# Patient Record
Sex: Female | Born: 1945
Health system: Southern US, Community
[De-identification: ages and names within clinical notes are randomized; demographics above are authoritative.]

## PROBLEM LIST (undated history)

## (undated) DIAGNOSIS — Z8049 Family history of malignant neoplasm of other genital organs: Secondary | ICD-10-CM

## (undated) DIAGNOSIS — S060X9A Concussion with loss of consciousness of unspecified duration, initial encounter: Secondary | ICD-10-CM

## (undated) DIAGNOSIS — Z803 Family history of malignant neoplasm of breast: Secondary | ICD-10-CM

## (undated) DIAGNOSIS — Z8041 Family history of malignant neoplasm of ovary: Secondary | ICD-10-CM

## (undated) DIAGNOSIS — R569 Unspecified convulsions: Secondary | ICD-10-CM

## (undated) DIAGNOSIS — Z800 Family history of malignant neoplasm of digestive organs: Secondary | ICD-10-CM

## (undated) DIAGNOSIS — Z8051 Family history of malignant neoplasm of kidney: Secondary | ICD-10-CM

## (undated) DIAGNOSIS — Z789 Other specified health status: Secondary | ICD-10-CM

## (undated) DIAGNOSIS — C801 Malignant (primary) neoplasm, unspecified: Secondary | ICD-10-CM

## (undated) DIAGNOSIS — E7800 Pure hypercholesterolemia, unspecified: Secondary | ICD-10-CM

## (undated) DIAGNOSIS — S060XAA Concussion with loss of consciousness status unknown, initial encounter: Secondary | ICD-10-CM

## (undated) HISTORY — DX: Family history of malignant neoplasm of ovary: Z80.41

## (undated) HISTORY — DX: Family history of malignant neoplasm of other genital organs: Z80.49

## (undated) HISTORY — PX: COLONOSCOPY: SHX174

## (undated) HISTORY — DX: Concussion with loss of consciousness status unknown, initial encounter: S06.0XAA

## (undated) HISTORY — DX: Family history of malignant neoplasm of kidney: Z80.51

## (undated) HISTORY — PX: CHOLECYSTECTOMY: SHX55

## (undated) HISTORY — DX: Family history of malignant neoplasm of digestive organs: Z80.0

## (undated) HISTORY — DX: Pure hypercholesterolemia, unspecified: E78.00

## (undated) HISTORY — DX: Malignant (primary) neoplasm, unspecified: C80.1

## (undated) HISTORY — DX: Family history of malignant neoplasm of breast: Z80.3

## (undated) HISTORY — DX: Concussion with loss of consciousness of unspecified duration, initial encounter: S06.0X9A

---

## 2001-01-29 ENCOUNTER — Encounter: Payer: Self-pay | Admitting: *Deleted

## 2001-01-29 ENCOUNTER — Emergency Department (HOSPITAL_COMMUNITY): Admission: EM | Admit: 2001-01-29 | Discharge: 2001-01-29 | Payer: Self-pay | Admitting: *Deleted

## 2001-10-07 ENCOUNTER — Other Ambulatory Visit: Admission: RE | Admit: 2001-10-07 | Discharge: 2001-10-07 | Payer: Self-pay | Admitting: Internal Medicine

## 2001-10-23 ENCOUNTER — Encounter: Payer: Self-pay | Admitting: Internal Medicine

## 2001-10-23 ENCOUNTER — Ambulatory Visit (HOSPITAL_COMMUNITY): Admission: RE | Admit: 2001-10-23 | Discharge: 2001-10-23 | Payer: Self-pay | Admitting: Internal Medicine

## 2001-11-13 ENCOUNTER — Ambulatory Visit (HOSPITAL_COMMUNITY): Admission: RE | Admit: 2001-11-13 | Discharge: 2001-11-13 | Payer: Self-pay | Admitting: Internal Medicine

## 2001-11-13 ENCOUNTER — Encounter: Payer: Self-pay | Admitting: Internal Medicine

## 2002-10-26 ENCOUNTER — Ambulatory Visit (HOSPITAL_COMMUNITY): Admission: RE | Admit: 2002-10-26 | Discharge: 2002-10-26 | Payer: Self-pay | Admitting: Internal Medicine

## 2002-10-26 ENCOUNTER — Encounter: Payer: Self-pay | Admitting: Internal Medicine

## 2003-11-08 ENCOUNTER — Encounter: Admission: RE | Admit: 2003-11-08 | Discharge: 2003-11-08 | Payer: Self-pay | Admitting: Family Medicine

## 2004-10-27 ENCOUNTER — Ambulatory Visit (HOSPITAL_COMMUNITY): Admission: RE | Admit: 2004-10-27 | Discharge: 2004-10-27 | Payer: Self-pay | Admitting: Family Medicine

## 2004-11-30 ENCOUNTER — Ambulatory Visit (HOSPITAL_COMMUNITY): Admission: RE | Admit: 2004-11-30 | Discharge: 2004-11-30 | Payer: Self-pay | Admitting: General Surgery

## 2005-05-10 ENCOUNTER — Ambulatory Visit (HOSPITAL_COMMUNITY): Admission: RE | Admit: 2005-05-10 | Discharge: 2005-05-10 | Payer: Self-pay | Admitting: General Surgery

## 2007-06-12 ENCOUNTER — Ambulatory Visit (HOSPITAL_COMMUNITY): Admission: RE | Admit: 2007-06-12 | Discharge: 2007-06-12 | Payer: Self-pay | Admitting: Family Medicine

## 2008-06-21 ENCOUNTER — Ambulatory Visit (HOSPITAL_COMMUNITY): Admission: RE | Admit: 2008-06-21 | Discharge: 2008-06-21 | Payer: Self-pay | Admitting: Internal Medicine

## 2010-10-16 ENCOUNTER — Encounter: Payer: Self-pay | Admitting: Internal Medicine

## 2011-02-09 NOTE — H&P (Signed)
Darlene Moore, GRITZ              ACCOUNT NO.:  [id number]   MEDICAL RECORD NO.:  [id number]          PATIENT TYPE:  AMB   LOCATION:  DAY                           FACILITY:  [facility]   PHYSICIAN:  Jerolyn Shin C. Katrinka Blazing, M.D.   DATE OF BIRTH:  30-Aug-1946   DATE OF ADMISSION:  DATE OF DISCHARGE:  LH                                HISTORY & PHYSICAL   HISTORY OF PRESENT ILLNESS:  A 65 year old female with a history of guaiac  positive stool on physical exam.  There is no history of rectal bleeding.  She denies constipation.  There is no family history of colon cancer.   PAST HISTORY:  Negative, except for seasonal allergies.   SURGERY:  Cholecystectomy for gangrenous cholecystitis.   MEDICATIONS:  None.   FAMILY HISTORY:  Positive for breast cancer, atherosclerotic heart disease,  and some type of intestinal cancer.   PHYSICAL EXAMINATION:  VITAL SIGNS:  Blood pressure 110/80, pulse 68,  respirations 18, weight 206 pounds.  HEENT:  Unremarkable.  NECK:  Supple.  No JVD, bruit, adenopathy, or thyromegaly.  CHEST:  Clear to auscultation.  HEART:  Regular rate and rhythm without murmur, gallop, or rub.  ABDOMEN:  Soft, nontender.  No masses.  RECTAL:  Normal external and internal exam.  Stool guaiac positive.  EXTREMITIES:  No clubbing, cyanosis, or edema.  NEUROLOGIC:  No focal motor, sensory, or cerebellar deficit.   IMPRESSION:  1.  Guaiac positive stools.  2.  Family history of intestinal cancer, type undefined.   PLAN:  Screening colonoscopy.      LCS/MEDQ  D:  11/30/2004  T:  11/30/2004  Job:  [id number]

## 2012-05-07 ENCOUNTER — Other Ambulatory Visit (HOSPITAL_COMMUNITY): Payer: Self-pay | Admitting: Internal Medicine

## 2012-05-07 DIAGNOSIS — Z139 Encounter for screening, unspecified: Secondary | ICD-10-CM

## 2012-05-13 ENCOUNTER — Ambulatory Visit (HOSPITAL_COMMUNITY)
Admission: RE | Admit: 2012-05-13 | Discharge: 2012-05-13 | Disposition: A | Discharge status: Home / Self Care | Payer: BC Managed Care – PPO | Source: Ambulatory Visit | Attending: Internal Medicine | Admitting: Internal Medicine

## 2012-05-13 DIAGNOSIS — Z139 Encounter for screening, unspecified: Secondary | ICD-10-CM

## 2012-05-13 DIAGNOSIS — Z1231 Encounter for screening mammogram for malignant neoplasm of breast: Secondary | ICD-10-CM | POA: Insufficient documentation

## 2012-05-21 ENCOUNTER — Ambulatory Visit: Payer: Self-pay | Admitting: Cardiology

## 2013-06-25 ENCOUNTER — Other Ambulatory Visit (HOSPITAL_COMMUNITY): Payer: Self-pay | Admitting: Internal Medicine

## 2013-06-25 DIAGNOSIS — Z139 Encounter for screening, unspecified: Secondary | ICD-10-CM

## 2013-06-29 ENCOUNTER — Ambulatory Visit (HOSPITAL_COMMUNITY)
Admission: RE | Admit: 2013-06-29 | Discharge: 2013-06-29 | Disposition: A | Discharge status: Home / Self Care | Payer: Medicare Other | Source: Ambulatory Visit | Attending: Internal Medicine | Admitting: Internal Medicine

## 2013-06-29 DIAGNOSIS — Z139 Encounter for screening, unspecified: Secondary | ICD-10-CM

## 2013-06-29 DIAGNOSIS — Z1231 Encounter for screening mammogram for malignant neoplasm of breast: Secondary | ICD-10-CM | POA: Insufficient documentation

## 2013-12-15 DIAGNOSIS — H6990 Unspecified Eustachian tube disorder, unspecified ear: Secondary | ICD-10-CM | POA: Insufficient documentation

## 2014-05-26 ENCOUNTER — Other Ambulatory Visit (HOSPITAL_COMMUNITY): Payer: Self-pay | Admitting: Internal Medicine

## 2014-05-26 DIAGNOSIS — Z1231 Encounter for screening mammogram for malignant neoplasm of breast: Secondary | ICD-10-CM

## 2014-07-05 ENCOUNTER — Ambulatory Visit (HOSPITAL_COMMUNITY)
Admission: RE | Admit: 2014-07-05 | Discharge: 2014-07-05 | Disposition: A | Discharge status: Home / Self Care | Payer: Medicare Other | Source: Ambulatory Visit | Attending: Internal Medicine | Admitting: Internal Medicine

## 2014-07-05 DIAGNOSIS — Z1231 Encounter for screening mammogram for malignant neoplasm of breast: Secondary | ICD-10-CM | POA: Diagnosis present

## 2014-09-02 ENCOUNTER — Encounter (INDEPENDENT_AMBULATORY_CARE_PROVIDER_SITE_OTHER): Payer: Self-pay | Admitting: *Deleted

## 2015-06-20 ENCOUNTER — Other Ambulatory Visit (HOSPITAL_COMMUNITY): Payer: Self-pay | Admitting: Internal Medicine

## 2015-06-20 DIAGNOSIS — Z1231 Encounter for screening mammogram for malignant neoplasm of breast: Secondary | ICD-10-CM

## 2015-06-29 ENCOUNTER — Encounter (INDEPENDENT_AMBULATORY_CARE_PROVIDER_SITE_OTHER): Payer: Self-pay | Admitting: *Deleted

## 2015-07-13 ENCOUNTER — Ambulatory Visit (HOSPITAL_COMMUNITY): Payer: Self-pay

## 2015-07-14 ENCOUNTER — Other Ambulatory Visit (HOSPITAL_COMMUNITY): Payer: Self-pay | Admitting: Internal Medicine

## 2015-07-14 DIAGNOSIS — Z1231 Encounter for screening mammogram for malignant neoplasm of breast: Secondary | ICD-10-CM

## 2015-07-15 ENCOUNTER — Encounter (INDEPENDENT_AMBULATORY_CARE_PROVIDER_SITE_OTHER): Payer: Self-pay | Admitting: *Deleted

## 2015-07-18 ENCOUNTER — Ambulatory Visit (HOSPITAL_COMMUNITY)
Admission: RE | Admit: 2015-07-18 | Discharge: 2015-07-18 | Disposition: A | Discharge status: Home / Self Care | Payer: Medicare Other | Source: Ambulatory Visit | Attending: Internal Medicine | Admitting: Internal Medicine

## 2015-07-18 DIAGNOSIS — M8580 Other specified disorders of bone density and structure, unspecified site: Secondary | ICD-10-CM | POA: Insufficient documentation

## 2015-07-18 DIAGNOSIS — Z780 Asymptomatic menopausal state: Secondary | ICD-10-CM | POA: Insufficient documentation

## 2015-07-18 DIAGNOSIS — Z1231 Encounter for screening mammogram for malignant neoplasm of breast: Secondary | ICD-10-CM | POA: Insufficient documentation

## 2015-07-18 DIAGNOSIS — Z13820 Encounter for screening for osteoporosis: Secondary | ICD-10-CM | POA: Insufficient documentation

## 2015-07-21 ENCOUNTER — Other Ambulatory Visit (INDEPENDENT_AMBULATORY_CARE_PROVIDER_SITE_OTHER): Payer: Self-pay | Admitting: *Deleted

## 2015-07-21 ENCOUNTER — Encounter (INDEPENDENT_AMBULATORY_CARE_PROVIDER_SITE_OTHER): Payer: Self-pay | Admitting: *Deleted

## 2015-07-21 DIAGNOSIS — Z1211 Encounter for screening for malignant neoplasm of colon: Secondary | ICD-10-CM

## 2015-08-02 ENCOUNTER — Ambulatory Visit: Payer: Self-pay | Admitting: Allergy and Immunology

## 2015-08-31 ENCOUNTER — Telehealth (INDEPENDENT_AMBULATORY_CARE_PROVIDER_SITE_OTHER): Payer: Self-pay | Admitting: *Deleted

## 2015-08-31 DIAGNOSIS — Z1211 Encounter for screening for malignant neoplasm of colon: Secondary | ICD-10-CM

## 2015-08-31 MED ORDER — PEG 3350-KCL-NA BICARB-NACL 420 G PO SOLR: 4000.0000 mL | Freq: Once | ORAL | Status: DC

## 2015-08-31 NOTE — Telephone Encounter (Signed)
Referring MD/PCP: hall   Procedure: tcs  Reason/Indication:  wcreening  Has patient had this procedure before?  Yes, 10 yrs ago  If so, when, by whom and where?    Is there a family history of colon cancer?  no  Who?  What age when diagnosed?    Is patient diabetic?   no      Does patient have prosthetic heart valve or mechanical valve?  no  Do you have a pacemaker?  no  Has patient ever had endocarditis? no  Has patient had joint replacement within last 12 months?  no  Does patient tend to be constipated or take laxatives? no  Does patient have a history of alcohol/drug use?  no  Is patient on Coumadin, Plavix and/or Aspirin? no  Medications: advil nightly, multi vit, probiotic  Allergies: nkda  Medication Adjustment:   Procedure date & time: 09/29/15 at 730

## 2015-08-31 NOTE — Telephone Encounter (Signed)
Patient needs trilyte 

## 2015-08-31 NOTE — Telephone Encounter (Signed)
agree

## 2015-09-29 ENCOUNTER — Ambulatory Visit (HOSPITAL_COMMUNITY)
Admission: RE | Admit: 2015-09-29 | Discharge: 2015-09-29 | Disposition: A | Discharge status: Home / Self Care | Payer: Medicare Other | Source: Ambulatory Visit | Attending: Internal Medicine | Admitting: Internal Medicine

## 2015-09-29 ENCOUNTER — Encounter (HOSPITAL_COMMUNITY): Payer: Self-pay | Admitting: *Deleted

## 2015-09-29 ENCOUNTER — Encounter (HOSPITAL_COMMUNITY)
Admission: RE | Disposition: A | Discharge status: Home / Self Care | Payer: Self-pay | Source: Ambulatory Visit | Attending: Internal Medicine

## 2015-09-29 DIAGNOSIS — K644 Residual hemorrhoidal skin tags: Secondary | ICD-10-CM | POA: Insufficient documentation

## 2015-09-29 DIAGNOSIS — K648 Other hemorrhoids: Secondary | ICD-10-CM | POA: Diagnosis not present

## 2015-09-29 DIAGNOSIS — Z1211 Encounter for screening for malignant neoplasm of colon: Secondary | ICD-10-CM | POA: Insufficient documentation

## 2015-09-29 HISTORY — DX: Other specified health status: Z78.9

## 2015-09-29 HISTORY — PX: COLONOSCOPY: SHX5424

## 2015-09-29 SURGERY — COLONOSCOPY
Anesthesia: Moderate Sedation

## 2015-09-29 MED ORDER — SODIUM CHLORIDE 0.9 % IV SOLN
INTRAVENOUS | Status: DC
  Administered 2015-09-29: 08:00:00 via INTRAVENOUS

## 2015-09-29 MED ORDER — MIDAZOLAM HCL 5 MG/5ML IJ SOLN
INTRAMUSCULAR | Status: DC | PRN
  Administered 2015-09-29: 2 mg via INTRAVENOUS
  Administered 2015-09-29: 1 mg via INTRAVENOUS
  Administered 2015-09-29: 2 mg via INTRAVENOUS

## 2015-09-29 MED ORDER — MEPERIDINE HCL 50 MG/ML IJ SOLN
INTRAMUSCULAR | Status: DC | PRN
  Administered 2015-09-29 (×2): 25 mg via INTRAVENOUS

## 2015-09-29 MED ORDER — MEPERIDINE HCL 50 MG/ML IJ SOLN
INTRAMUSCULAR | Status: AC
  Filled 2015-09-29: qty 1

## 2015-09-29 MED ORDER — MIDAZOLAM HCL 5 MG/5ML IJ SOLN
INTRAMUSCULAR | Status: AC
  Filled 2015-09-29: qty 10

## 2015-09-29 MED ORDER — STERILE WATER FOR IRRIGATION IR SOLN
Status: DC | PRN
  Administered 2015-09-29: 09:00:00

## 2015-09-29 NOTE — Op Note (Addendum)
COLONOSCOPY PROCEDURE REPORT  PATIENT:  Darlene Moore  MR#:  [id number] Birthdate:  Oct 20, 1945, 70 y.o., female Endoscopist:  Dr. Rogene Houston, MD Referred By:  Dr. Delphina Cahill, MD Procedure Date: 09/29/2015  Procedure:   Colonoscopy  Indications:  Average risk screening colonoscopy.                        Last exam was normal 10 years ago.  Informed Consent:  The procedure and risks were reviewed with the patient and informed consent was obtained.  Medications:  Demerol 50 mg IV Versed 5 mg IV Moderate sedation started @8 :57 A.M. Procedure ended and scope out 9:21 A.M.  Description of procedure:  After a digital rectal exam was performed, that colonoscope was advanced from the anus through the rectum and colon to the area of the cecum, ileocecal valve and appendiceal orifice. The cecum was deeply intubated. These structures were well-seen and photographed for the record. From the level of the cecum and ileocecal valve, the scope was slowly and cautiously withdrawn. The mucosal surfaces were carefully surveyed utilizing scope tip to flexion to facilitate fold flattening as needed. The scope was pulled down into the rectum where a thorough exam including retroflexion was performed.  Findings:   Prep satisfactory. Normal mucosa of cecum, ascending colon, hepatic flexure, transverse colon, splenic flexure, descending and sigmoid colon. Normal rectal mucosa. Wall hemorrhoids below the dentate line.   Therapeutic/Diagnostic Maneuvers Performed:   None  Complications:  None  EBL: None  Cecal Withdrawal Time:  8 minutes  Impression:  Normal colonoscopy except small external hemorrhoids.  Recommendations:  Standard instructions given. Next screening exam in 10 years.  Darlene Moore,Darlene Moore  09/29/2015 9:27 AM  CC: Dr. Wende Neighbors, MD & Dr. Rayne Du ref. provider found

## 2015-09-29 NOTE — Discharge Instructions (Signed)
Resume usual medications and diet. °No driving for 24 hours. °Next screening exam in 10 years. ° ° ° ° °Colonoscopy, Care After °These instructions give you information on caring for yourself after your procedure. Your doctor may also give you more specific instructions. Call your doctor if you have any problems or questions after your procedure. °HOME CARE °· Do not drive for 24 hours. °· Do not sign important papers or use machinery for 24 hours. °· You may shower. °· You may go back to your usual activities, but go slower for the first 24 hours. °· Take rest breaks often during the first 24 hours. °· Walk around or use warm packs on your belly (abdomen) if you have belly cramping or gas. °· Drink enough fluids to keep your pee (urine) clear or pale yellow. °· Resume your normal diet. Avoid heavy or fried foods. °· Avoid drinking alcohol for 24 hours or as told by your doctor. °· Only take medicines as told by your doctor. °If a tissue sample (biopsy) was taken during the procedure:  °· Do not take aspirin or blood thinners for 7 days, or as told by your doctor. °· Do not drink alcohol for 7 days, or as told by your doctor. °· Eat soft foods for the first 24 hours. °GET HELP IF: °You still have a small amount of blood in your poop (stool) 2-3 days after the procedure. °GET HELP RIGHT AWAY IF: °· You have more than a small amount of blood in your poop. °· You see clumps of tissue (blood clots) in your poop. °· Your belly is puffy (swollen). °· You feel sick to your stomach (nauseous) or throw up (vomit). °· You have a fever. °· You have belly pain that gets worse and medicine does not help. °MAKE SURE YOU: °· Understand these instructions. °· Will watch your condition. °· Will get help right away if you are not doing well or get worse. °  °This information is not intended to replace advice given to you by your health care provider. Make sure you discuss any questions you have with your health care provider. °    °Document Released: 10/13/2010 Document Revised: 09/15/2013 Document Reviewed: 05/18/2013 °Elsevier Interactive Patient Education ©2016 [employer] ° ° ° ° ° °Hemorrhoids °Hemorrhoids are swollen veins around the rectum or anus. There are two types of hemorrhoids:  °· Internal hemorrhoids. These occur in the veins just inside the rectum. They may poke through to the outside and become irritated and painful. °· External hemorrhoids. These occur in the veins outside the anus and can be felt as a painful swelling or hard lump near the anus. °CAUSES °· Pregnancy.   °· Obesity.   °· Constipation or diarrhea.   °· Straining to have a bowel movement.   °· Sitting for long periods on the toilet. °· Heavy lifting or other activity that caused you to strain. °· Anal intercourse. °SYMPTOMS  °· Pain.   °· Anal itching or irritation.   °· Rectal bleeding.   °· Fecal leakage.   °· Anal swelling.   °· One or more lumps around the anus.   °DIAGNOSIS  °Your caregiver may be able to diagnose hemorrhoids by visual examination. Other examinations or tests that may be performed include:  °· Examination of the rectal area with a gloved hand (digital rectal exam).   °· Examination of anal canal using a small tube (scope).   °· A blood test if you have lost a significant amount of blood. °· A test to look inside the colon (  sigmoidoscopy or colonoscopy). °TREATMENT °Most hemorrhoids can be treated at home. However, if symptoms do not seem to be getting better or if you have a lot of rectal bleeding, your caregiver may perform a procedure to help make the hemorrhoids get smaller or remove them completely. Possible treatments include:  °· Placing a rubber band at the base of the hemorrhoid to cut off the circulation (rubber band ligation).   °· Injecting a chemical to shrink the hemorrhoid (sclerotherapy).   °· Using a tool to burn the hemorrhoid (infrared light therapy).   °· Surgically removing the hemorrhoid (hemorrhoidectomy).    °· Stapling the hemorrhoid to block blood flow to the tissue (hemorrhoid stapling).   °HOME CARE INSTRUCTIONS  °· Eat foods with fiber, such as whole grains, beans, nuts, fruits, and vegetables. Ask your doctor about taking products with added fiber in them (fiber supplements). °· Increase fluid intake. Drink enough water and fluids to keep your urine clear or pale yellow.   °· Exercise regularly.   °· Go to the bathroom when you have the urge to have a bowel movement. Do not wait.   °· Avoid straining to have bowel movements.   °· Keep the anal area dry and clean. Use wet toilet paper or moist towelettes after a bowel movement.   °· Medicated creams and suppositories may be used or applied as directed.   °· Only take over-the-counter or prescription medicines as directed by your caregiver.   °· Take warm sitz baths for 15-20 minutes, 3-4 times a day to ease pain and discomfort.   °· Place ice packs on the hemorrhoids if they are tender and swollen. Using ice packs between sitz baths may be helpful.   °¨ Put ice in a plastic bag.   °¨ Place a towel between your skin and the bag.   °¨ Leave the ice on for 15-20 minutes, 3-4 times a day.   °· Do not use a donut-shaped pillow or sit on the toilet for long periods. This increases blood pooling and pain.   °SEEK MEDICAL CARE IF: °· You have increasing pain and swelling that is not controlled by treatment or medicine. °· You have uncontrolled bleeding. °· You have difficulty or you are unable to have a bowel movement. °· You have pain or inflammation outside the area of the hemorrhoids. °MAKE SURE YOU: °· Understand these instructions. °· Will watch your condition. °· Will get help right away if you are not doing well or get worse. °  °This information is not intended to replace advice given to you by your health care provider. Make sure you discuss any questions you have with your health care provider. °  °Document Released: 09/07/2000 Document Revised: 08/27/2012  Document Reviewed: 07/15/2012 °Elsevier Interactive Patient Education ©2016 [employer] ° °

## 2015-09-29 NOTE — H&P (Signed)
Darlene Moore is an 70 y.o. female.   Chief Complaint: Patient is here for colonoscopy. HPI: Patient is 70 year old Caucasian female who is here for screening colonoscopy. She denies abdominal pain change in bowel habits or rectal bleeding. Last colonoscopy was normal 10 years ago. Family history is negative for CRC.  Past Medical History  Diagnosis Date  . Medical history non-contributory     Past Surgical History  Procedure Laterality Date  . Cholecystectomy    . Colonoscopy      History reviewed. No pertinent family history. Social History:  reports that she has never smoked. She does not have any smokeless tobacco history on file. She reports that she does not drink alcohol or use illicit drugs.  Allergies: No Known Allergies  Medications Prior to Admission  Medication Sig Dispense Refill  . ibuprofen (ADVIL,MOTRIN) 200 MG tablet Take 200 mg by mouth every 6 (six) hours as needed for mild pain.    . Multiple Vitamin (MULTIVITAMIN) tablet Take 1 tablet by mouth daily.    . polyethylene glycol-electrolytes (NULYTELY/GOLYTELY) 420 G solution Take 4,000 mLs by mouth once. 4000 mL 0  . Probiotic Product (PROBIOTIC PO) Take 1 capsule by mouth daily.      No results found for this or any previous visit (from the past 48 hour(s)). No results found.  ROS  Blood pressure 138/65, pulse 57, temperature 97.9 F (36.6 C), temperature source Oral, resp. rate 15, height 5\' 7"  (1.702 m), weight 214 lb (97.07 kg), SpO2 98 %. Physical Exam  Constitutional: She appears well-developed and well-nourished.  HENT:  Mouth/Throat: Oropharynx is clear and moist.  Eyes: Conjunctivae are normal. No scleral icterus.  Neck: No thyromegaly present.  Cardiovascular: Normal rate, regular rhythm and normal heart sounds.   No murmur heard. Respiratory: Effort normal and breath sounds normal.  GI: Soft. She exhibits no distension and no mass. There is no tenderness.  Musculoskeletal: She exhibits no  edema.  Lymphadenopathy:    She has no cervical adenopathy.  Neurological: She is alert.  Skin: Skin is warm and dry.     Assessment/Plan Average risk screening colonoscopy.  REHMAN,NAJEEB U 09/29/2015, 8:53 AM

## 2015-10-03 ENCOUNTER — Encounter (HOSPITAL_COMMUNITY): Payer: Self-pay | Admitting: Internal Medicine

## 2016-01-17 ENCOUNTER — Other Ambulatory Visit: Payer: Self-pay | Admitting: Neurology

## 2016-01-17 DIAGNOSIS — R569 Unspecified convulsions: Secondary | ICD-10-CM

## 2016-01-25 ENCOUNTER — Ambulatory Visit (HOSPITAL_COMMUNITY)
Admission: RE | Admit: 2016-01-25 | Discharge: 2016-01-25 | Disposition: A | Discharge status: Home / Self Care | Payer: Medicare Other | Source: Ambulatory Visit | Attending: Neurology | Admitting: Neurology

## 2016-01-25 DIAGNOSIS — R569 Unspecified convulsions: Secondary | ICD-10-CM | POA: Insufficient documentation

## 2016-01-25 DIAGNOSIS — R9089 Other abnormal findings on diagnostic imaging of central nervous system: Secondary | ICD-10-CM | POA: Insufficient documentation

## 2016-01-25 MED ORDER — GADOBENATE DIMEGLUMINE 529 MG/ML IV SOLN
20.0000 mL | Freq: Once | INTRAVENOUS | Status: AC | PRN
  Administered 2016-01-25: 20 mL via INTRAVENOUS

## 2016-06-26 ENCOUNTER — Other Ambulatory Visit (HOSPITAL_COMMUNITY): Payer: Self-pay | Admitting: Internal Medicine

## 2016-06-26 DIAGNOSIS — Z1231 Encounter for screening mammogram for malignant neoplasm of breast: Secondary | ICD-10-CM

## 2016-07-19 ENCOUNTER — Ambulatory Visit (HOSPITAL_COMMUNITY)
Admission: RE | Admit: 2016-07-19 | Discharge: 2016-07-19 | Disposition: A | Discharge status: Home / Self Care | Payer: Medicare Other | Source: Ambulatory Visit | Attending: Internal Medicine | Admitting: Internal Medicine

## 2016-07-19 DIAGNOSIS — Z1231 Encounter for screening mammogram for malignant neoplasm of breast: Secondary | ICD-10-CM | POA: Insufficient documentation

## 2017-05-07 ENCOUNTER — Other Ambulatory Visit: Payer: Self-pay | Admitting: Neurology

## 2017-05-07 DIAGNOSIS — G459 Transient cerebral ischemic attack, unspecified: Secondary | ICD-10-CM

## 2017-05-22 ENCOUNTER — Ambulatory Visit (HOSPITAL_COMMUNITY)
Admission: RE | Admit: 2017-05-22 | Discharge: 2017-05-22 | Disposition: A | Discharge status: Home / Self Care | Payer: Medicare Other | Source: Ambulatory Visit | Attending: Neurology | Admitting: Neurology

## 2017-05-22 DIAGNOSIS — I6523 Occlusion and stenosis of bilateral carotid arteries: Secondary | ICD-10-CM | POA: Insufficient documentation

## 2017-05-22 DIAGNOSIS — G459 Transient cerebral ischemic attack, unspecified: Secondary | ICD-10-CM | POA: Diagnosis present

## 2017-06-06 ENCOUNTER — Other Ambulatory Visit (HOSPITAL_COMMUNITY): Payer: Self-pay | Admitting: Internal Medicine

## 2017-06-06 DIAGNOSIS — Z1231 Encounter for screening mammogram for malignant neoplasm of breast: Secondary | ICD-10-CM

## 2017-07-22 ENCOUNTER — Ambulatory Visit (HOSPITAL_COMMUNITY)
Admission: RE | Admit: 2017-07-22 | Discharge: 2017-07-22 | Disposition: A | Discharge status: Home / Self Care | Payer: Medicare Other | Source: Ambulatory Visit | Attending: Internal Medicine | Admitting: Internal Medicine

## 2017-07-22 ENCOUNTER — Encounter (HOSPITAL_COMMUNITY): Payer: Self-pay

## 2017-07-22 DIAGNOSIS — Z1231 Encounter for screening mammogram for malignant neoplasm of breast: Secondary | ICD-10-CM | POA: Diagnosis not present

## 2017-07-27 ENCOUNTER — Encounter (HOSPITAL_COMMUNITY): Payer: Self-pay | Admitting: Emergency Medicine

## 2017-07-27 ENCOUNTER — Emergency Department (HOSPITAL_COMMUNITY): Payer: No Typology Code available for payment source

## 2017-07-27 ENCOUNTER — Emergency Department (HOSPITAL_COMMUNITY)
Admission: EM | Admit: 2017-07-27 | Discharge: 2017-07-27 | Disposition: A | Discharge status: Home / Self Care | Payer: No Typology Code available for payment source | Attending: Emergency Medicine | Admitting: Emergency Medicine

## 2017-07-27 DIAGNOSIS — R51 Headache: Secondary | ICD-10-CM | POA: Insufficient documentation

## 2017-07-27 DIAGNOSIS — M542 Cervicalgia: Secondary | ICD-10-CM

## 2017-07-27 DIAGNOSIS — Y999 Unspecified external cause status: Secondary | ICD-10-CM | POA: Insufficient documentation

## 2017-07-27 DIAGNOSIS — M545 Low back pain, unspecified: Secondary | ICD-10-CM

## 2017-07-27 DIAGNOSIS — S129XXA Fracture of neck, unspecified, initial encounter: Secondary | ICD-10-CM | POA: Insufficient documentation

## 2017-07-27 DIAGNOSIS — S199XXA Unspecified injury of neck, initial encounter: Secondary | ICD-10-CM | POA: Diagnosis present

## 2017-07-27 DIAGNOSIS — Z79899 Other long term (current) drug therapy: Secondary | ICD-10-CM | POA: Insufficient documentation

## 2017-07-27 DIAGNOSIS — Y92410 Unspecified street and highway as the place of occurrence of the external cause: Secondary | ICD-10-CM | POA: Insufficient documentation

## 2017-07-27 DIAGNOSIS — Y9389 Activity, other specified: Secondary | ICD-10-CM | POA: Diagnosis not present

## 2017-07-27 HISTORY — DX: Unspecified convulsions: R56.9

## 2017-07-27 LAB — CBC
HCT: 42.1 % (ref 36.0–46.0)
HEMOGLOBIN: 14.1 g/dL (ref 12.0–15.0)
MCH: 30.7 pg (ref 26.0–34.0)
MCHC: 33.5 g/dL (ref 30.0–36.0)
MCV: 91.7 fL (ref 78.0–100.0)
Platelets: 168 10*3/uL (ref 150–400)
RBC: 4.59 MIL/uL (ref 3.87–5.11)
RDW: 12.7 % (ref 11.5–15.5)
WBC: 7.8 10*3/uL (ref 4.0–10.5)

## 2017-07-27 LAB — BASIC METABOLIC PANEL
Anion gap: 9 (ref 5–15)
BUN: 23 mg/dL — AB (ref 6–20)
CALCIUM: 9.6 mg/dL (ref 8.9–10.3)
CHLORIDE: 104 mmol/L (ref 101–111)
CO2: 28 mmol/L (ref 22–32)
CREATININE: 0.88 mg/dL (ref 0.44–1.00)
GFR calc Af Amer: >60 mL/min (ref >60)
GFR calc non Af Amer: >60 mL/min (ref >60)
GLUCOSE: 94 mg/dL (ref 65–99)
Potassium: 4.1 mmol/L (ref 3.5–5.1)
Sodium: 141 mmol/L (ref 135–145)

## 2017-07-27 MED ORDER — IOPAMIDOL (ISOVUE-300) INJECTION 61%
100.0000 mL | Freq: Once | INTRAVENOUS | Status: AC | PRN
  Administered 2017-07-27: 100 mL via INTRAVENOUS

## 2017-07-27 MED ORDER — SODIUM CHLORIDE 0.9 % IV BOLUS (SEPSIS)
250.0000 mL | Freq: Once | INTRAVENOUS | Status: AC
  Administered 2017-07-27: 250 mL via INTRAVENOUS

## 2017-07-27 MED ORDER — HYDROCODONE-ACETAMINOPHEN 5-325 MG PO TABS: 1.0000 | ORAL_TABLET | Freq: Four times a day (QID) | ORAL | 0 refills | Status: DC | PRN

## 2017-07-27 MED ORDER — SODIUM CHLORIDE 0.9 % IV SOLN: INTRAVENOUS | Status: DC

## 2017-07-27 NOTE — Discharge Instructions (Signed)
Follow-up with orthopedics.  Take pain medicine as needed.  Expect to be sore and stiff the next 2 days if not longer.  CT of head neck chest abdomen and pelvis only with the finding of the cervical transverse process nondisplaced fracture.  This should be a stable fracture and should heal.
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
## 2017-08-06 ENCOUNTER — Ambulatory Visit (INDEPENDENT_AMBULATORY_CARE_PROVIDER_SITE_OTHER): Payer: Medicare Other

## 2017-08-06 ENCOUNTER — Ambulatory Visit: Payer: Medicare Other | Admitting: Orthopedic Surgery

## 2017-08-06 ENCOUNTER — Encounter: Payer: Self-pay | Admitting: Orthopedic Surgery

## 2017-08-06 ENCOUNTER — Other Ambulatory Visit: Payer: Self-pay | Admitting: Orthopedic Surgery

## 2017-08-06 VITALS — BP 122/80 | HR 55 | Ht 67.0 in | Wt 196.0 lb

## 2017-08-06 DIAGNOSIS — S12501A Unspecified nondisplaced fracture of sixth cervical vertebra, initial encounter for closed fracture: Secondary | ICD-10-CM

## 2017-08-06 MED ORDER — HYDROCODONE-ACETAMINOPHEN 5-325 MG PO TABS: 1.0000 | ORAL_TABLET | Freq: Four times a day (QID) | ORAL | 0 refills | Status: DC | PRN
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
## 2017-08-21 ENCOUNTER — Other Ambulatory Visit: Payer: Self-pay | Admitting: Neurology

## 2017-08-21 DIAGNOSIS — G459 Transient cerebral ischemic attack, unspecified: Secondary | ICD-10-CM

## 2017-08-28 ENCOUNTER — Ambulatory Visit (HOSPITAL_COMMUNITY)
Admission: RE | Admit: 2017-08-28 | Discharge: 2017-08-28 | Disposition: A | Discharge status: Home / Self Care | Payer: Medicare Other | Source: Ambulatory Visit | Attending: Neurology | Admitting: Neurology

## 2017-08-28 DIAGNOSIS — G459 Transient cerebral ischemic attack, unspecified: Secondary | ICD-10-CM

## 2017-09-18 DIAGNOSIS — S12501D Unspecified nondisplaced fracture of sixth cervical vertebra, subsequent encounter for fracture with routine healing: Secondary | ICD-10-CM | POA: Insufficient documentation

## 2017-09-25 ENCOUNTER — Ambulatory Visit: Payer: Medicare Other | Admitting: Orthopedic Surgery

## 2017-11-18 DIAGNOSIS — G40219 Localization-related (focal) (partial) symptomatic epilepsy and epileptic syndromes with complex partial seizures, intractable, without status epilepticus: Secondary | ICD-10-CM | POA: Diagnosis not present

## 2017-11-18 DIAGNOSIS — H9193 Unspecified hearing loss, bilateral: Secondary | ICD-10-CM | POA: Diagnosis not present

## 2017-11-18 DIAGNOSIS — G4714 Hypersomnia due to medical condition: Secondary | ICD-10-CM | POA: Diagnosis not present

## 2017-11-18 DIAGNOSIS — Z79899 Other long term (current) drug therapy: Secondary | ICD-10-CM | POA: Diagnosis not present

## 2017-11-21 ENCOUNTER — Other Ambulatory Visit (HOSPITAL_COMMUNITY): Payer: Self-pay | Admitting: Internal Medicine

## 2017-11-21 DIAGNOSIS — Z780 Asymptomatic menopausal state: Secondary | ICD-10-CM

## 2017-12-09 ENCOUNTER — Encounter (HOSPITAL_COMMUNITY): Payer: Self-pay

## 2017-12-09 ENCOUNTER — Other Ambulatory Visit (HOSPITAL_COMMUNITY): Payer: Medicare Other

## 2018-01-07 DIAGNOSIS — R7301 Impaired fasting glucose: Secondary | ICD-10-CM | POA: Diagnosis not present

## 2018-01-07 DIAGNOSIS — E785 Hyperlipidemia, unspecified: Secondary | ICD-10-CM | POA: Diagnosis not present

## 2018-01-09 DIAGNOSIS — E782 Mixed hyperlipidemia: Secondary | ICD-10-CM | POA: Diagnosis not present

## 2018-01-09 DIAGNOSIS — Z8669 Personal history of other diseases of the nervous system and sense organs: Secondary | ICD-10-CM | POA: Diagnosis not present

## 2018-01-09 DIAGNOSIS — Z0000 Encounter for general adult medical examination without abnormal findings: Secondary | ICD-10-CM | POA: Diagnosis not present

## 2018-02-05 ENCOUNTER — Ambulatory Visit: Payer: Medicare HMO | Admitting: Diagnostic Neuroimaging

## 2018-02-05 ENCOUNTER — Encounter: Payer: Self-pay | Admitting: Diagnostic Neuroimaging

## 2018-02-05 VITALS — BP 129/68 | HR 60 | Ht 67.0 in | Wt 211.0 lb

## 2018-02-05 DIAGNOSIS — G40209 Localization-related (focal) (partial) symptomatic epilepsy and epileptic syndromes with complex partial seizures, not intractable, without status epilepticus: Secondary | ICD-10-CM | POA: Diagnosis not present

## 2018-02-05 DIAGNOSIS — G40109 Localization-related (focal) (partial) symptomatic epilepsy and epileptic syndromes with simple partial seizures, not intractable, without status epilepticus: Secondary | ICD-10-CM

## 2018-02-05 MED ORDER — LEVETIRACETAM ER 500 MG PO TB24
1000.0000 mg | ORAL_TABLET | Freq: Two times a day (BID) | ORAL | 4 refills | Status: DC
Start: 2018-02-05 — End: 2018-04-04
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
## 2018-02-05 NOTE — Patient Instructions (Signed)
-   continue levetiracetam ER 1000mg  twice a day   - check EEG  - no driving for now

## 2018-03-10 ENCOUNTER — Ambulatory Visit: Payer: Medicare HMO | Admitting: Diagnostic Neuroimaging

## 2018-03-10 DIAGNOSIS — G40109 Localization-related (focal) (partial) symptomatic epilepsy and epileptic syndromes with simple partial seizures, not intractable, without status epilepticus: Secondary | ICD-10-CM

## 2018-03-10 DIAGNOSIS — G40209 Localization-related (focal) (partial) symptomatic epilepsy and epileptic syndromes with complex partial seizures, not intractable, without status epilepticus: Secondary | ICD-10-CM | POA: Diagnosis not present

## 2018-03-17 NOTE — Procedures (Signed)
   [facility]  EEG (ELECTROENCEPHALOGRAM) REPORT   STUDY DATE: 03/10/18 PATIENT NAME: Darlene Moore DOB: [birth year] MRN: [id number]  ORDERING CLINICIAN: Andrey Spearman, MD   TECHNOLOGIST: Oneita Jolly TECHNIQUE: Electroencephalogram was recorded utilizing standard 10-20 system of lead placement and reformatted into average and bipolar montages.  RECORDING TIME: 20 minutes  ACTIVATION: photic stimulation  CLINICAL INFORMATION: 72 year old female with seizures.  FINDINGS: Posterior dominant background rhythms, which attenuate with eye opening, ranging 10-13 hertz and 45-50 microvolts. No focal, lateralizing, epileptiform activity or seizures are seen. Patient recorded in the awake, drowsy and asleep state. EKG channel shows regular rhythm of 50-60 beats per minute.   IMPRESSION:   Normal EEG in the awake and asleep states.    INTERPRETING PHYSICIAN:  Penni Bombard, MD Certified in Neurology, Neurophysiology and Neuroimaging  [facility] [address] [place], [place] [address] [phone]

## 2018-03-18 ENCOUNTER — Telehealth: Payer: Self-pay | Admitting: *Deleted

## 2018-03-18 NOTE — Telephone Encounter (Signed)
Unable to LVM due to full mailbox. If patient calls back phone staff may inform her that her  EEG was normal.

## 2018-03-19 NOTE — Telephone Encounter (Signed)
Attempted to reach patient again with normal EEG results. No answer and VMB full.

## 2018-03-20 ENCOUNTER — Encounter: Payer: Self-pay | Admitting: *Deleted

## 2018-03-20 NOTE — Telephone Encounter (Signed)
Sent result letter to patient today.

## 2018-04-04 ENCOUNTER — Telehealth: Payer: Self-pay | Admitting: Diagnostic Neuroimaging

## 2018-04-04 MED ORDER — LEVETIRACETAM ER 500 MG PO TB24: 1500.0000 mg | ORAL_TABLET | Freq: Two times a day (BID) | ORAL | 4 refills | Status: DC

## 2018-04-04 NOTE — Telephone Encounter (Signed)
Pt has called re: her Focal seizures, pt states she just had one on Monday of this week and would like to know if Dr Leta Baptist wants her to come in before November or if he would just want to adjust her medication.  Pt is asking for a call back

## 2018-04-04 NOTE — Addendum Note (Signed)
Addended by: Andrey Spearman R on: 04/04/2018 12:01 PM   Modules accepted: Orders

## 2018-04-04 NOTE — Telephone Encounter (Signed)
Informed pt to increase the keppra to 1500mg  po BID (take 3-500mg  tabs twice daily).  She verbalized understanding.  New prescription sent to [facility], she is aware.
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
## 2018-04-04 NOTE — Telephone Encounter (Signed)
Increase levetiracetam XR to 1500mg  twice a day.  No driving.  Meds ordered this encounter  Medications  . levETIRAcetam (KEPPRA XR) 500 MG 24 hr tablet    Sig: Take 3 tablets (1,500 mg total) by mouth 2 (two) times daily.    Dispense:  540 tablet    Refill:  [place], MD 03/14/3085, 57:84 PM Certified in Neurology, Neurophysiology and Neuroimaging  [facility] [address], [place], [place] [address] [phone]

## 2018-06-26 ENCOUNTER — Other Ambulatory Visit (HOSPITAL_COMMUNITY): Payer: Self-pay | Admitting: Internal Medicine

## 2018-06-26 DIAGNOSIS — Z1231 Encounter for screening mammogram for malignant neoplasm of breast: Secondary | ICD-10-CM

## 2018-07-17 DIAGNOSIS — E782 Mixed hyperlipidemia: Secondary | ICD-10-CM | POA: Diagnosis not present

## 2018-07-17 DIAGNOSIS — Z8669 Personal history of other diseases of the nervous system and sense organs: Secondary | ICD-10-CM | POA: Diagnosis not present
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
## 2018-07-28 ENCOUNTER — Encounter: Payer: Self-pay | Admitting: Nurse Practitioner

## 2018-07-28 ENCOUNTER — Ambulatory Visit (HOSPITAL_COMMUNITY): Payer: Medicare HMO

## 2018-07-28 ENCOUNTER — Ambulatory Visit: Payer: Medicare HMO | Admitting: Nurse Practitioner

## 2018-07-28 DIAGNOSIS — G40109 Localization-related (focal) (partial) symptomatic epilepsy and epileptic syndromes with simple partial seizures, not intractable, without status epilepticus: Secondary | ICD-10-CM
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
## 2018-08-07 ENCOUNTER — Ambulatory Visit (HOSPITAL_COMMUNITY)
Admission: RE | Admit: 2018-08-07 | Discharge: 2018-08-07 | Disposition: A | Discharge status: Home / Self Care | Payer: Medicare HMO | Source: Ambulatory Visit | Attending: Internal Medicine | Admitting: Internal Medicine

## 2018-08-07 DIAGNOSIS — Z1231 Encounter for screening mammogram for malignant neoplasm of breast: Secondary | ICD-10-CM | POA: Insufficient documentation

## 2018-08-07 DIAGNOSIS — E782 Mixed hyperlipidemia: Secondary | ICD-10-CM | POA: Diagnosis not present

## 2018-08-07 DIAGNOSIS — G4089 Other seizures: Secondary | ICD-10-CM | POA: Diagnosis not present

## 2018-08-11 ENCOUNTER — Other Ambulatory Visit (HOSPITAL_COMMUNITY): Payer: Self-pay | Admitting: Adult Health Nurse Practitioner

## 2018-08-11 DIAGNOSIS — R928 Other abnormal and inconclusive findings on diagnostic imaging of breast: Secondary | ICD-10-CM

## 2018-08-12 NOTE — Progress Notes (Signed)
I reviewed note and agree with plan. No driving.  Penni Bombard, MD  Certified in Neurology, Neurophysiology and Neuroimaging  [facility] [address], [place], [place] [address] [phone]

## 2018-08-26 ENCOUNTER — Ambulatory Visit (HOSPITAL_COMMUNITY)
Admission: RE | Admit: 2018-08-26 | Discharge: 2018-08-26 | Disposition: A | Discharge status: Home / Self Care | Payer: Medicare HMO | Source: Ambulatory Visit | Attending: Adult Health Nurse Practitioner | Admitting: Adult Health Nurse Practitioner

## 2018-08-26 ENCOUNTER — Other Ambulatory Visit (HOSPITAL_COMMUNITY): Payer: Self-pay | Admitting: Adult Health Nurse Practitioner

## 2018-08-26 DIAGNOSIS — R928 Other abnormal and inconclusive findings on diagnostic imaging of breast: Secondary | ICD-10-CM | POA: Diagnosis not present

## 2018-08-26 DIAGNOSIS — N6320 Unspecified lump in the left breast, unspecified quadrant: Secondary | ICD-10-CM | POA: Diagnosis not present

## 2018-09-02 ENCOUNTER — Other Ambulatory Visit (HOSPITAL_COMMUNITY): Payer: Self-pay | Admitting: Internal Medicine

## 2018-09-02 ENCOUNTER — Ambulatory Visit (HOSPITAL_COMMUNITY)
Admission: RE | Admit: 2018-09-02 | Discharge: 2018-09-02 | Disposition: A | Discharge status: Home / Self Care | Payer: Medicare HMO | Source: Ambulatory Visit | Attending: Adult Health Nurse Practitioner | Admitting: Adult Health Nurse Practitioner

## 2018-09-02 ENCOUNTER — Ambulatory Visit (HOSPITAL_COMMUNITY)
Admission: RE | Admit: 2018-09-02 | Discharge: 2018-09-02 | Disposition: A | Discharge status: Home / Self Care | Payer: Medicare HMO | Source: Ambulatory Visit | Attending: Internal Medicine | Admitting: Internal Medicine

## 2018-09-02 ENCOUNTER — Encounter (HOSPITAL_COMMUNITY): Payer: Self-pay

## 2018-09-02 DIAGNOSIS — Z170 Estrogen receptor positive status [ER+]: Secondary | ICD-10-CM | POA: Diagnosis not present

## 2018-09-02 DIAGNOSIS — C50212 Malignant neoplasm of upper-inner quadrant of left female breast: Secondary | ICD-10-CM | POA: Diagnosis not present

## 2018-09-02 DIAGNOSIS — R928 Other abnormal and inconclusive findings on diagnostic imaging of breast: Secondary | ICD-10-CM

## 2018-09-02 DIAGNOSIS — N6322 Unspecified lump in the left breast, upper inner quadrant: Secondary | ICD-10-CM | POA: Diagnosis not present

## 2018-09-02 MED ORDER — LIDOCAINE-EPINEPHRINE (PF) 1 %-1:200000 IJ SOLN
INTRAMUSCULAR | Status: AC
  Filled 2018-09-02: qty 30

## 2018-09-02 MED ORDER — LIDOCAINE HCL (PF) 1 % IJ SOLN
INTRAMUSCULAR | Status: AC
  Filled 2018-09-02: qty 5

## 2018-09-10 ENCOUNTER — Other Ambulatory Visit: Payer: Self-pay

## 2018-09-10 ENCOUNTER — Inpatient Hospital Stay (HOSPITAL_COMMUNITY): Payer: Medicare HMO | Attending: Hematology | Admitting: Hematology

## 2018-09-10 ENCOUNTER — Encounter (HOSPITAL_COMMUNITY): Payer: Self-pay | Admitting: Hematology

## 2018-09-10 DIAGNOSIS — Z79899 Other long term (current) drug therapy: Secondary | ICD-10-CM | POA: Diagnosis not present

## 2018-09-10 DIAGNOSIS — Z170 Estrogen receptor positive status [ER+]: Secondary | ICD-10-CM | POA: Insufficient documentation

## 2018-09-10 DIAGNOSIS — C50212 Malignant neoplasm of upper-inner quadrant of left female breast: Secondary | ICD-10-CM | POA: Diagnosis not present

## 2018-09-10 DIAGNOSIS — G40909 Epilepsy, unspecified, not intractable, without status epilepticus: Secondary | ICD-10-CM | POA: Diagnosis not present

## 2018-09-10 DIAGNOSIS — M8580 Other specified disorders of bone density and structure, unspecified site: Secondary | ICD-10-CM | POA: Insufficient documentation

## 2018-09-10 DIAGNOSIS — Z803 Family history of malignant neoplasm of breast: Secondary | ICD-10-CM

## 2018-09-10 NOTE — Patient Instructions (Signed)
[place] at [facility] Discharge Instructions  Please make sure you have an appointment with [place] 4 weeks after your surgery.   Thank you for choosing [place] at [facility] to provide your oncology and hematology care.  To afford each patient quality time with our provider, please arrive at least 15 minutes before your scheduled appointment time.   If you have a lab appointment with the [facility] please come in thru the  Main Entrance and check in at the main information desk  You need to re-schedule your appointment should you arrive 10 or more minutes late.  We strive to give you quality time with our providers, and arriving late affects you and other patients whose appointments are after yours.  Also, if you no show three or more times for appointments you may be dismissed from the clinic at the providers discretion.     Again, thank you for choosing [facility].  Our hope is that these requests will decrease the amount of time that you wait before being seen by our physicians.       _____________________________________________________________  Should you have questions after your visit to [facility], please contact our office at [phone] between the hours of 8:00 a.m. and 4:30 p.m.  Voicemails left after 4:00 p.m. will not be returned until the following business day.  For prescription refill requests, have your pharmacy contact our office and allow 72 hours.    Cancer Center Support Programs:   > Cancer Support Group  2nd Tuesday of the month 1pm-2pm, Journey Room

## 2018-09-10 NOTE — Assessment & Plan Note (Signed)
1.  Clinical stage I left breast cancer: - She had a screening mammogram on 08/07/2018 which showed BI-RADS Category 0. - On 08/26/2018 she had left breast mammogram additional views, and ultrasound showing 0.9 x 0.9 x 0.7 cm 10:00 mass.  Ultrasound of the left axilla was negative. -He underwent biopsy on 09/02/2018. - We discussed the results of the biopsy which showed invasive ductal carcinoma, grade 1, DCIS intermediate grade, ER 100% positive, PR 30% positive, Ki-67 2%, HER-2 negative. - She is awaiting phone call from radiology regarding referral to surgery.  Patient would like to have her surgery done locally.  She will call us back tomorrow. -I will see her back in 4 weeks after her surgery to discuss further plan. - She had significant family history with 2 sisters diagnosed with metastatic breast cancer.  One sister died at age 40.  The other sister was diagnosed 4 years ago and is still living.  She had at least one maternal aunt and one paternal aunt diagnosed with breast cancer.  Hence we will make a referral to genetic counselor.  2.  Osteopenia: -Last DEXA scan on 07/18/2015 shows T score of -1.7. -We plan to repeat it in the next 1 to 2 months  3.  Seizure disorder: - She will continue Keppra 1500 p.o. twice daily.

## 2018-09-10 NOTE — Progress Notes (Signed)
[facility] CONSULT NOTE  Patient Care Team: Celene Squibb, MD as PCP - General (Internal Medicine)  CHIEF COMPLAINTS/PURPOSE OF CONSULTATION: New diagnosis of left breast cancer.  HISTORY OF PRESENTING ILLNESS:  [facility] 72 y.o. female is here because of a new diagnosis of left breast cancer. She had a normal routine screening mammogram on 11/14 which showed BRIADS of 0. She was sent to have a left diagnostic mammogram on 12/3 which showed a 9 mm mass. Her biopsy was done on 12/10. This was found by routine screening and she did not feel any mass or lump herself. She was referred to a surgeon in  but wishes to be referred to a surgeon in [place] so she can be close to home. She reports having taken birth control pills for 8 years prior to having her son. She had her son at age 81 and that is her only child. She reports menarche at age 14 and menopause at age 51. She denies ever having any miscarriages. Denies hysterectomy. She denies any history of biopsies or cysts in her breast. She has a strong family history of breast cancer in her family and has never seen a Dietitian. She had 2 sisters with breast cancer. One died of metastatic breast cancer at age 67 and the other sister was diagnosed 5 years ago and has metastatic breast cancer as well. Her sister is older than she is.   She lives alone and performs all her own ADLs and activities. She mows her own grass, cooks, cleans. She does not drive anymore due to her seizures. She is able to walk long distances without fatigue and very active for her age.    MEDICAL HISTORY:  Past Medical History:  Diagnosis Date  . High cholesterol   . Medical history non-contributory   . Seizures ([place])     SURGICAL HISTORY: Past Surgical History:  Procedure Laterality Date  . CHOLECYSTECTOMY    . COLONOSCOPY    . COLONOSCOPY N/A 09/29/2015   Procedure: COLONOSCOPY;  Surgeon: Rogene Houston, MD;  Location: [facility];  Service: Endoscopy;  Laterality: N/A;  730 - moved to 9:00 - Ann to notify pt    SOCIAL HISTORY: Social History   Socioeconomic History  . Marital status: Widowed    Spouse name: Not on file  . Number of children: Not on file  . Years of education: Not on file  . Highest education level: Not on file  Occupational History  . Not on file  Social Needs  . Financial resource strain: Not on file  . Food insecurity:    Worry: Not on file    Inability: Not on file  . Transportation needs:    Medical: Not on file    Non-medical: Not on file  Tobacco Use  . Smoking status: Never Smoker  . Smokeless tobacco: Never Used  Substance and Sexual Activity  . Alcohol use: No  . Drug use: No  . Sexual activity: Not on file  Lifestyle  . Physical activity:    Days per week: Not on file    Minutes per session: Not on file  . Stress: Not on file  Relationships  . Social connections:    Talks on phone: Not on file    Gets together: Not on file    Attends religious service: Not on file    Active member of club or organization: Not on file    Attends meetings of  clubs or organizations: Not on file    Relationship status: Not on file  . Intimate partner violence:    Fear of current or ex partner: Not on file    Emotionally abused: Not on file    Physically abused: Not on file    Forced sexual activity: Not on file  Other Topics Concern  . Not on file  Social History Narrative   Lives home alone. Widow.   Retired/ not working.   Education 12th grade.  Children 1.  Drinks decaff coffee, tea.     FAMILY HISTORY: Family History  Problem Relation Age of Onset  . Breast cancer Sister   . Liver cancer Sister   . Diabetes Mother   . Heart disease Father   . Seizures Other        maternal    ALLERGIES:  has No Known Allergies.  MEDICATIONS:  Current Outpatient Medications  Medication Sig Dispense Refill  . CINNAMON PO Take by mouth. 2 tabs daily    . ibuprofen  (ADVIL,MOTRIN) 200 MG tablet Take 400 mg by mouth 3 times/day as needed-between meals & bedtime for mild pain.     Marland Kitchen levETIRAcetam (KEPPRA XR) 500 MG 24 hr tablet Take 3 tablets (1,500 mg total) by mouth 2 (two) times daily. 540 tablet 4  . Multiple Vitamin (MULTIVITAMIN) tablet Take 1 tablet by mouth daily.    . Probiotic Product (PROBIOTIC PO) Take 1 capsule by mouth daily.     No current facility-administered medications for this visit.     REVIEW OF SYSTEMS:   Constitutional: Denies fevers, chills or abnormal night sweats Eyes: Denies blurriness of vision, double vision or watery eyes Ears, nose, mouth, throat, and face: Denies mucositis or sore throat Respiratory: Denies cough, dyspnea or wheezes Cardiovascular: Denies palpitation, chest discomfort or lower extremity swelling Gastrointestinal:  Denies nausea, heartburn or change in bowel habits Skin: Denies abnormal skin rashes Lymphatics: Denies new lymphadenopathy or easy bruising Neurological:Denies numbness, tingling or new weaknesses Behavioral/Psych: Mood is stable, no new changes  All other systems were reviewed with the patient and are negative.  PHYSICAL EXAMINATION: ECOG PERFORMANCE STATUS: 1 - Symptomatic but completely ambulatory  Vitals:   09/10/18 1257  BP: (!) 119/53  Pulse: 75  Resp: 18  Temp: 97.9 F (36.6 C)  SpO2: 100%   Filed Weights   09/10/18 1257  Weight: 207 lb 6.4 oz (94.1 kg)    GENERAL:alert, no distress and comfortable SKIN: skin color, texture, turgor are normal, no rashes or significant lesions EYES: normal, conjunctiva are pink and non-injected, sclera clear OROPHARYNX:no exudate, no erythema and lips, buccal mucosa, and tongue normal  NECK: supple, thyroid normal size, non-tender, without nodularity LYMPH:  no palpable lymphadenopathy in the cervical, axillary or inguinal LUNGS: clear to auscultation and percussion with normal breathing effort HEART: regular rate & rhythm and no  murmurs and no lower extremity edema ABDOMEN:abdomen soft, non-tender and normal bowel sounds Musculoskeletal:no cyanosis of digits and no clubbing  PSYCH: alert & oriented x 3 with fluent speech NEURO: no focal motor/sensory deficits BREAST: RIGHT: No palpable masses, no skin changes or nipple discharge, no adenopathy.                  LEFT: small palpable mass noted upper inner 10 oclock postion, mildly retracted nipple , no adenopathy.  LABORATORY DATA:  I have reviewed the data as listed Lab Results  Component Value Date   WBC 7.8 07/27/2017   HGB 14.1  07/27/2017   HCT 42.1 07/27/2017   MCV 91.7 07/27/2017   PLT 168 07/27/2017     Chemistry      Component Value Date/Time   NA 141 07/27/2017 1151   K 4.1 07/27/2017 1151   CL 104 07/27/2017 1151   CO2 28 07/27/2017 1151   BUN 23 (H) 07/27/2017 1151   CREATININE 0.88 07/27/2017 1151      Component Value Date/Time   CALCIUM 9.6 07/27/2017 1151       RADIOGRAPHIC STUDIES: I have personally reviewed the radiological images as listed and agreed with the findings in the report. US Breast Ltd Uni Left Inc Axilla  Result Date: 08/26/2018 CLINICAL DATA:  72 year old patient recalled from recent screening mammogram for evaluation of a left breast mass. EXAM: DIGITAL DIAGNOSTIC LEFT MAMMOGRAM WITH CAD AND TOMO ULTRASOUND LEFT BREAST COMPARISON:  August 07, 2018 ACR Breast Density Category b: There are scattered areas of fibroglandular density. FINDINGS: Best visualized on the lateral medial view performed today is an approximately 0.9 cm oval mass with spiculated margins in the posterior third of the upper inner left breast. On spot compression views, the mass is pushed posteriorly and not completely visualized. Mammographic images were processed with CAD. On physical exam, there is a subtle smooth focal approximately 1 cm lump in the 10 o'clock position of the left breast 6-7 cm from the nipple. Targeted ultrasound is performed,  showing a hypoechoic oval mass with spiculated and indistinct margins at 10 o'clock position 6 cm from the nipple. There is some vascular flow in the superficial aspect of the mass. The mass measures 0.9 x 0.9 x 0.7 cm. Ultrasound of the left axilla is negative for lymphadenopathy. IMPRESSION: Suspicious 0.9 cm mass in the 10 o'clock position of the left breast. RECOMMENDATION: Ultrasound-guided core needle biopsy of the suspicious mass is recommended and has been scheduled for September 02, 2018. As this mass is difficult to include on mammographic images due to its posterior location, if surgical excision is needed, ultrasound-guided localization should be considered. I have discussed the findings and recommendations with the patient. Results were also provided in writing at the conclusion of the visit. If applicable, a reminder letter will be sent to the patient regarding the next appointment. BI-RADS CATEGORY  5: Highly suggestive of malignancy. Electronically Signed   By: Curlene Dolphin M.D.   On: 08/26/2018 10:55   Mm Diag Breast Tomo Uni Left  Result Date: 08/26/2018 CLINICAL DATA:  72 year old patient recalled from recent screening mammogram for evaluation of a left breast mass. EXAM: DIGITAL DIAGNOSTIC LEFT MAMMOGRAM WITH CAD AND TOMO ULTRASOUND LEFT BREAST COMPARISON:  August 07, 2018 ACR Breast Density Category b: There are scattered areas of fibroglandular density. FINDINGS: Best visualized on the lateral medial view performed today is an approximately 0.9 cm oval mass with spiculated margins in the posterior third of the upper inner left breast. On spot compression views, the mass is pushed posteriorly and not completely visualized. Mammographic images were processed with CAD. On physical exam, there is a subtle smooth focal approximately 1 cm lump in the 10 o'clock position of the left breast 6-7 cm from the nipple. Targeted ultrasound is performed, showing a hypoechoic oval mass with spiculated  and indistinct margins at 10 o'clock position 6 cm from the nipple. There is some vascular flow in the superficial aspect of the mass. The mass measures 0.9 x 0.9 x 0.7 cm. Ultrasound of the left axilla is negative for lymphadenopathy. IMPRESSION: Suspicious 0.9  cm mass in the 10 o'clock position of the left breast. RECOMMENDATION: Ultrasound-guided core needle biopsy of the suspicious mass is recommended and has been scheduled for September 02, 2018. As this mass is difficult to include on mammographic images due to its posterior location, if surgical excision is needed, ultrasound-guided localization should be considered. I have discussed the findings and recommendations with the patient. Results were also provided in writing at the conclusion of the visit. If applicable, a reminder letter will be sent to the patient regarding the next appointment. BI-RADS CATEGORY  5: Highly suggestive of malignancy. Electronically Signed   By: Curlene Dolphin M.D.   On: 08/26/2018 10:55   Mm Clip Placement Left  Result Date: 09/02/2018 CLINICAL DATA:  Post ultrasound-guided biopsy of the mass in the upper far inner in far posterior left breast at the 10 o'clock position. EXAM: DIAGNOSTIC LEFT MAMMOGRAM POST ULTRASOUND BIOPSY COMPARISON:  Previous exam(s). FINDINGS: Mammographic images were obtained following ultrasound guided biopsy of a suspicious mass in the upper inner far posterior left breast. A spiral shaped HydroMARK clip is identified at the site of the biopsied mass in the left breast on the MLO view, however could not be included in the field of view on the CC images due to the far posterior location. IMPRESSION: 1. Spiral shaped HydroMARK clip at site of biopsied mass in the left breast on the MLO view, however could not be included in the field of view on the CC images due to the far posteromedial location of the mass. 2. If this mass needs to be localized for surgery, then would recommend ultrasound-guided  localization due to the far posteromedial location of the mass with non visualization of the biopsy marking clip on the CC images. Final Assessment: Post Procedure Mammograms for Marker Placement Electronically Signed   By: Everlean Alstrom M.D.   On: 09/02/2018 13:52   [place] Lt Breast Bx W Loc Dev 1st Lesion Img Bx Spec US Guide  Result Date: 09/02/2018 CLINICAL DATA:  72 year old female with a suspicious mass in the far upper inner left breast at the 10 o'clock position. EXAM: ULTRASOUND GUIDED LEFT BREAST CORE NEEDLE BIOPSY COMPARISON:  Previous exam(s). FINDINGS: I met with the patient and we discussed the procedure of ultrasound-guided biopsy, including benefits and alternatives. We discussed the high likelihood of a successful procedure. We discussed the risks of the procedure, including infection, bleeding, tissue injury, clip migration, and inadequate sampling. Informed written consent was given. The usual time-out protocol was performed immediately prior to the procedure. Lesion quadrant: Upper inner Using sterile technique and 1% Lidocaine as local anesthetic, under direct ultrasound visualization, a 14 gauge spring-loaded device was used to perform biopsy of the mass in the upper inner left breast at the 10 o'clock position using a medial to lateral approach. At the conclusion of the procedure a spiral shaped HydroMARK tissue marker clip was deployed into the biopsy cavity. Follow up 2 view mammogram was performed and dictated separately. IMPRESSION: Ultrasound guided biopsy of the mass in the left breast at the 10 o'clock position. No apparent complications. Electronically Signed   By: Everlean Alstrom M.D.   On: 09/02/2018 13:26    ASSESSMENT & PLAN:  Breast cancer of upper-inner quadrant of left female breast ([place]) 1.  Clinical stage I left breast cancer: - She had a screening mammogram on 08/07/2018 which showed BI-RADS Category 0. - On 08/26/2018 she had left breast mammogram additional  views, and ultrasound showing 0.9 x 0.9  x 0.7 cm 10:00 mass.  Ultrasound of the left axilla was negative. -He underwent biopsy on 09/02/2018. - We discussed the results of the biopsy which showed invasive ductal carcinoma, grade 1, DCIS intermediate grade, ER 100% positive, PR 30% positive, Ki-67 2%, HER-2 negative. - She is awaiting phone call from radiology regarding referral to surgery.  Patient would like to have her surgery done locally.  She will call us back tomorrow. -I will see her back in 4 weeks after her surgery to discuss further plan. - She had significant family history with 2 sisters diagnosed with metastatic breast cancer.  One sister died at age 107.  The other sister was diagnosed 4 years ago and is still living.  She had at least one maternal aunt and one paternal aunt diagnosed with breast cancer.  Hence we will make a referral to genetic counselor.  2.  Osteopenia: -Last DEXA scan on 07/18/2015 shows T score of -1.7. -We plan to repeat it in the next 1 to 2 months  3.  Seizure disorder: - She will continue Keppra 1500 p.o. twice daily.  No orders of the defined types were placed in this encounter.  Total time spent was 60 minutes with more than 50% of the time spent face-to-face discussing diagnosis, prognosis, management options and coordination of care.  All questions were answered. The patient knows to call the clinic with any problems, questions or concerns.  Derek Jack, MD

## 2018-09-13 ENCOUNTER — Emergency Department (HOSPITAL_COMMUNITY): Payer: Medicare HMO

## 2018-09-13 ENCOUNTER — Encounter (HOSPITAL_COMMUNITY): Payer: Self-pay | Admitting: Emergency Medicine

## 2018-09-13 ENCOUNTER — Emergency Department (HOSPITAL_COMMUNITY)
Admission: EM | Admit: 2018-09-13 | Discharge: 2018-09-14 | Disposition: A | Discharge status: Home / Self Care | Payer: Medicare HMO | Attending: Emergency Medicine | Admitting: Emergency Medicine

## 2018-09-13 ENCOUNTER — Other Ambulatory Visit: Payer: Self-pay

## 2018-09-13 DIAGNOSIS — Y999 Unspecified external cause status: Secondary | ICD-10-CM | POA: Diagnosis not present

## 2018-09-13 DIAGNOSIS — Y939 Activity, unspecified: Secondary | ICD-10-CM | POA: Insufficient documentation

## 2018-09-13 DIAGNOSIS — S065X0A Traumatic subdural hemorrhage without loss of consciousness, initial encounter: Secondary | ICD-10-CM | POA: Diagnosis not present

## 2018-09-13 DIAGNOSIS — W0110XA Fall on same level from slipping, tripping and stumbling with subsequent striking against unspecified object, initial encounter: Secondary | ICD-10-CM | POA: Diagnosis not present

## 2018-09-13 DIAGNOSIS — R51 Headache: Secondary | ICD-10-CM | POA: Diagnosis not present

## 2018-09-13 DIAGNOSIS — Z79899 Other long term (current) drug therapy: Secondary | ICD-10-CM | POA: Diagnosis not present

## 2018-09-13 DIAGNOSIS — S0003XA Contusion of scalp, initial encounter: Secondary | ICD-10-CM | POA: Diagnosis not present

## 2018-09-13 DIAGNOSIS — S0990XA Unspecified injury of head, initial encounter: Secondary | ICD-10-CM | POA: Diagnosis not present

## 2018-09-13 DIAGNOSIS — Y929 Unspecified place or not applicable: Secondary | ICD-10-CM | POA: Diagnosis not present

## 2018-09-13 LAB — COMPREHENSIVE METABOLIC PANEL
ALK PHOS: 88 U/L (ref 38–126)
ALT: 19 U/L (ref 0–44)
AST: 21 U/L (ref 15–41)
Albumin: 3.8 g/dL (ref 3.5–5.0)
Anion gap: 5 (ref 5–15)
BUN: 26 mg/dL — ABNORMAL HIGH (ref 8–23)
CALCIUM: 9.3 mg/dL (ref 8.9–10.3)
CHLORIDE: 108 mmol/L (ref 98–111)
CO2: 28 mmol/L (ref 22–32)
CREATININE: 0.73 mg/dL (ref 0.44–1.00)
GFR calc non Af Amer: >60 mL/min (ref >60)
GLUCOSE: 114 mg/dL — AB (ref 70–99)
Potassium: 4.2 mmol/L (ref 3.5–5.1)
SODIUM: 141 mmol/L (ref 135–145)
Total Bilirubin: 0.4 mg/dL (ref 0.3–1.2)
Total Protein: 7.1 g/dL (ref 6.5–8.1)

## 2018-09-13 LAB — CBC WITH DIFFERENTIAL/PLATELET
ABS IMMATURE GRANULOCYTES: 0.01 10*3/uL (ref 0.00–0.07)
BASOS PCT: 1 %
Basophils Absolute: 0.1 10*3/uL (ref 0.0–0.1)
Eosinophils Absolute: 0.2 10*3/uL (ref 0.0–0.5)
Eosinophils Relative: 2 %
HCT: 41.1 % (ref 36.0–46.0)
HEMOGLOBIN: 13.3 g/dL (ref 12.0–15.0)
IMMATURE GRANULOCYTES: 0 %
LYMPHS PCT: 24 %
Lymphs Abs: 1.5 10*3/uL (ref 0.7–4.0)
MCH: 29.8 pg (ref 26.0–34.0)
MCHC: 32.4 g/dL (ref 30.0–36.0)
MCV: 92.2 fL (ref 80.0–100.0)
Monocytes Absolute: 0.7 10*3/uL (ref 0.1–1.0)
Monocytes Relative: 10 %
NEUTROS ABS: 4 10*3/uL (ref 1.7–7.7)
NEUTROS PCT: 63 %
NRBC: 0 % (ref 0.0–0.2)
PLATELETS: 149 10*3/uL — AB (ref 150–400)
RBC: 4.46 MIL/uL (ref 3.87–5.11)
RDW: 12 % (ref 11.5–15.5)
WBC: 6.3 10*3/uL (ref 4.0–10.5)

## 2018-09-13 MED ORDER — LEVETIRACETAM ER 500 MG PO TB24
1500.0000 mg | ORAL_TABLET | Freq: Once | ORAL | Status: AC
  Administered 2018-09-13: 1500 mg via ORAL
  Filled 2018-09-13: qty 3

## 2018-09-13 NOTE — ED Provider Notes (Signed)
[facility] Provider Note   CSN: [id number] Arrival date & time: 09/13/18  2059     History   Chief Complaint Chief Complaint  Patient presents with  . Seizures    HPI Darlene Moore is a 72 y.o. female.  Patient had a syncopal episode and hit her head.  Patient has a history of seizures that she has syncopal episodes with.  Patient complains of mild headache  The history is provided by the patient. No language interpreter was used.  Fall  This is a new problem. The current episode started 6 to 12 hours ago. The problem occurs rarely. The problem has been resolved. Pertinent negatives include no chest pain, no abdominal pain and no headaches. Nothing aggravates the symptoms. Nothing relieves the symptoms. She has tried nothing for the symptoms.    Past Medical History:  Diagnosis Date  . High cholesterol   . Medical history non-contributory   . Seizures [facility])     Patient Active Problem List   Diagnosis Date Noted  . Breast cancer of upper-inner quadrant of left female breast ([place]) 09/10/2018  . Temporal lobe epilepsy ([place]) 07/28/2018  . Closed nondisplaced fracture of sixth cervical vertebra with routine healing 09/18/2017    Past Surgical History:  Procedure Laterality Date  . CHOLECYSTECTOMY    . COLONOSCOPY    . COLONOSCOPY N/A 09/29/2015   Procedure: COLONOSCOPY;  Surgeon: Rogene Houston, MD;  [facility];  Service: Endoscopy;  Laterality: N/A;  730 - moved to 9:00 - Ann to notify pt     OB History   No obstetric history on file.      Home Medications    Prior to Admission medications   Medication Sig Start Date End Date Taking? Authorizing Provider  CINNAMON PO Take by mouth. 2 tabs daily    [provider]  ibuprofen (ADVIL,MOTRIN) 200 MG tablet Take 400 mg by mouth 3 times/day as needed-between meals & bedtime for mild pain.     [provider]  levETIRAcetam (KEPPRA XR) 500 MG 24 hr tablet Take 3  tablets (1,500 mg total) by mouth 2 (two) times daily. 04/04/18   Penumalli, Earlean Polka, MD  Multiple Vitamin (MULTIVITAMIN) tablet Take 1 tablet by mouth daily.    [provider]  Probiotic Product (PROBIOTIC PO) Take 1 capsule by mouth daily.    [provider]    Family History Family History  Problem Relation Age of Onset  . Breast cancer Sister   . Liver cancer Sister   . Diabetes Mother   . Heart disease Father   . Seizures Other        maternal    Social History Social History   Tobacco Use  . Smoking status: Never Smoker  . Smokeless tobacco: Never Used  Substance Use Topics  . Alcohol use: No  . Drug use: No     Allergies   Patient has no known allergies.   Review of Systems Review of Systems  Constitutional: Negative for appetite change and fatigue.  HENT: Negative for congestion, ear discharge and sinus pressure.        Headache  Eyes: Negative for discharge.  Respiratory: Negative for cough.   Cardiovascular: Negative for chest pain.  Gastrointestinal: Negative for abdominal pain and diarrhea.  Genitourinary: Negative for frequency and hematuria.  Musculoskeletal: Negative for back pain.  Skin: Negative for rash.  Neurological: Negative for seizures and headaches.  Psychiatric/Behavioral: Negative for hallucinations.  Physical Exam Updated Vital Signs BP 117/70 (BP Location: Right Arm)   Pulse 80   Temp 98.3 F (36.8 C) (Oral)   Resp 18   Ht 5\' 5"  (1.651 m)   Wt 94.1 kg   SpO2 100%   BMI 34.51 kg/m   Physical Exam Constitutional:      Appearance: She is well-developed.  HENT:     Head: Normocephalic.     Comments: In her posterior head    Nose: Nose normal.  Eyes:     General: No scleral icterus.    Conjunctiva/sclera: Conjunctivae normal.  Neck:     Musculoskeletal: Neck supple.     Thyroid: No thyromegaly.  Cardiovascular:     Rate and Rhythm: Normal rate and regular rhythm.     Heart sounds: No murmur. No  friction rub. No gallop.   Pulmonary:     Breath sounds: No stridor. No wheezing or rales.  Chest:     Chest wall: No tenderness.  Abdominal:     General: There is no distension.     Tenderness: There is no abdominal tenderness. There is no rebound.  Musculoskeletal: Normal range of motion.  Lymphadenopathy:     Cervical: No cervical adenopathy.  Skin:    Findings: No erythema or rash.  Neurological:     Mental Status: She is oriented to person, place, and time.     Motor: No abnormal muscle tone.     Coordination: Coordination normal.  Psychiatric:        Behavior: Behavior normal.      ED Treatments / Results  Labs (all labs ordered are listed, but only abnormal results are displayed) Labs Reviewed  CBC WITH DIFFERENTIAL/PLATELET - Abnormal; Notable for the following components:      Result Value   Platelets 149 (*)    All other components within normal limits  COMPREHENSIVE METABOLIC PANEL    EKG None  Radiology No results found.  Procedures Procedures (including critical care time)  Medications Ordered in ED Medications - No data to display   Initial Impression / Assessment and Plan / ED Course  I have reviewed the triage vital signs and the nursing notes.  Pertinent labs & imaging results that were available during my care of the patient were reviewed by me and considered in my medical decision making (see chart for details). Patient has a very small subdural hematoma.  I spoke with neurosurgery who stated that the patient can just follow-up as needed.  She will be discharged home and a family member will stay with her today.  She will follow-up with her PCP      Final Clinical Impressions(s) / ED Diagnoses   Final diagnoses:  None    ED Discharge Orders    None       Milton Ferguson, MD 09/13/18 2343

## 2018-09-13 NOTE — Discharge Instructions (Addendum)
Rest at home for the next 24 to 48 hours.  Take Tylenol for pain and follow-up with your family doctor this week for recheck

## 2018-09-13 NOTE — ED Triage Notes (Addendum)
Pt states she had a seizure this morning. Pt states that she "feel out in the kitchen and woke up on the cough in her living room." Pt states she has felt lightheaded all day. Pt also C/O pain in the back of her head.

## 2018-09-16 ENCOUNTER — Ambulatory Visit: Payer: Medicare HMO | Admitting: General Surgery

## 2018-09-16 ENCOUNTER — Other Ambulatory Visit (HOSPITAL_COMMUNITY): Payer: Self-pay | Admitting: General Surgery

## 2018-09-16 ENCOUNTER — Encounter (INDEPENDENT_AMBULATORY_CARE_PROVIDER_SITE_OTHER): Payer: Self-pay

## 2018-09-16 ENCOUNTER — Encounter: Payer: Self-pay | Admitting: General Surgery

## 2018-09-16 VITALS — BP 128/78 | HR 66 | Temp 97.1°F | Resp 20 | Wt 210.4 lb

## 2018-09-16 DIAGNOSIS — C50212 Malignant neoplasm of upper-inner quadrant of left female breast: Secondary | ICD-10-CM | POA: Diagnosis not present

## 2018-09-16 DIAGNOSIS — Z170 Estrogen receptor positive status [ER+]: Secondary | ICD-10-CM | POA: Diagnosis not present

## 2018-09-16 DIAGNOSIS — Z853 Personal history of malignant neoplasm of breast: Secondary | ICD-10-CM

## 2018-09-16 NOTE — Patient Instructions (Signed)
Lumpectomy  A lumpectomy, sometimes called a partial mastectomy, is surgery to remove a cancerous tumor or mass (the lump) from a breast. It is a form of "breast conserving" or "breast preservation" surgery. This means that the cancerous tissue is removed but the breast remains intact. During a lumpectomy, the portion of the breast that contains the tumor is removed. Some normal tissue around the lump may be taken out to make sure that all of the tumor has been removed. Lymph nodes under your arm may also be removed and tested to find out if the cancer has spread. Lymph nodes are part of the body's disease-fighting system (immune system) and are usually the first place where breast cancer spreads. Tell a health care provider about:  Any allergies you have.  All medicines you are taking, including vitamins, herbs, eye drops, creams, and over-the-counter medicines.  Any problems you or family members have had with anesthetic medicines.  Any blood disorders you have.  Any surgeries you have had.  Any medical conditions you have.  Whether you are pregnant or may be pregnant. What are the risks? Generally, this is a safe procedure. However, problems may occur, including:  Bleeding.  Infection.  Allergic reaction to medicines.  Pain, swelling, weakness, or numbness in the arm on the side of your surgery.  Temporary swelling.  Change in the shape of the breast, particularly if a large portion is removed.  Scar tissue that forms at the surgical site and feels hard to the touch. Medicines  Ask your health care provider about: ? Changing or stopping your regular medicines. This is especially important if you are taking diabetes medicines or blood thinners. ? Taking medicines such as aspirin and ibuprofen. These medicines can thin your blood. Do not take these medicines before your procedure if your health care provider instructs you not to.  You may be given antibiotic medicine to help  prevent infection. General instructions  Prior to surgery, your health care provider may do a procedure to locate and mark the tumor area in your breast (localization). This procedure will help guide your surgeon to where the incision will be made. This may be done with: ? Imaging. This may include a mammogram, ultrasound, or MRI. ? Insertion of a small wire, clip, seed, or radar reflector implant.  You may be screened for extra fluid around the lymph nodes (lymphedema).  Ask your health care provider how your surgical site will be marked or identified.  Plan to have someone take you home from the hospital or clinic. What happens during the procedure?   To lower your risk of infection: ? Your health care team will wash or sanitize their hands. ? Your skin will be washed with soap.  An IV will be inserted into one of your veins.  You will be given one or more of the following: ? A medicine to help you relax (sedative). ? A medicine to numb the area (local anesthetic). ? A medicine to make you fall asleep (general anesthetic).  Your health care provider will use a kind of electric scalpel that uses heat to minimize bleeding (electrocautery knife). A curved incision that follows the natural curve of your breast will be made. This type of incision will allow for minimal scarring and better healing.  The tumor will be removed along with some of the surrounding tissue. This will be sent to the lab for testing. Your health care provider may also remove lymph nodes at this time if needed.  If the tumor is close to the muscles over your chest, some muscle tissue may also be removed.  A small drain tube may be inserted into your breast area or armpit to collect fluid that may build up after surgery. This tube will be connected to a suction bulb on the outside of your body to remove the fluid.  The incision will be closed with stitches (sutures).  A bandage (dressing) may be placed over the  incision. The procedure may vary among health care providers and hospitals. What happens after the procedure?  Your blood pressure, heart rate, breathing rate, and blood oxygen level will be monitored until you leave the hospital or clinic.  You will be given medicine for pain as needed.  Your IV will be removed when you are able to eat and drink by mouth.  You will be encouraged to get up and walk as soon as you can. This is important to improve blood flow and breathing. Ask for help if you feel weak or unsteady.  You may have a drain tube in place for 2-3 days to prevent a collection of blood (hematoma) from developing in the breast. You will be given instructions about caring for the drain before you go home.  A pressure bandage may be applied for 1-2 days to prevent bleeding or swelling. Your pressure bandage may look like a thick piece of fabric or an elastic wrap. Ask your health care provider how to care for your bandage at home.  You may be given a tight sleeve to wear over your arm on the side of your surgery. You should wear this sleeve as told by your health care provider.  Do not drive for 24 hours if you were given a sedative during your procedure. Summary  A lumpectomy, sometimes called a partial mastectomy, is surgery to remove a cancerous tumor or mass (the lump) from a breast.  During a lumpectomy, the portion of the breast that contains the tumor is removed. Some normal tissue around the lump may be taken out to make sure that all of the tumor has been removed. Lymph nodes under your arm may also be removed and tested to find out if the cancer has spread.  You may have a drain tube in place for 2-3 days to prevent a collection of blood (hematoma) from developing in the breast. You will be given instructions about caring for the drain before you go home.  Plan to have someone take you home from the hospital or clinic. This information is not intended to replace advice  given to you by your health care provider. Make sure you discuss any questions you have with your health care provider. Document Released: 10/22/2006 Document Revised: 03/11/2018 Document Reviewed: 05/23/2016 Elsevier Interactive Patient Education  2019 [employer].    Sentinel Lymph Node Biopsy in Breast Cancer Treatment Sentinel lymph node biopsy is a procedure to identify, remove, and examine one or more lymph nodes for cancer. Lymph nodes are collections of tissue that filter infections, cancer cells, and other waste substances from the bloodstream. Cancer can spread to nearby lymph nodes. It usually spreads to one lymph node first, and then it spreads to others. The first lymph node that the cancer could spread to is called the sentinel lymph node. In some cases, there may be more than one sentinel lymph node. If you have breast cancer, you may have this procedure to determine whether your cancer has spread. For breast cancer, a sentinel lymph  node is usually in your armpit because that is where breast cancer tends to spread first. If no cancer is found in a sentinel lymph node, it is very unlikely that the cancer has spread to any of the other lymph nodes. If cancer is found in a sentinel lymph node, your surgeon may remove additional lymph nodes for examination. Tell a health care provider about:  Any allergies you have.  All medicines you are taking, including vitamins, herbs, eye drops, creams, and over-the-counter medicines.  Any problems you or family members have had with anesthetic medicines.  Any blood disorders you have.  Any surgeries you have had.  Any medical conditions you have.  Whether you are pregnant or may be pregnant. What are the risks? Generally, this is a safe procedure. However, problems may occur, including:  Infection.  Allergic reactions to medicines or dyes.  Staining of the skin where the dye is injected.  Damaged lymph vessels, causing a buildup  of fluid (lymphedema).  Damage to other structures or organs.  Pain, bleeding, or bruising where a lymph node was removed (biopsy site).  A false-negative biopsy. This is when cancer cells are not found in the sentinel node, but the cancer has spread to other nodes in the area. Medicines  Ask your health care provider about: ? Changing or stopping your regular medicines. This is especially important if you are taking diabetes medicines or blood thinners. ? Taking medicines such as aspirin and ibuprofen. These medicines can thin your blood. Do not take these medicines before your procedure if your health care provider instructs you not to.  You may be given antibiotic medicine to help prevent infection. General instructions  If you smoke, stop smoking at least 2 weeks before the procedure. This will improve your health after the procedure and reduce your risk of getting a wound infection.  You may have blood tests to make sure your blood clots normally.  You may be screened for extra fluid around the lymph nodes (lymphedema).  Plan to have someone take you home from the hospital or clinic.  Ask your health care provider how your surgical site will be marked or identified. What happens during the procedure?   To reduce your risk of infection: ? Your health care team will wash or sanitize their hands. ? Your skin will be washed with soap.  You will be given one of the following: ? A medicine to numb the area (local anesthetic). ? A medicine to make you fall asleep (general anesthetic).  Blue dye or a radioactive substance or both will be injected around the tumor in your breast. ? The blue dye will reach your lymph node quickly. It may be given just before surgery. ? The radioactive substance will take longer to reach your lymph nodes, so it may be given before you go into the operating area. ? Both the dye and the radioactive substance will follow the same path that a spreading  cancer would be likely to follow.  If a radioactive substance was injected,a scanner will show where the substance has spread to help identify the sentinel lymph node.  The surgeon will make a small incision. If blue dye was injected, your surgeon will look for any lymph nodes that have picked up the dye.  Sentinel lymph nodes will be removed and sent to a lab for examination. ? If no cancer is found, no other lymph nodes will be removed. This means it is unlikely that the cancer has  spread to other lymph nodes. ? If cancer is found, the surgeon will remove other lymph nodes in the armpit for examination. This may happen during the same procedure or at a later time.  The incision will be closed with stitches (sutures) or metal clips.  Small adhesive bandages may be used to keep the skin edges close together.  A small dressing may be taped over the incision area. The procedure may vary among health care providers and hospitals. What happens after the procedure?  Your blood pressure, heart rate, breathing rate, and blood oxygen level will be monitored until the medicines you were given have worn off.  Your urine may be blue for the next 24 hours. This is normal. It is caused by the dye that is used during the procedure.  Your skin at the injection site may be blue for up to 8 weeks.  You may feel numbness, tingling, or pain near your incision.  You may have swelling or bruising near your incision. Summary  Sentinel lymph node biopsy is a procedure to identify, remove, and examine one or more lymph nodes for cancer.  If you have breast cancer, you may have this procedure to determine whether your cancer has spread.  If cancer is found in a sentinel lymph node, your surgeon may remove additional lymph nodes for examination. This information is not intended to replace advice given to you by your health care provider. Make sure you discuss any questions you have with your health care  provider. Document Released: 09/10/2005 Document Revised: 03/29/2017 Document Reviewed: 05/30/2016 Elsevier Interactive Patient Education  2019 [place], Female  Breast cancer is a malignant growth of tissue (tumor) in the breast. Unlike noncancerous (benign) tumors, malignant tumors are cancerous and can spread to other parts of the body. The two most common types of breast cancer start in the milk ducts (ductal carcinoma) or in the lobules where milk is made in the breast (lobular carcinoma). Breast cancer is one of the most common types of cancer in women. What are the causes? The exact cause of female breast cancer is unknown. What increases the risk? The following factors may make you more likely to develop this condition:  Being older than 70 years of age.  Race and ethnicity. Caucasian women generally have an increased risk, but African-American women are more likely to develop the disease before age 17.  Having a family history of breast cancer.  Having had breast cancer in the past.  Having certain noncancerous conditions of the breast, such as dense breast tissue.  Having the BRCA1 and BRCA2 genes.  Having a history of radiation exposure.  Obesity.  Starting menopause after age 48.  Starting your menstrual periods before age 23.  Having never been pregnant or having your first child after age 35.  Having never breastfed.  Using hormone therapy after menopause.  Using birth control pills.  Drinking more than one alcoholic drink a day.  Exposure to the drug DES, which was given to pregnant women from the 1940s to the 1970s. What are the signs or symptoms? Symptoms of this condition include:  A painless lump or thickening in your breast.  Changes in the size or shape of your breast.  Breast skin changes, such as puckering or dimpling.  Nipple abnormalities, such as scaling, crustiness, redness, or pulling in (retraction).  Nipple  discharge that is bloody or clear. How is this diagnosed? This condition may be diagnosed by:  Taking your  medical history and doing a physical exam. During the exam, your health care provider will feel the tissue around your breast and under your arms.  Taking a sample of nipple discharge. The sample will be examined under a microscope.  Performing imaging tests, such as breast X-rays (mammogram), breast ultrasound exams, or an MRI.  Taking a tissue sample (biopsy) from the breast. The sample will be examined under a microscope to look for cancer cells.  Taking a sample from the lymph nodes near the affected breast (sentinel node biopsy). Your cancer will be staged to determine its severity and extent. Staging is a careful attempt to find out the size of the tumor, whether the cancer has spread, and if so, to what parts of the body. Staging also includes testing your tumor for certain receptors, such as estrogen, progesterone, and human epidermal growth factor receptor 2 (HER2). This will help your cancer care team decide on a treatment that will work best for you. You may need to have more tests to determine the stage of your cancer. Stages include the following:  Stage 0-The tumor has not spread to other breast tissue.  Stage I-The cancer is only found in the breast or may be in the lymph nodes. The tumor may be up to  in (2 cm) wide.  Stage II-The cancer has spread to nearby lymph nodes. The tumor may be up to 2 in (5 cm) wide.  Stage III-The cancer has spread to more distant lymph nodes. The tumor may be larger than 2 in (5 cm) wide.  Stage IV-The cancer has spread to other parts of the body, such as the bones, brain, liver, or lungs. How is this treated? Treatment for this condition depends on the type and stage of the breast cancer. It may be treated with:  Surgery. This may involve breast-conserving surgery (lumpectomy or partial mastectomy) in which only the part of the breast  containing the cancer is removed. Some normal tissue surrounding this area may also be removed. In some cases, surgery may be done to remove the entire breast (mastectomy) and nipple. Lymph nodes may also be removed.  Radiation therapy, which uses high-energy rays to kill cancer cells.  Chemotherapy, which is the use of drugs to kill cancer cells.  Hormone therapy, which involves taking medicine to adjust the hormone levels in your body. You may take medicine to decrease your estrogen levels. This can help stop cancer cells from growing.  Targeted therapy, in which drugs are used to block the growth and spread of cancer cells. These drugs target a specific part of the cancer cell and usually cause fewer side effects than chemotherapy. Targeted therapy may be used alone or in combination with chemotherapy.  A combination of surgery, radiation, chemotherapy, or hormone therapy may be needed to treat breast cancer. Follow these instructions at home:  Take over-the-counter and prescription medicines only as told by your health care provider.  Eat a healthy diet. A healthy diet includes lots of fruits and vegetables, low-fat dairy products, lean meats, and fiber. ? Make sure half your plate is filled with fruits or vegetables. ? Choose high-fiber foods such as whole-grain breads and cereals.  Consider joining a support group. This may help you learn to cope with the stress of having breast cancer.  Talk to your health care team about exercise and physical activity. The right exercise program can: ? Help prevent or reduce symptoms such as fatigue or depression. ? Improve overall health and  survival rates.  Keep all follow-up visits as told by your health care provider. This is important. Where to find more information  American Cancer Society: www.cancer.[place]: www.cancer.gov Contact a health care provider if:  You have a sudden increase in pain.  You have any  symptoms or changes that concern you.  You lose weight without trying.  You notice a new lump in either breast or under your arm.  You develop swelling in either arm or hand.  You have a fever.  You notice new fatigue or weakness. Get help right away if:  You have chest pain or trouble breathing.  You faint. Summary  Breast cancer is a malignant growth of tissue (tumor) in the breast.  Your cancer will be staged to determine its severity and extent.  Treatment for this condition depends on the type and stage of the breast cancer. This information is not intended to replace advice given to you by your health care provider. Make sure you discuss any questions you have with your health care provider. Document Released: 12/19/2005 Document Revised: 05/06/2017 Document Reviewed: 05/06/2017 Elsevier Interactive Patient Education  2019 [place].   Surgical Options for Early-Stage Breast Cancer  Surgery is usually the first treatment for early-stage breast cancer. Most women have two surgery options. One is called partial mastectomy, or breast-sparing or breast-conserving surgery, and the other is called mastectomy. Both surgeries have good survival rates. Breast cancer is different for everyone, even in its early stage. The best treatment for one person might not be the best treatment for another. Learn as much as you can about your cancer and work closely with your health care providers to make the choice that produces the best results for you. What is partial mastectomy? (Lumpectomy) Partial mastectomy, also called breast-sparing surgery or breast-conserving surgery, is surgery to remove the cancer along with some normal breast tissue that surrounds it. Lymph nodes from under the arm may also be removed and tested to find out if the cancer has spread. If cancer is located near the chest wall, part of the chest wall lining may also be removed. What is a mastectomy? A mastectomy is  surgery to remove the cancer along with the entire breast tissue. There are several types of mastectomy:  Simple or total mastectomy. In this surgery the entire breast is removed, including breast tissue, nipple, areola and skin around the breast. Some lymph nodes may also be removed from under the arm. If cancer is located near the chest wall, part of the chest wall lining may also be removed.  Skin-sparing mastectomy. In this surgery the breast tissue, nipple, and areola are removed and most of the skin over the breast is left in place. This surgery results in less scar tissue than other mastectomy surgeries, which allows for a more natural breast reconstruction.  Nipple-sparing mastectomy. In this surgery, breast tissue is removed but the skin and nipple is left in place. The tissue under the nipple and areola may be removed if cancer is found in the area. This may be an option for women who choose to have breast reconstruction after mastectomy.  Modified radical mastectomy. This surgery is the same as a simple mastectomy but also includes removing lymph nodes from under the arm (axillary lymph node dissection).  Radical mastectomy. In this surgery the entire breast, the lymph nodes under the arm, and the chest wall muscles under the breast are removed. This surgery is rarely done now. A modified  radical mastectomy is preferred because it is just as effective, but with the added advantage of fewer side effects. What are some advantages and disadvantages of these surgeries? Partial mastectomy Advantages of partial mastectomy include:  Keeping most of your breast tissue intact, allowing for a more natural look to the breast.  Easier recovery when compared to a mastectomy.  Ability to go home on the day of the procedure. Disadvantages of partial mastectomy include:  Slightly higher risk that your cancer will come back.  Needing more surgery at a later time.  Requiring radiation therapy  after surgery, which has side effects and possible complications. This is done to reduce the chances of breast cancer returning. Mastectomy Advantages of a mastectomy include:  Not needing to have radiation therapy or other treatments after surgery.  Lower chances of your cancer coming back. Disadvantages of a mastectomy include:  Longer recovery time compared to partial mastectomy.  Possibility of more complications.  Requiring additional surgeries to reconstruct your breast. Where to find more information  [place]: https://www.cancer.gov  American Cancer Society: http://www.cancer.org Questions to ask Here are some questions to ask about each surgery:  What will my recovery be like?  How will my breast look and feel?  What are the possible risks and complications of the surgery?  What additional treatment might I need after surgery?  What are the risks and complications of radiation therapy?  What are the risks and complications of chemotherapy?  Will I be able to have breast reconstruction? Summary  Surgery is usually the first treatment for early-stage breast cancer. Most women have two surgery options.  One option is called partial mastectomy, or breast-sparing or breast-conserving surgery, and the other is called mastectomy. Both surgeries have good survival rates.  Each option has advantages and disadvantages to consider. The best treatment for one person might not be the best treatment for you.  Learn as much as you can about your cancer and work closely with your health care providers to make the choice that produces the best results for you. This information is not intended to replace advice given to you by your health care provider. Make sure you discuss any questions you have with your health care provider. Document Released: 12/01/2003 Document Revised: 12/06/2016 Document Reviewed: 12/06/2016 Elsevier Interactive Patient Education  2019  [employer].

## 2018-09-16 NOTE — Progress Notes (Signed)
[facility] History and Physical  Reason for Referral:Left breast cancer  Referring Physician: Dr. Delton Coombes   Chief Complaint    Breast Cancer      Darlene Moore is a 72 y.o. female.  HPI: Darlene Moore is a 72 yo with a newly diagnosed left breast cancer, Stage I. She had a screening mammogram and this area was found measuring about 1cm in size. 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 She reports no history of lumps or bumps, nipple changes, drainage or prior abnormal mammograms. She denies any prior biopsies. She has 2 sisters and a aunt that had breast cancer.  She reports that she had menarche at about 61, her first pregnancy at 87 (G61P1), did not breastfeed, and has undergone menopause. She has a history of "focal seizures" and reports a recent seizure with fall that resulted in an ED visit. She had a minor subdural and she was sent home from the ED.  She reports some occasional dizziness at times.   Past Medical History:  Diagnosis Date  . High cholesterol   . Medical history non-contributory   . Seizures ([place])     Past Surgical History:  Procedure Laterality Date  . CHOLECYSTECTOMY    . COLONOSCOPY    . COLONOSCOPY N/A 09/29/2015   Procedure: COLONOSCOPY;  Surgeon: Rogene Houston, MD;  [facility];  Service: Endoscopy;  Laterality: N/A;  730 - moved to 9:00 - Ann to notify pt    Family History  Problem Relation Age of Onset  . Breast cancer Sister   . Liver cancer Sister   . Diabetes Mother   . Heart disease Father   . Seizures Other        maternal    Social History   Tobacco Use  . Smoking status: Never Smoker  . Smokeless tobacco: Never Used  Substance Use Topics  . Alcohol use: No  . Drug use: No    Medications: I have reviewed the patient's current medications. Allergies as of 09/16/2018   No Known Allergies     Medication List       Accurate as of September 16, 2018 10:12 AM. Always use your  most recent med list.        CINNAMON PO Take by mouth. 2 tabs daily   ibuprofen 200 MG tablet Commonly known as:  ADVIL,MOTRIN Take 400 mg by mouth 3 times/day as needed-between meals & bedtime for mild pain.   levETIRAcetam 500 MG 24 hr tablet Commonly known as:  KEPPRA XR Take 3 tablets (1,500 mg total) by mouth 2 (two) times daily.   multivitamin tablet Take 1 tablet by mouth daily.   PROBIOTIC PO Take 1 capsule by mouth daily.        ROS:  A comprehensive review of systems was negative except for: Neurological: positive for seizures  Blood pressure 128/78, pulse 66, temperature (!) 97.1 F (36.2 C), temperature source Temporal, resp. rate 20, weight 210 lb 6.4 oz (95.4 kg). Physical Exam Vitals signs reviewed.  Constitutional:      Appearance: Normal appearance.  HENT:     Head: Normocephalic and atraumatic.     Mouth/Throat:     Mouth: Mucous membranes are moist.  Eyes:     Extraocular Movements: Extraocular movements intact.     Pupils: Pupils are equal, round, and reactive to light.  Neck:     Musculoskeletal: Normal range of motion.  Cardiovascular:     Rate  and Rhythm: Normal rate and regular rhythm.     Heart sounds: Murmur present.     Comments: Systolic murmur Pulmonary:     Effort: Pulmonary effort is normal.     Breath sounds: Normal breath sounds.  Chest:     Breasts:        Right: Normal. No swelling, inverted nipple, mass, nipple discharge or skin change.        Left: Normal. No swelling, inverted nipple, mass, nipple discharge or skin change.     Comments: Bruising on left breast, 10 o clock position Abdominal:     General: There is no distension.     Palpations: Abdomen is soft.     Tenderness: There is no abdominal tenderness.  Musculoskeletal: Normal range of motion.        General: No swelling.  Lymphadenopathy:     Upper Body:     Right upper body: No supraclavicular or axillary adenopathy.     Left upper body: No supraclavicular  or axillary adenopathy.  Skin:    General: Skin is warm and dry.  Neurological:     General: No focal deficit present.     Mental Status: She is alert and oriented to person, place, and time.  Psychiatric:        Mood and Affect: Mood normal.        Behavior: Behavior normal.        Thought Content: Thought content normal.        Judgment: Judgment normal.     Results: 08/2018 CLINICAL DATA:  Post ultrasound-guided biopsy of the mass in the upper far inner in far posterior left breast at the 10 o'clock position.  EXAM: DIAGNOSTIC LEFT MAMMOGRAM POST ULTRASOUND BIOPSY  COMPARISON:  Previous exam(s).  FINDINGS: Mammographic images were obtained following ultrasound guided biopsy of a suspicious mass in the upper inner far posterior left breast. A spiral shaped HydroMARK clip is identified at the site of the biopsied mass in the left breast on the MLO view, however could not be included in the field of view on the CC images due to the far posterior location.  IMPRESSION: 1. Spiral shaped HydroMARK clip at site of biopsied mass in the left breast on the MLO view, however could not be included in the field of view on the CC images due to the far posteromedial location of the mass.  2. If this mass needs to be localized for surgery, then would recommend ultrasound-guided localization due to the far posteromedial location of the mass with non visualization of the biopsy marking clip on the CC images.  Final Assessment: Post Procedure Mammograms for Marker Placement  Assessment & Plan:  Darlene Moore is a 72 y.o. female with a early stage left breast cancer.  She has already seen Dr. Delton Coombes. She has no signs of lymph node invasion.    -We have discussed the options for surgery including the option of mastectomy with sentinel node biopsy versus partial mastectomy (lumpectomy) with sentinel node biopsy. We have discussed that there is no difference in the prognosis  or chance or recurrence or differences in survival between the two options. We have discussed the need for radiation with the lumpectomy, and we have discussed that she will be referred to oncology after our procedure to further discuss her options for chemotherapy and hormonal therapy if she qualifies.   We have discussed that if she decides to have a lumpectomy that we will need to get a needle  placed into the area where the biopsy was performed, since we cannot palpate a mass. We have also discussed the need for injection of radiotracer and blue dye to perform the sentinel node biopsy.  We have discussed that the sentinel node biopsy tells [place] if the cancer has spread to the lymph nodes, and can help with plans for chemotherapy treatment and overall prognosis.    We have discussed that if the lumpectomy does not remove the entire cancer that she may have to have an additional procedure, and we have discussed that a positive sentinel node can require further removal of lymph nodes from the axilla but that recent research does not show any improvement in disease free survival and carries greater risk for lymphedema.    We have discussed that these are big discussions, and that the risk from the operations are similar including risk of bleeding, risk of infection, and risk of needing additional surgeries. We have discussed the likely need for an overnight stay with a mastectomy and a drain that will remain in place for about 1 week.    Plans for lumpectomy and sentinel node biopsy after needle localization and radiotracer placement from radiology.    All questions were answered to the satisfaction of the patient and family.  Future Appointments  Date Time Provider [place]  09/26/2018  8:00 AM [facility] PAT 2 [facility] None  10/01/2018  7:30 AM AP-NM INJ 1 AP-NM [place] [facility]  10/01/2018  8:00 AM AP-MM 1 AP-MM [place] [facility]  10/28/2018 10:45 AM Derek Jack, MD [facility]-AC[facility] None  11/06/2018 10:00  AM Clarene Essex, Counselor AP[facility] None  11/06/2018 11:10 AM AP-[facility] LAB [facility] None  02/02/2019 10:30 AM Penumalli, Earlean Polka, MD [facility] None     Virl Cagey 09/16/2018, 10:12 AM

## 2018-09-16 NOTE — H&P (Signed)
[facility] History and Physical  Reason for Referral:Left breast cancer  Referring Physician: Dr. Delton Coombes      Chief Complaint    Breast Cancer      Darlene Moore is a 72 y.o. female.  HPI: Darlene Moore is a 72 yo with a newly diagnosed left breast cancer, Stage I. She had a screening mammogram and this area was found measuring about 1cm in size. 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 She reports no history of lumps or bumps, nipple changes, drainage or prior abnormal mammograms. She denies any prior biopsies. She has 2 sisters and a aunt that had breast cancer.  She reports that she had menarche at about 40, her first pregnancy at 80 (G6P1), did not breastfeed, and has undergone menopause. She has a history of "focal seizures" and reports a recent seizure with fall that resulted in an ED visit. She had a minor subdural and she was sent home from the ED.  She reports some occasional dizziness at times.       Past Medical History:  Diagnosis Date  . High cholesterol   . Medical history non-contributory   . Seizures ([place])          Past Surgical History:  Procedure Laterality Date  . CHOLECYSTECTOMY    . COLONOSCOPY    . COLONOSCOPY N/A 09/29/2015   Procedure: COLONOSCOPY;  Surgeon: Rogene Houston, MD;  [facility];  Service: Endoscopy;  Laterality: N/A;  730 - moved to 9:00 - Ann to notify pt         Family History  Problem Relation Age of Onset  . Breast cancer Sister   . Liver cancer Sister   . Diabetes Mother   . Heart disease Father   . Seizures Other        maternal    Social History       Tobacco Use  . Smoking status: Never Smoker  . Smokeless tobacco: Never Used  Substance Use Topics  . Alcohol use: No  . Drug use: No    Medications: I have reviewed the patient's current medications. Allergies as of 09/16/2018   No Known Allergies        Medication List         Accurate as of September 16, 2018 10:12 AM. Always use your most recent med list.        CINNAMON PO Take by mouth. 2 tabs daily   ibuprofen 200 MG tablet Commonly known as:  ADVIL,MOTRIN Take 400 mg by mouth 3 times/day as needed-between meals & bedtime for mild pain.   levETIRAcetam 500 MG 24 hr tablet Commonly known as:  KEPPRA XR Take 3 tablets (1,500 mg total) by mouth 2 (two) times daily.   multivitamin tablet Take 1 tablet by mouth daily.   PROBIOTIC PO Take 1 capsule by mouth daily.        ROS:  A comprehensive review of systems was negative except for: Neurological: positive for seizures  Blood pressure 128/78, pulse 66, temperature (!) 97.1 F (36.2 C), temperature source Temporal, resp. rate 20, weight 210 lb 6.4 oz (95.4 kg). Physical Exam Vitals signs reviewed.  Constitutional:      Appearance: Normal appearance.  HENT:     Head: Normocephalic and atraumatic.     Mouth/Throat:     Mouth: Mucous membranes are moist.  Eyes:     Extraocular Movements: Extraocular movements intact.     Pupils: Pupils  are equal, round, and reactive to light.  Neck:     Musculoskeletal: Normal range of motion.  Cardiovascular:     Rate and Rhythm: Normal rate and regular rhythm.     Heart sounds: Murmur present.     Comments: Systolic murmur Pulmonary:     Effort: Pulmonary effort is normal.     Breath sounds: Normal breath sounds.  Chest:     Breasts:        Right: Normal. No swelling, inverted nipple, mass, nipple discharge or skin change.        Left: Normal. No swelling, inverted nipple, mass, nipple discharge or skin change.     Comments: Bruising on left breast, 10 o clock position Abdominal:     General: There is no distension.     Palpations: Abdomen is soft.     Tenderness: There is no abdominal tenderness.  Musculoskeletal: Normal range of motion.        General: No swelling.  Lymphadenopathy:     Upper Body:     Right upper body: No  supraclavicular or axillary adenopathy.     Left upper body: No supraclavicular or axillary adenopathy.  Skin:    General: Skin is warm and dry.  Neurological:     General: No focal deficit present.     Mental Status: She is alert and oriented to person, place, and time.  Psychiatric:        Mood and Affect: Mood normal.        Behavior: Behavior normal.        Thought Content: Thought content normal.        Judgment: Judgment normal.     Results: 08/2018 CLINICAL DATA: Post ultrasound-guided biopsy of the mass in the upper far inner in far posterior left breast at the 10 o'clock position.  EXAM: DIAGNOSTIC LEFT MAMMOGRAM POST ULTRASOUND BIOPSY  COMPARISON: Previous exam(s).  FINDINGS: Mammographic images were obtained following ultrasound guided biopsy of a suspicious mass in the upper inner far posterior left breast. A spiral shaped HydroMARK clip is identified at the site of the biopsied mass in the left breast on the MLO view, however could not be included in the field of view on the CC images due to the far posterior location.  IMPRESSION: 1. Spiral shaped HydroMARK clip at site of biopsied mass in the left breast on the MLO view, however could not be included in the field of view on the CC images due to the far posteromedial location of the mass.  2. If this mass needs to be localized for surgery, then would recommend ultrasound-guided localization due to the far posteromedial location of the mass with non visualization of the biopsy marking clip on the CC images.  Final Assessment: Post Procedure Mammograms for Marker Placement  Assessment & Plan:  [name] is a 72 y.o. female with a early stage left breast cancer.  She has already seen Dr. Delton Coombes. She has no signs of lymph node invasion.    -We have discussed the options for surgery including the option of mastectomy with sentinel node biopsy versus partial mastectomy (lumpectomy)  with sentinel node biopsy. We have discussed that there is no difference in the prognosis or chance or recurrence or differences in survival between the two options. We have discussed the need for radiation with the lumpectomy, and we have discussed that she will be referred to oncology after our procedure to further discuss her options for chemotherapy and hormonal therapy if  she qualifies.   We have discussed that if she decides to have a lumpectomy that we will need to get a needle placed into the area where the biopsy was performed, since we cannot palpate a mass. We have also discussed the need for injection of radiotracer and blue dye to perform the sentinel node biopsy.  We have discussed that the sentinel node biopsy tells [place] if the cancer has spread to the lymph nodes, and can help with plans for chemotherapy treatment and overall prognosis.    We have discussed that if the lumpectomy does not remove the entire cancer that she may have to have an additional procedure, and we have discussed that a positive sentinel node can require further removal of lymph nodes from the axilla but that recent research does not show any improvement in disease free survival and carries greater risk for lymphedema.    We have discussed that these are big discussions, and that the risk from the operations are similar including risk of bleeding, risk of infection, and risk of needing additional surgeries. We have discussed the likely need for an overnight stay with a mastectomy and a drain that will remain in place for about 1 week.    Plans for lumpectomy and sentinel node biopsy after needle localization and radiotracer placement from radiology.    All questions were answered to the satisfaction of the patient and family.        Future Appointments  Date Time Provider [place]  09/26/2018  8:00 AM [facility] PAT 2 [facility]-DO[facility] None  10/01/2018  7:30 AM AP-NM INJ 1 AP-NM [place] H  10/01/2018  8:00  AM AP-MM 1 AP-MM [place] H  10/28/2018 10:45 AM Derek Jack, MD [facility] None  11/06/2018 10:00 AM Clarene Essex, Counselor [facility] None  11/06/2018 11:10 AM [facility] LAB [facility] None  02/02/2019 10:30 AM Penumalli, Earlean Polka, MD [facility] None     Virl Cagey 09/16/2018, 10:12 AM

## 2018-09-18 ENCOUNTER — Telehealth: Payer: Self-pay | Admitting: Nurse Practitioner

## 2018-09-18 NOTE — Telephone Encounter (Signed)
Pt called stating she had a seizure last Saturday 12/21. Went to the ED to have an evaluation- CT and EEG came back normal, few dizzy spells. Pt stating she hasn't missed any dosing for Keppra. Please advise.

## 2018-09-18 NOTE — Telephone Encounter (Signed)
Spoke to pt.  She relayed that she had syncope episode on 09-13-18 when alone.  Thinks is may have been seizure.  Went to ED, see notes.  Has been compliant in taking Keppra 1500mg  po BID.  No alcohol.  Feels like has been sleeping ok.  Has had new stressors, (newly diagnosed with Breast Cancer, other -not mentioned).   Made appt for 09-22-18 at 0915, arrive 0845, will need driver.  Pt will call to cancel if cannot find driver.  She is doing ok at this time.
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
## 2018-09-22 ENCOUNTER — Encounter: Payer: Self-pay | Admitting: Nurse Practitioner

## 2018-09-22 ENCOUNTER — Ambulatory Visit: Payer: Medicare HMO | Admitting: Nurse Practitioner

## 2018-09-22 VITALS — BP 120/64 | HR 60 | Ht 65.0 in | Wt 212.6 lb

## 2018-09-22 DIAGNOSIS — G40109 Localization-related (focal) (partial) symptomatic epilepsy and epileptic syndromes with simple partial seizures, not intractable, without status epilepticus: Secondary | ICD-10-CM

## 2018-09-22 DIAGNOSIS — Z5181 Encounter for therapeutic drug level monitoring: Secondary | ICD-10-CM | POA: Insufficient documentation
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
## 2018-09-24 LAB — LEVETIRACETAM LEVEL: Levetiracetam Lvl: 41.1 ug/mL — ABNORMAL HIGH (ref 10.0–40.0)

## 2018-09-25 ENCOUNTER — Other Ambulatory Visit: Payer: Self-pay | Admitting: Nurse Practitioner

## 2018-09-25 ENCOUNTER — Telehealth: Payer: Self-pay | Admitting: *Deleted

## 2018-09-25 MED ORDER — LEVETIRACETAM ER 500 MG PO TB24: 1500.0000 mg | ORAL_TABLET | Freq: Two times a day (BID) | ORAL | 2 refills | Status: DC

## 2018-09-25 NOTE — Telephone Encounter (Signed)
-----   Message from Dennie Bible, NP sent at 09/25/2018  9:47 AM EST ----- Keppra level at high end of therapeutic range.  Call for any further events.  Continue same dose

## 2018-09-25 NOTE — Telephone Encounter (Signed)
Spoke to pt and relayed that her keppra was level was high end therapeutic range.  She is to continue taking same dose.  She is still having dizziness.  This coming Saturday will be 2 wks from fall, hitting head.  ? Getting CT.  She will see how she does and contact pcp next week if no better.  Will keep her cancer doctor informed about what is happening to her as well.

## 2018-09-26 ENCOUNTER — Encounter (HOSPITAL_COMMUNITY): Payer: Self-pay

## 2018-09-26 ENCOUNTER — Encounter (HOSPITAL_COMMUNITY)
Admission: RE | Admit: 2018-09-26 | Discharge: 2018-09-26 | Disposition: A | Discharge status: Home / Self Care | Payer: Medicare HMO | Source: Ambulatory Visit | Attending: General Surgery | Admitting: General Surgery

## 2018-09-30 ENCOUNTER — Other Ambulatory Visit (HOSPITAL_COMMUNITY): Payer: Self-pay | Admitting: General Surgery

## 2018-10-01 ENCOUNTER — Ambulatory Visit (HOSPITAL_COMMUNITY)
Admission: RE | Admit: 2018-10-01 | Discharge: 2018-10-01 | Disposition: A | Discharge status: Home / Self Care | Payer: Medicare HMO | Source: Ambulatory Visit | Attending: General Surgery | Admitting: General Surgery

## 2018-10-01 ENCOUNTER — Ambulatory Visit (HOSPITAL_COMMUNITY): Payer: Medicare HMO | Admitting: Anesthesiology

## 2018-10-01 ENCOUNTER — Encounter (HOSPITAL_COMMUNITY)
Admission: RE | Disposition: A | Discharge status: Home / Self Care | Payer: Self-pay | Source: Home / Self Care | Attending: General Surgery

## 2018-10-01 ENCOUNTER — Ambulatory Visit (HOSPITAL_COMMUNITY)
Admission: RE | Admit: 2018-10-01 | Discharge: 2018-10-01 | Disposition: A | Discharge status: Home / Self Care | Payer: Medicare HMO | Attending: General Surgery | Admitting: General Surgery

## 2018-10-01 ENCOUNTER — Ambulatory Visit (HOSPITAL_COMMUNITY): Payer: Medicare HMO

## 2018-10-01 ENCOUNTER — Encounter (HOSPITAL_COMMUNITY): Payer: Self-pay | Admitting: Anesthesiology

## 2018-10-01 DIAGNOSIS — Z853 Personal history of malignant neoplasm of breast: Secondary | ICD-10-CM

## 2018-10-01 DIAGNOSIS — Z803 Family history of malignant neoplasm of breast: Secondary | ICD-10-CM | POA: Insufficient documentation

## 2018-10-01 DIAGNOSIS — C50912 Malignant neoplasm of unspecified site of left female breast: Secondary | ICD-10-CM | POA: Diagnosis not present

## 2018-10-01 DIAGNOSIS — R569 Unspecified convulsions: Secondary | ICD-10-CM | POA: Diagnosis not present

## 2018-10-01 DIAGNOSIS — Z79899 Other long term (current) drug therapy: Secondary | ICD-10-CM | POA: Insufficient documentation

## 2018-10-01 DIAGNOSIS — C50212 Malignant neoplasm of upper-inner quadrant of left female breast: Secondary | ICD-10-CM | POA: Insufficient documentation

## 2018-10-01 DIAGNOSIS — Z170 Estrogen receptor positive status [ER+]: Secondary | ICD-10-CM | POA: Diagnosis not present

## 2018-10-01 DIAGNOSIS — R928 Other abnormal and inconclusive findings on diagnostic imaging of breast: Secondary | ICD-10-CM

## 2018-10-01 HISTORY — PX: PARTIAL MASTECTOMY WITH NEEDLE LOCALIZATION AND AXILLARY SENTINEL LYMPH NODE BX: SHX6009

## 2018-10-01 SURGERY — PARTIAL MASTECTOMY WITH NEEDLE LOCALIZATION AND AXILLARY SENTINEL LYMPH NODE BX
Anesthesia: General | Site: Breast | Laterality: Left

## 2018-10-01 MED ORDER — LACTATED RINGERS IV SOLN: INTRAVENOUS | Status: DC

## 2018-10-01 MED ORDER — ONDANSETRON HCL 4 MG/2ML IJ SOLN
INTRAMUSCULAR | Status: DC | PRN
  Administered 2018-10-01: 4 mg via INTRAVENOUS

## 2018-10-01 MED ORDER — MIDAZOLAM HCL 5 MG/5ML IJ SOLN
INTRAMUSCULAR | Status: DC | PRN
  Administered 2018-10-01: 2 mg via INTRAVENOUS

## 2018-10-01 MED ORDER — HYDROMORPHONE HCL 1 MG/ML IJ SOLN
0.2500 mg | INTRAMUSCULAR | Status: DC | PRN
  Filled 2018-10-01: qty 0.5

## 2018-10-01 MED ORDER — CHLORHEXIDINE GLUCONATE CLOTH 2 % EX PADS: 6.0000 | MEDICATED_PAD | Freq: Once | CUTANEOUS | Status: DC

## 2018-10-01 MED ORDER — FENTANYL CITRATE (PF) 100 MCG/2ML IJ SOLN
INTRAMUSCULAR | Status: DC | PRN
  Administered 2018-10-01 (×3): 25 ug via INTRAVENOUS
  Administered 2018-10-01 (×3): 50 ug via INTRAVENOUS

## 2018-10-01 MED ORDER — ONDANSETRON HCL 4 MG/2ML IJ SOLN
INTRAMUSCULAR | Status: AC
  Filled 2018-10-01: qty 2

## 2018-10-01 MED ORDER — METHYLENE BLUE 0.5 % INJ SOLN
INTRAVENOUS | Status: AC
  Filled 2018-10-01: qty 10

## 2018-10-01 MED ORDER — SODIUM CHLORIDE (PF) 0.9 % IJ SOLN
INTRAMUSCULAR | Status: AC
  Filled 2018-10-01: qty 40

## 2018-10-01 MED ORDER — MEPERIDINE HCL 50 MG/ML IJ SOLN
6.2500 mg | INTRAMUSCULAR | Status: DC | PRN
  Administered 2018-10-01: 12.5 mg via INTRAVENOUS
  Filled 2018-10-01: qty 1

## 2018-10-01 MED ORDER — PROPOFOL 10 MG/ML IV BOLUS
INTRAVENOUS | Status: DC | PRN
  Administered 2018-10-01: 140 mg via INTRAVENOUS

## 2018-10-01 MED ORDER — PROPOFOL 10 MG/ML IV BOLUS
INTRAVENOUS | Status: AC
  Filled 2018-10-01: qty 20

## 2018-10-01 MED ORDER — CEFAZOLIN SODIUM-DEXTROSE 2-4 GM/100ML-% IV SOLN
2.0000 g | INTRAVENOUS | Status: AC
  Administered 2018-10-01: 2 g via INTRAVENOUS

## 2018-10-01 MED ORDER — PENTAFLUOROPROP-TETRAFLUOROETH EX AERO
INHALATION_SPRAY | CUTANEOUS | Status: AC
  Filled 2018-10-01: qty 116

## 2018-10-01 MED ORDER — TECHNETIUM TC 99M SULFUR COLLOID FILTERED
0.5000 | Freq: Once | INTRAVENOUS | Status: AC | PRN
  Administered 2018-10-01: 0.5 via INTRADERMAL

## 2018-10-01 MED ORDER — PROMETHAZINE HCL 25 MG/ML IJ SOLN: 6.2500 mg | INTRAMUSCULAR | Status: DC | PRN

## 2018-10-01 MED ORDER — HYDROCODONE-ACETAMINOPHEN 7.5-325 MG PO TABS: 1.0000 | ORAL_TABLET | Freq: Once | ORAL | Status: DC | PRN

## 2018-10-01 MED ORDER — 0.9 % SODIUM CHLORIDE (POUR BTL) OPTIME
TOPICAL | Status: DC | PRN
  Administered 2018-10-01: 1000 mL

## 2018-10-01 MED ORDER — BUPIVACAINE HCL (PF) 0.5 % IJ SOLN
INTRAMUSCULAR | Status: DC | PRN
  Administered 2018-10-01: 20 mL

## 2018-10-01 MED ORDER — TECHNETIUM TC 99M SULFUR COLLOID FILTERED
0.5000 | Freq: Once | INTRAVENOUS | Status: AC | PRN
  Administered 2018-10-01: 0.54 via INTRADERMAL

## 2018-10-01 MED ORDER — OXYCODONE HCL 5 MG PO TABS: 5.0000 mg | ORAL_TABLET | ORAL | 0 refills | Status: DC | PRN

## 2018-10-01 MED ORDER — BUPIVACAINE HCL (PF) 0.5 % IJ SOLN
INTRAMUSCULAR | Status: AC
  Filled 2018-10-01: qty 30

## 2018-10-01 MED ORDER — LACTATED RINGERS IV SOLN
INTRAVENOUS | Status: DC | PRN
  Administered 2018-10-01 (×2): via INTRAVENOUS

## 2018-10-01 MED ORDER — SODIUM CHLORIDE (PF) 0.9 % IJ SOLN
INTRAVENOUS | Status: DC | PRN
  Administered 2018-10-01: 5 mL via INTRAMUSCULAR

## 2018-10-01 MED ORDER — CEFAZOLIN SODIUM-DEXTROSE 2-4 GM/100ML-% IV SOLN
INTRAVENOUS | Status: AC
  Filled 2018-10-01: qty 100

## 2018-10-01 MED ORDER — MIDAZOLAM HCL 2 MG/2ML IJ SOLN
INTRAMUSCULAR | Status: AC
  Filled 2018-10-01: qty 2

## 2018-10-01 MED ORDER — FENTANYL CITRATE (PF) 250 MCG/5ML IJ SOLN
INTRAMUSCULAR | Status: AC
  Filled 2018-10-01: qty 5

## 2018-10-01 MED ORDER — LIDOCAINE HCL (PF) 2 % IJ SOLN
INTRAMUSCULAR | Status: AC
  Administered 2018-10-01: 10:00:00
  Filled 2018-10-01: qty 10

## 2018-10-01 MED ORDER — DOCUSATE SODIUM 100 MG PO CAPS: 100.0000 mg | ORAL_CAPSULE | Freq: Two times a day (BID) | ORAL | 2 refills | Status: DC

## 2018-10-01 SURGICAL SUPPLY — 34 items
APPLIER CLIP 9.375 SM OPEN (CLIP) ×6
CHLORAPREP W/TINT 26ML (MISCELLANEOUS) ×3 IMPLANT
CLIP APPLIE 9.375 SM OPEN (CLIP) ×1 IMPLANT
CLOTH BEACON ORANGE TIMEOUT ST (SAFETY) ×3 IMPLANT
COVER LIGHT HANDLE STERIS (MISCELLANEOUS) ×6 IMPLANT
COVER PROBE W GEL 5X96 (DRAPES) ×3 IMPLANT
DECANTER SPIKE VIAL GLASS SM (MISCELLANEOUS) ×3 IMPLANT
DERMABOND ADVANCED (GAUZE/BANDAGES/DRESSINGS) ×2
DERMABOND ADVANCED .7 DNX12 (GAUZE/BANDAGES/DRESSINGS) ×1 IMPLANT
DRAPE UTILITY W/TAPE 26X15 (DRAPES) ×2 IMPLANT
ELECT REM PT RETURN 9FT ADLT (ELECTROSURGICAL) ×3
ELECTRODE REM PT RTRN 9FT ADLT (ELECTROSURGICAL) ×1 IMPLANT
GLOVE BIO SURGEON STRL SZ 6.5 (GLOVE) ×2 IMPLANT
GLOVE BIO SURGEONS STRL SZ 6.5 (GLOVE) ×1
GLOVE BIOGEL PI IND STRL 6.5 (GLOVE) ×1 IMPLANT
GLOVE BIOGEL PI IND STRL 7.0 (GLOVE) ×1 IMPLANT
GLOVE BIOGEL PI INDICATOR 6.5 (GLOVE) ×2
GLOVE BIOGEL PI INDICATOR 7.0 (GLOVE) ×2
GLOVE ECLIPSE 6.5 STRL STRAW (GLOVE) ×6 IMPLANT
GOWN STRL REUS W/TWL LRG LVL3 (GOWN DISPOSABLE) ×9 IMPLANT
KIT TURNOVER KIT A (KITS) ×3 IMPLANT
MANIFOLD NEPTUNE II (INSTRUMENTS) ×3 IMPLANT
NDL HYPO 25X1 1.5 SAFETY (NEEDLE) ×2 IMPLANT
NEEDLE HYPO 25X1 1.5 SAFETY (NEEDLE) ×6 IMPLANT
NS IRRIG 1000ML POUR BTL (IV SOLUTION) ×3 IMPLANT
PACK MINOR (CUSTOM PROCEDURE TRAY) ×3 IMPLANT
PAD ARMBOARD 7.5X6 YLW CONV (MISCELLANEOUS) ×3 IMPLANT
SET BASIN LINEN APH (SET/KITS/TRAYS/PACK) ×3 IMPLANT
SPONGE LAP 18X18 RF (DISPOSABLE) ×3 IMPLANT
SUT MNCRL AB 4-0 PS2 18 (SUTURE) ×3 IMPLANT
SUT VIC AB 3-0 SH 27 (SUTURE) ×4
SUT VIC AB 3-0 SH 27X BRD (SUTURE) ×1 IMPLANT
SYR BULB IRRIGATION 50ML (SYRINGE) ×3 IMPLANT
SYR CONTROL 10ML LL (SYRINGE) ×6 IMPLANT

## 2018-10-01 NOTE — Transfer of Care (Signed)
Immediate Anesthesia Transfer of Care Note  Patient: Darlene Moore  Procedure(s) Performed: LEFT BREAST PARTIAL MASTECTOMY WITH SENTINEL LYMPH NODE BIOPSY AFTER NEEDLE LOCALIZATION AND RADIOTRACER PLACEMENT (Left Breast)  Patient Location: PACU  Anesthesia Type:General  Level of Consciousness: awake and alert   Airway & Oxygen Therapy: Patient Spontanous Breathing  Post-op Assessment: Report given to RN  Post vital signs: Reviewed and stable  Last Vitals:  Vitals Value Taken Time  BP    Temp    Pulse 63 10/01/2018 12:47 PM  Resp 16 10/01/2018 12:47 PM  SpO2 96 % 10/01/2018 12:47 PM  Vitals shown include unvalidated device data.  Last Pain:  Vitals:   10/01/18 0955  PainSc: 0-No pain         Complications: No apparent anesthesia complications

## 2018-10-01 NOTE — Progress Notes (Signed)
Nuclear Med notified that pt is ready for sentinal node procedure. Magda Paganini will becoming to do procedure.

## 2018-10-01 NOTE — Progress Notes (Signed)
Nuclear Med at bedside to do procedure.

## 2018-10-01 NOTE — Progress Notes (Signed)
Contacted Mary in [place], informed her pt ready for needle localization. She stated that the needle localization is to be done in [place] per Dr [place] Papas. Tech in [place] notified pt ready for needle localization. She stated that she was unaware of this procedure was to be done with her. She verified this with Stanton Kidney in [place]. Stated it would be 15-20 minutes before she would be ready in [place]. Informed her to call me in pre-op when ready for pt. Voiced understanding. Caren Griffins in [place] notified of situation. Pt family at bedside.

## 2018-10-01 NOTE — Anesthesia Preprocedure Evaluation (Signed)
Anesthesia Evaluation    Airway Mallampati: II   Neck ROM: full    Dental  (+) Teeth Intact, Dental Advidsory Given   Pulmonary    breath sounds clear to auscultation       Cardiovascular  Rhythm:regular     Neuro/Psych Seizures -, Well Controlled,     GI/Hepatic   Endo/Other    Renal/GU      Musculoskeletal   Abdominal   Peds  Hematology   Anesthesia Other Findings Recently dx Stage 1 breast CA   Reproductive/Obstetrics                             Anesthesia Physical Anesthesia Plan  ASA: III  Anesthesia Plan: General   Post-op Pain Management:    Induction:   PONV Risk Score and Plan:   Airway Management Planned:   Additional Equipment:   Intra-op Plan:   Post-operative Plan:   Informed Consent: I have reviewed the patients History and Physical, chart, labs and discussed the procedure including the risks, benefits and alternatives for the proposed anesthesia with the patient or authorized representative who has indicated his/her understanding and acceptance.     Plan Discussed with: Anesthesiologist  Anesthesia Plan Comments:         Anesthesia Quick Evaluation

## 2018-10-01 NOTE — Discharge Instructions (Signed)
Common Complaints: Some nausea is common and poor appetite. The main goal is to stay hydrated the first few days after surgery.  You will have pain at your incision site and tightness. Slowly start to move your arm and stretch out your armpit.   Diet/ Activity: Diet as tolerated. You may not have an appetite, but it is important to stay hydrated. Drink 64 ounces of water a day. Your appetite will return with time.  Shower per your regular routine daily.  Do not take hot showers. Take warm showers that are less than 10 minutes. Rest and listen to your body, but do not remain in bed all day.  Walk everyday for at least 15-20 minutes. Deep cough and move around every 1-2 hours in the first few days after surgery.  Do not lift > 10 lbs, perform excessive pushing, pulling with the operated side until seen by Dr. Constance Haw.  Do not pick at the dermabond glue on your incision sites.  This glue film will remain in place for 1-2 weeks and will start to peel off.  Do not place lotions or balms on your incision unless instructed to specifically by Dr. Constance Haw.   Medication: Take tylenol and ibuprofen as needed for pain control, alternating every 4-6 hours.  Example:  Tylenol 1000mg  @ 6am, 12noon, 6pm, 75midnight (Do not exceed 4000mg  of tylenol a day). Ibuprofen 800mg  @ 9am, 3pm, 9pm, 3am (Do not exceed 3600mg  of ibuprofen a day).  Take Roxicodone for breakthrough pain every 4 hours.  Take Colace for constipation related to narcotic pain medication. If you do not have a bowel movement in 2 days, take Miralax over the counter.  Drink plenty of water to also prevent constipation.   Contact Information: If you have questions or concerns, please call our office, [phone], Monday- Thursday 8AM-5PM and Friday 8AM-12Noon.  If it is after hours or on the weekend, please call [facility], [phone], and ask to speak to the surgeon on call for Dr. Constance Haw at [facility].    Lumpectomy, Care  After This sheet gives you information about how to care for yourself after your procedure. Your health care provider may also give you more specific instructions. If you have problems or questions, contact your health care provider. What can I expect after the procedure? After the procedure, it is common to have:  Breast swelling.  Breast tenderness.  Stiffness in your arm or shoulder.  A change in the shape and feel of your breast.  Scar tissue that feels hard to the touch in the area where the lump was removed. Follow these instructions at home: Medicines  Take over-the-counter and prescription medicines only as told by your health care provider.  Take your antibiotic medicine as told by your health care provider. Do not stop taking the antibiotic even if you start to feel better.  If you are taking prescription pain medicine, take actions to prevent or treat constipation. Your health care provider may recommend that you: ? Drink enough fluid to keep your urine pale yellow. ? Eat foods that are high in fiber, such as fresh fruits and vegetables, whole grains, and beans. ? Limit foods that are high in fat and processed sugars, such as fried and sweet foods. ? Take an over-the-counter or prescription medicine for constipation. Bathing  Do not take baths, swim, or use a hot tub until your health care provider approves.  You may shower.  Incision care      Follow instructions  from your health care provider about how to take care of your incision. Make sure you: ? Wash your hands with soap and water before you change your bandage (dressing). If soap and water are not available, use hand sanitizer. ? Change your dressing as told by your health care provider. ? Leave stitches (sutures), skin glue, or adhesive strips in place. These skin closures may need to stay in place for 2 weeks or longer. If adhesive strip edges start to loosen and curl up, you may trim the loose edges. Do not  remove adhesive strips completely unless your health care provider tells you to do that.  Check your incision area every day for signs of infection. Check for: ? Redness, swelling, or pain. ? Fluid or blood. ? Warmth. ? Pus or a bad smell.  Keep your dressing clean and dry.  If you were sent home with a surgical drain in place, follow instructions from your health care provider about emptying it. Activity  Return to your normal activities as told by your health care provider. Ask your health care provider what activities are safe for you.  Be careful to avoid any activities that could cause an injury to your arm on the side of your surgery.  Do not lift anything that is heavier than 10 lb (4.5 kg), or the limit that you are told, until your health care provider says that it is safe. Avoid lifting with the arm that is on the side of your surgery.  Do not carry heavy objects on your shoulder on the side of your surgery.  After your drain is removed, you should perform exercises to keep your arm from getting stiff and swollen. Talk with your health care provider about which exercises are safe for you. General instructions  Do not drive or use heavy machinery while taking prescription pain medicine.  Wear a supportive bra as told by your health care provider.  Raise (elevate) your arm above the level of your heart while you are sitting or lying down.  Do not wear tight jewelry on your arm, wrist, or fingers on the side of your surgery.  Keep all follow-up visits as told by your health care provider. This is important. ? You may need to be screened for extra fluid around the lymph nodes (lymphedema). Follow instructions from your health care provider about how often you should be checked.  If you had any lymph nodes removed during your procedure, be sure to tell all of your health care providers. This is important information to share before you are involved in certain procedures, such  as having blood tests or having your blood pressure taken. Contact a health care provider if:  You develop a rash.  You have a fever.  Your pain medicine is not working.  Your swelling, weakness, or numbness in your arm has not improved after a few weeks.  You have new swelling in your breast or arm.  You have redness, swelling, or pain in your incision area.  You have fluid or blood coming from your incision.  Your incision feels warm to the touch.  You have pus or a bad smell coming from your incision. Get help right away if:  You have very bad pain in your breast or arm.  You have chest pain.  You have difficulty breathing. Summary  After the procedure, it is common to have breast tenderness, swelling, and stiffness in your arm and shoulder.  Follow instructions from your health care  provider about how to take care of your incision.  Do not lift anything that is heavier than 10 lb (4.5 kg), or the limit that you are told, until your health care provider says that it is safe. Avoid lifting with the arm that is on the side of your surgery.  If you had any lymph nodes removed during your procedure, be sure to tell all of your health care providers. This is important information to share before you are involved in certain procedures, such as having blood tests or having your blood pressure taken. This information is not intended to replace advice given to you by your health care provider. Make sure you discuss any questions you have with your health care provider. Document Released: 09/26/2006 Document Revised: 01/07/2018 Document Reviewed: 05/23/2016 Elsevier Interactive Patient Education  2019 [place] Anesthesia, Adult, Care After This sheet gives you information about how to care for yourself after your procedure. Your health care provider may also give you more specific instructions. If you have problems or questions, contact your health care  provider. What can I expect after the procedure? After the procedure, the following side effects are common:  Pain or discomfort at the IV site.  Nausea.  Vomiting.  Sore throat.  Trouble concentrating.  Feeling cold or chills.  Weak or tired.  Sleepiness and fatigue.  Soreness and body aches. These side effects can affect parts of the body that were not involved in surgery. Follow these instructions at home:  For at least 24 hours after the procedure:  Have a responsible adult stay with you. It is important to have someone help care for you until you are awake and alert.  Rest as needed.  Do not: ? Participate in activities in which you could fall or become injured. ? Drive. ? Use heavy machinery. ? Drink alcohol. ? Take sleeping pills or medicines that cause drowsiness. ? Make important decisions or sign legal documents. ? Take care of children on your own. Eating and drinking  Follow any instructions from your health care provider about eating or drinking restrictions.  When you feel hungry, start by eating small amounts of foods that are soft and easy to digest (bland), such as toast. Gradually return to your regular diet.  Drink enough fluid to keep your urine pale yellow.  If you vomit, rehydrate by drinking water, juice, or clear broth. General instructions  If you have sleep apnea, surgery and certain medicines can increase your risk for breathing problems. Follow instructions from your health care provider about wearing your sleep device: ? Anytime you are sleeping, including during daytime naps. ? While taking prescription pain medicines, sleeping medicines, or medicines that make you drowsy.  Return to your normal activities as told by your health care provider. Ask your health care provider what activities are safe for you.  Take over-the-counter and prescription medicines only as told by your health care provider.  If you smoke, do not smoke  without supervision.  Keep all follow-up visits as told by your health care provider. This is important. Contact a health care provider if:  You have nausea or vomiting that does not get better with medicine.  You cannot eat or drink without vomiting.  You have pain that does not get better with medicine.  You are unable to pass urine.  You develop a skin rash.  You have a fever.  You have redness around your IV site that gets worse. Get  help right away if:  You have difficulty breathing.  You have chest pain.  You have blood in your urine or stool, or you vomit blood. Summary  After the procedure, it is common to have a sore throat or nausea. It is also common to feel tired.  Have a responsible adult stay with you for the first 24 hours after general anesthesia. It is important to have someone help care for you until you are awake and alert.  When you feel hungry, start by eating small amounts of foods that are soft and easy to digest (bland), such as toast. Gradually return to your regular diet.  Drink enough fluid to keep your urine pale yellow.  Return to your normal activities as told by your health care provider. Ask your health care provider what activities are safe for you. This information is not intended to replace advice given to you by your health care provider. Make sure you discuss any questions you have with your health care provider. Document Released: 12/17/2000 Document Revised: 04/26/2017 Document Reviewed: 04/26/2017 Elsevier Interactive Patient Education  2019 [employer].

## 2018-10-01 NOTE — Anesthesia Postprocedure Evaluation (Signed)
Anesthesia Post Note  Patient: Darlene Moore  Procedure(s) Performed: LEFT BREAST PARTIAL MASTECTOMY WITH SENTINEL LYMPH NODE BIOPSY AFTER NEEDLE LOCALIZATION AND RADIOTRACER PLACEMENT (Left Breast)  Patient location during evaluation: PACU Anesthesia Type: General Level of consciousness: awake and alert and patient cooperative Pain management: satisfactory to patient Vital Signs Assessment: post-procedure vital signs reviewed and stable Respiratory status: spontaneous breathing Cardiovascular status: stable Postop Assessment: no apparent nausea or vomiting Anesthetic complications: no     Last Vitals:  Vitals:   10/01/18 1330 10/01/18 1345  BP: (!) 154/69   Pulse: 63 63  Resp: 16 19  Temp:    SpO2: 97% 95%    Last Pain:  Vitals:   10/01/18 1345  PainSc: 3                  Alexsandro Salek

## 2018-10-01 NOTE — Op Note (Addendum)
[facility] Operative Note  10/01/18  Preoperative Diagnosis:  Left breast cancer    Postoperative Diagnosis: Same   Procedure(s) Performed:  Partial mastectomy with sentinel node biopsy after needle localization    Surgeon: Lanell Matar. Constance Haw, MD   Assistants: No qualified resident was available    Anesthesia: General endotracheal   Anesthesiologist: Dr. Currie Paris, MD    Specimens:  Partial mastectomy specimen, Sentinel Node, Representative margins Deep, Inferior, Superior, Medial, lateral, additional inferior, superior, medial, lateral margins    Estimated Blood Loss: Minimal   Blood Replacement: None    Complications: Initial Xray of the partial mastectomy specimen showing needle dislodged from specimen; additional deep margins taken with specimen seen on Xray but no biopsy marker not found in the specimen or additional specimens   Wound Class: Clean    Operative Indications: Darlene Moore is a 73 yo with a history of a newly diagnosed left breast cancer who presented after a normal screening mammogram. We discussed her options for surgery and she opted for partial mastectomy with sentinel node followed by radiation.  Her mass was not palpable and we discussed needle localization prior to her surgery.  The risk and benefits of surgery were discussed including but not limited to bleeding, infection, risk of need repeat surgery, need for radiation following surgery, and potential need for chemotherapy.  She opted to proceed.   Findings: Partial mastectomy specimen with dislodged needle, additional deep margin taken to the fascia of the pectoralis muscle, lesion seen on Xray but biopsy marker not found    Procedure: The patient was seen in the radiology department and underwent needle localization and radiotracer placement for her sentinel node biopsy. She was then taken to the operating room and placed supine. General endotracheal anesthesia was induced.  Intravenous antibiotics were  administered per protocol. A time out was performed, blue dye was injected around the areola  At the 12, 3, 6, and 9 o'clock positions subdermally in the standard fashion. This was massaged for 5 minutes.  The left breast and axilla were then prepped and draped in the standard fashion.  I reviewed the imaging prior to the procedure.   The gamma probe was then used to assess the point in the axilla to incision.  At the edge of the hairline the probe measured 200 prior to cutting the skin.  The skin was incised and carried down through the subcutaneous tissue.  In the bed of the incision, the probe measured 1500-2000, and a blue node was noted. This was excised in its entirety. Clips were used to ensure hemostasis and the lymph channel was clipped.  Ex vivo, the node continued to measure 2000s.  The bed following this excision measured 100 counts and less, which was less than 10% of the prior node.  No additional hot/ blue nodes were detected.  The bed was palpated and no additional nodes were felt.  The sentinel node was terminated and the specimen handed off the field.   Attention was then turned to the partial mastectomy, a circumareolar incision was made at the level of the needle. Care was taken to keep the needle intact in the specimen.  An Allis clamp was used to apply traction to the specimen. Flaps were made and the dissection was taken down circumferentially.  Clips were used in addition to electrocautery to maintain hemostasis. The needle localization was tangential in nature from lateral to medial, and care was taken to include the entire needle in the specimen.  The partial mastectomy specimen was completely excised and sent to radiology.  Additional margins were taken in the deep, superior, inferior, medial, and lateral positions. The deep margin was down to the fascia of the pectoralis muscle.  No superficial margin was taken as the flap was thinner in this region.     Radiology called and the specimen of the partial mastectomy did not have the lesion nor the biopsy marker as it looked as if the needle had been dislodged.  Immediately, the deep margin was sent to radiology.  Radiology confirmed that the deep margin housed the target lesion but no biopsy marker.  At this time the medial and superior margins were sent as these were the closest to where we presumed the end of the needle wound have been.  These did not have the biopsy marker either.   Additional medial, superior, inferior and lateral margins were taken.  No additional deep margin could be taken due to the location at the level of the pectoralis fascia.  I spoke with Dr. Thornton Papas the radiologist multiple times during this process. The marker was very small and could have been suctioned up versus fallen off the specimen.  We discussed potentially obtaining an Xray but the use of surgical clips limited our ability to differentiate between the marker and a clip used for hemostasis.  Ultimately, I opted to abort searching for the marker further as additional margins had been taken and sent separately.     The deep layer of the axilla was closed with 3-0 vicryl suture and the skin was closed with 4-0 Monocryl and dermabond.  The partial mastectomy site was closed with deep dermal 3-0 vicryl interrupted and the skin was closed with 4-0 Monocryl and dermabond.  The deep space was left open to allow for seroma formation for cosmesis.   Final inspection revealed acceptable hemostasis. All counts were correct at the end of the case. The patient was awakened from anesthesia and extubate without complication.  The patient went to the PACU in stable condition. I notified the patient and her son of the occurrence in the OR and the inability to find the biopsy marker as well as the dislodgement of the needle.  We discussed that we will have the final pathology to review and determine if additional procedures will need to be  performed.  They expressed understanding.    Curlene Labrum, MD [facility] [address] [place], [place] [address] [phone] (office)

## 2018-10-01 NOTE — Progress Notes (Signed)
To U/S via w/c for needle localization.

## 2018-10-01 NOTE — Interval H&P Note (Signed)
History and Physical Interval Note:  10/01/2018 9:55 AM  Darlene Moore  has presented today for surgery, with the diagnosis of left breast cancer  The various methods of treatment have been discussed with the patient and family. After consideration of risks, benefits and other options for treatment, the patient has consented to  Procedure(s): PARTIAL MASTECTOMY WITH NEEDLE LOCALIZATION AND AXILLARY SENTINEL LYMPH NODE BX (Left) as a surgical intervention .  The patient's history has been reviewed, patient examined, no change in status, stable for surgery.  I have reviewed the patient's chart and labs.  Questions were answered to the patient's satisfaction.    No questions. Spoke with son and patient. Needle localization and radiotracer performed without issues.   Virl Cagey

## 2018-10-01 NOTE — Anesthesia Procedure Notes (Signed)
Procedure Name: LMA Insertion Date/Time: 10/01/2018 10:14 AM Performed by: Ollen Bowl, CRNA Pre-anesthesia Checklist: Patient identified, Patient being monitored, Emergency Drugs available, Timeout performed and Suction available Patient Re-evaluated:Patient Re-evaluated prior to induction Oxygen Delivery Method: Circle System Utilized Preoxygenation: Pre-oxygenation with 100% oxygen Induction Type: IV induction Ventilation: Mask ventilation without difficulty LMA: LMA inserted LMA Size: 3.0 Number of attempts: 1 Placement Confirmation: positive ETCO2 and breath sounds checked- equal and bilateral

## 2018-10-02 ENCOUNTER — Encounter (HOSPITAL_COMMUNITY): Payer: Self-pay | Admitting: General Surgery

## 2018-10-06 ENCOUNTER — Telehealth: Payer: Self-pay | Admitting: General Surgery

## 2018-10-06 NOTE — Telephone Encounter (Signed)
[facility]  Called patient and son regarding pathology.  Diagnosis 1. Lymph node, sentinel, biopsy, left axilla - ONE OF ONE LYMPH NODES NEGATIVE FOR CARCINOMA (0/1). 2. Breast, lumpectomy, left breast - INVASIVE DUCTAL CARCINOMA, GRADE 1, SPANNING AT LEAST 1.6 CM. - INTERMEDIATE GRADE DUCTAL CARCINOMA IN SITU WITH NECROSIS. - INVASIVE CARCINOMA IS BROADLY PRESENT AT THE UNORIENTED INKED TISSUE EDGES. - IN SITU CARCINOMA IS FOCALLY PRESENT AT THE UNORIENTED INKED TISSUE EDGES. - SEE ONCOLOGY TABLE. 3. Breast, biopsy, deep left - INVASIVE DUCTAL CARCINOMA, GRADE 1. - INTERMEDIATE GRADE DUCTAL CARCINOMA IN SITU WITH NECROSIS. - INVASIVE CARCINOMA IS BROADLY PRESENT AT THE INKED UNORIENTED TISSUE EDGE. - IN SITU CARCINOMA IS FOCALLY PRESENT AT THE INKED UNORIENTED TISSUE EDGE. 4. Breast, biopsy, superior left - BENIGN BREAST TISSUE. 5. Breast, biopsy, medial left - BENIGN BREAST TISSUE. 6. Breast, biopsy, lateral left - BENIGN BREAST TISSUE. 7. Breast, biopsy, inferior left - BENIGN BREAST TISSUE. 8. Breast, biopsy, additional inferior left - BENIGN BREAST TISSUE. 9. Breast, biopsy, additional medial left - BENIGN BREAST TISSUE. 10. Breast, biopsy, additional superior left - BENIGN BREAST TISSUE. 11. Breast, biopsy, additional lateral left - BENIGN BREAST TISSUE.  I explained to the patient and son the lymph node was clear and the cancer was gone. I explained that the deep margin was at the pectoralis muscle and was positive. The deep breast tissue was all removed and it is not standard of care to remove any muscle that is not grossly involved. Discussed that I feel that the cancer was remove in its entirety but that the deep margin was positive.  She will need radiation therapy for the partial mastectomy and given this finding.  Discussed with Dr. Thornton Papas the radiologist the possibility of repeating a mammogram once she is healed to verify the biopsy marker is not  still present. He believes that the marker should appear different from the surgical clips.    Discussed with the patient and son that we will discuss things further in clinic.  Patient and son are on board with radiation.  Also on board with the mammogram.   Curlene Labrum, MD [facility] [address] [place], [place] [address] [phone] (office)

## 2018-10-09 NOTE — Progress Notes (Signed)
I reviewed note and agree with plan.   Penni Bombard, MD

## 2018-10-14 ENCOUNTER — Encounter: Payer: Self-pay | Admitting: General Surgery

## 2018-10-14 ENCOUNTER — Ambulatory Visit (INDEPENDENT_AMBULATORY_CARE_PROVIDER_SITE_OTHER): Payer: Self-pay | Admitting: General Surgery

## 2018-10-14 VITALS — BP 150/71 | HR 70 | Temp 97.5°F | Resp 18 | Wt 212.2 lb

## 2018-10-14 DIAGNOSIS — C50212 Malignant neoplasm of upper-inner quadrant of left female breast: Secondary | ICD-10-CM

## 2018-10-14 DIAGNOSIS — Z170 Estrogen receptor positive status [ER+]: Secondary | ICD-10-CM

## 2018-10-14 NOTE — Patient Instructions (Signed)
Plan for a Mammogram 4-6 weeks after surgery (first 2 weeks in February).  If this gets scheduled and you feel that it will be too pain still, call and get it rescheduled for later in February.  I will talk to Dr. Delton Coombes, and I will call you with the mammogram results.

## 2018-10-14 NOTE — Progress Notes (Signed)
[facility] Note   HPI:  73 y.o. Female presents to clinic for post-op follow-up evaluation after left partial mastectomy with sentinel node. Patient's wire moved during the surgery and additional margins were taken.  The deepest margin was positive but this was down to the pectoralis and I cleared the pectoralis of breat tissue in this region. This was explained to the patient and son at length that day and on a phone call after surgery when the pathology returned. Also discussed this today. Discussed that the biopsy marker not being in the specimen on imaging. Have discussed this with mammographers and will do a repeat mammogram on the left between 4-6 weeks to prove that it was suctioned up or fell off.   She says she is doing well and that her wounds are healing. She is having minimal pain.   Review of Systems:  Minimal pain No redness or drainage No fevers All other review of systems: otherwise negative   Pathology:  Diagnosis 1. Lymph node, sentinel, biopsy, left axilla - ONE OF ONE LYMPH NODES NEGATIVE FOR CARCINOMA (0/1). 2. Breast, lumpectomy, left breast - INVASIVE DUCTAL CARCINOMA, GRADE 1, SPANNING AT LEAST 1.6 CM. - INTERMEDIATE GRADE DUCTAL CARCINOMA IN SITU WITH NECROSIS. - INVASIVE CARCINOMA IS BROADLY PRESENT AT THE UNORIENTED INKED TISSUE EDGES. - IN SITU CARCINOMA IS FOCALLY PRESENT AT THE UNORIENTED INKED TISSUE EDGES. - SEE ONCOLOGY TABLE. 3. Breast, biopsy, deep left - INVASIVE DUCTAL CARCINOMA, GRADE 1. - INTERMEDIATE GRADE DUCTAL CARCINOMA IN SITU WITH NECROSIS. - INVASIVE CARCINOMA IS BROADLY PRESENT AT THE INKED UNORIENTED TISSUE EDGE. - IN SITU CARCINOMA IS FOCALLY PRESENT AT THE INKED UNORIENTED TISSUE EDGE. 4. Breast, biopsy, superior left - BENIGN BREAST TISSUE. 5. Breast, biopsy, medial left - BENIGN BREAST TISSUE. 6. Breast, biopsy, lateral left - BENIGN BREAST TISSUE. 7. Breast, biopsy, inferior left - BENIGN BREAST TISSUE. 8.  Breast, biopsy, additional inferior left - BENIGN BREAST TISSUE. 9. Breast, biopsy, additional medial left - BENIGN BREAST TISSUE. 10. Breast, biopsy, additional superior left - BENIGN BREAST TISSUE. 11. Breast, biopsy, additional lateral left - BENIGN BREAST TISSUE.  Vital Signs:  BP (!) 150/71 (BP Location: Left Arm, Patient Position: Sitting, Cuff Size: Large)   Pulse 70   Temp (!) 97.5 F (36.4 C) (Temporal)   Resp 18   Wt 212 lb 3.2 oz (96.3 kg)   BMI 35.31 kg/m    Physical Exam:  Physical Exam Cardiovascular:     Rate and Rhythm: Normal rate.  Pulmonary:     Effort: Pulmonary effort is normal.  Chest:     Comments: Left breast incision healing, dermabond still in place, no extending erythema or drainage, some induration Lymphadenopathy:     Comments: Left axilla with healing incision, no erythema or drainage  Neurological:     Mental Status: She is alert.    Assessment:  73 y.o. yo Female with a left breast cancer that is stage T1N0 who had a positive deep margin but this was down to the muscle, and biopsy marker that is not accounted for in the specimen.  We discussed that the radiation will take care of additional cancer cells, but that I would like to prove that the biopsy marker is not still in the breast.  We discussed that I feel that the breast cancer was removed as I cleared the muscle of all breast tissue inferiorly.   Plan:  - Mammogram of the left breast 4-6 weeks post op, as long as  patient can tolerate the procedure  - Will let Dr. Delton Coombes know about the biopsy marker and the findings on pathology  - Radiation to be set up through Oncology  - Follow up with me PRN, will call with mammogram results   Future Appointments  Date Time Provider [place]  10/28/2018 10:45 AM Derek Jack, MD [facility] None  11/06/2018 10:00 AM Clarene Essex, Counselor [facility] None  11/06/2018 11:10 AM [facility] LAB [facility] None  03/18/2019  1:30 PM  Penumalli, Earlean Polka, MD [facility] None    All of the above recommendations were discussed with the patient and patient's family, and all of patient's and family's questions were answered to their expressed satisfaction.  Curlene Labrum, MD [facility] [address] [place], [place] [address] [phone] (office)

## 2018-10-15 NOTE — Progress Notes (Signed)
erro  neous encounter

## 2018-10-17 NOTE — Addendum Note (Signed)
Addended by: Luanne Bras on: 10/17/2018 09:04 AM   Modules accepted: Orders

## 2018-10-23 ENCOUNTER — Other Ambulatory Visit: Payer: Self-pay

## 2018-10-23 ENCOUNTER — Emergency Department (HOSPITAL_COMMUNITY)
Admission: EM | Admit: 2018-10-23 | Discharge: 2018-10-23 | Disposition: A | Discharge status: Home / Self Care | Payer: Medicare HMO | Attending: Emergency Medicine | Admitting: Emergency Medicine

## 2018-10-23 ENCOUNTER — Other Ambulatory Visit (HOSPITAL_COMMUNITY): Payer: Self-pay | Admitting: Internal Medicine

## 2018-10-23 ENCOUNTER — Telehealth: Payer: Self-pay | Admitting: Diagnostic Neuroimaging

## 2018-10-23 ENCOUNTER — Emergency Department (HOSPITAL_COMMUNITY): Payer: Medicare HMO

## 2018-10-23 ENCOUNTER — Encounter (HOSPITAL_COMMUNITY): Payer: Self-pay | Admitting: Emergency Medicine

## 2018-10-23 DIAGNOSIS — Z853 Personal history of malignant neoplasm of breast: Secondary | ICD-10-CM

## 2018-10-23 DIAGNOSIS — R569 Unspecified convulsions: Secondary | ICD-10-CM | POA: Diagnosis not present

## 2018-10-23 DIAGNOSIS — R42 Dizziness and giddiness: Secondary | ICD-10-CM | POA: Diagnosis not present

## 2018-10-23 LAB — CBC
HCT: 44.3 % (ref 36.0–46.0)
Hemoglobin: 14 g/dL (ref 12.0–15.0)
MCH: 30.1 pg (ref 26.0–34.0)
MCHC: 31.6 g/dL (ref 30.0–36.0)
MCV: 95.3 fL (ref 80.0–100.0)
NRBC: 0 % (ref 0.0–0.2)
Platelets: 212 10*3/uL (ref 150–400)
RBC: 4.65 MIL/uL (ref 3.87–5.11)
RDW: 11.8 % (ref 11.5–15.5)
WBC: 6.1 10*3/uL (ref 4.0–10.5)

## 2018-10-23 LAB — BASIC METABOLIC PANEL
Anion gap: 13 (ref 5–15)
BUN: 19 mg/dL (ref 8–23)
CO2: 23 mmol/L (ref 22–32)
Calcium: 9.2 mg/dL (ref 8.9–10.3)
Chloride: 105 mmol/L (ref 98–111)
Creatinine, Ser: 0.59 mg/dL (ref 0.44–1.00)
GFR calc Af Amer: >60 mL/min (ref >60)
GFR calc non Af Amer: >60 mL/min (ref >60)
Glucose, Bld: 97 mg/dL (ref 70–99)
Potassium: 3.7 mmol/L (ref 3.5–5.1)
Sodium: 141 mmol/L (ref 135–145)

## 2018-10-23 LAB — URINALYSIS, ROUTINE W REFLEX MICROSCOPIC
Bilirubin Urine: NEGATIVE
Glucose, UA: NEGATIVE mg/dL
Hgb urine dipstick: NEGATIVE
Ketones, ur: NEGATIVE mg/dL
Leukocytes, UA: NEGATIVE
NITRITE: NEGATIVE
PH: 6 (ref 5.0–8.0)
Protein, ur: NEGATIVE mg/dL
Specific Gravity, Urine: 1.01 (ref 1.005–1.030)

## 2018-10-23 LAB — TROPONIN I: Troponin I: <0.03 ng/mL (ref <0.03)

## 2018-10-23 MED ORDER — MECLIZINE HCL 12.5 MG PO TABS
25.0000 mg | ORAL_TABLET | Freq: Once | ORAL | Status: AC
  Administered 2018-10-23: 25 mg via ORAL
  Filled 2018-10-23: qty 2

## 2018-10-23 MED ORDER — MECLIZINE HCL 25 MG PO TABS: 25.0000 mg | ORAL_TABLET | Freq: Three times a day (TID) | ORAL | 0 refills | Status: DC | PRN

## 2018-10-23 NOTE — Discharge Instructions (Addendum)
Procedures  You were evaluated in the Emergency Department and after careful evaluation, we did not find any emergent condition requiring admission or further testing in the hospital.  Your may be due to vertigo or may be lingering symptoms from a concussion.  You can try the medication provided for dizziness at home, we encourage you to also try the Epley maneuver, instructions attached.  We recommend further mental and physical rest.  Please return to the Emergency Department if you experience any worsening of your condition.  We encourage you to follow up with a primary care provider.  Thank you for allowing [place] to be a part of your care.

## 2018-10-23 NOTE — Telephone Encounter (Signed)
noted 

## 2018-10-23 NOTE — ED Provider Notes (Signed)
[facility] Emergency Department Provider Note MRN:  [id number]  Arrival date & time: 10/23/18     Chief Complaint   Dizziness   History of Present Illness   Darlene Moore is a 73 y.o. year-old female with a history of seizure disorder, subdural hematoma, stage I breast cancer presenting to the ED with chief complaint of dizziness.  Patient had a ground-level fall and head trauma 6 weeks ago, sustained a very small subdural hematoma.  Since that time has been experiencing persistent dizziness.  At times described as a lightheadedness sensation when she stands from a seated position.  Also describes a room spinning sensation when she lays flat in bed.  Seems to be related to head position at times.  Dizziness was more severe yesterday evening and today, especially with standing.  Denies numbness or weakness to the arms or legs, no recent fever, no headache, no vomiting, no chest pain, no shortness of breath, no abdominal pain, no dysuria.  Review of Systems  A complete 10 system review of systems was obtained and all systems are negative except as noted in the HPI and PMH.   Patient's Health History    Past Medical History:  Diagnosis Date  . High cholesterol   . Medical history non-contributory   . Seizures ([place])   . Seizures ([place])     Past Surgical History:  Procedure Laterality Date  . CHOLECYSTECTOMY    . COLONOSCOPY    . COLONOSCOPY N/A 09/29/2015   Procedure: COLONOSCOPY;  Surgeon: Rogene Houston, MD;  Location[facility];  Service: Endoscopy;  Laterality: N/A;  730 - moved to 9:00 - Ann to notify pt  . PARTIAL MASTECTOMY WITH NEEDLE LOCALIZATION AND AXILLARY SENTINEL LYMPH NODE BX Left 10/01/2018   Procedure: LEFT BREAST PARTIAL MASTECTOMY WITH SENTINEL LYMPH NODE BIOPSY AFTER NEEDLE LOCALIZATION AND RADIOTRACER PLACEMENT;  Surgeon: Virl Cagey, MD;  Location: [facility] ORS;  Service: General;  Laterality: Left;    Family History  Problem Relation Age  of Onset  . Breast cancer Sister   . Liver cancer Sister   . Diabetes Mother   . Heart disease Father   . Seizures Other        maternal    Social History   Socioeconomic History  . Marital status: Widowed    Spouse name: Not on file  . Number of children: Not on file  . Years of education: Not on file  . Highest education level: Not on file  Occupational History  . Not on file  Social Needs  . Financial resource strain: Not on file  . Food insecurity:    Worry: Not on file    Inability: Not on file  . Transportation needs:    Medical: Not on file    Non-medical: Not on file  Tobacco Use  . Smoking status: Never Smoker  . Smokeless tobacco: Never Used  Substance and Sexual Activity  . Alcohol use: No  . Drug use: No  . Sexual activity: Not on file  Lifestyle  . Physical activity:    Days per week: Not on file    Minutes per session: Not on file  . Stress: Not on file  Relationships  . Social connections:    Talks on phone: Not on file    Gets together: Not on file    Attends religious service: Not on file    Active member of club or organization: Not on file  Attends meetings of clubs or organizations: Not on file    Relationship status: Not on file  . Intimate partner violence:    Fear of current or ex partner: Not on file    Emotionally abused: Not on file    Physically abused: Not on file    Forced sexual activity: Not on file  Other Topics Concern  . Not on file  Social History Narrative   Lives home alone. Widow.   Retired/ not working.   Education 12th grade.  Children 1.  Drinks decaff coffee, tea.      Physical Exam  Vital Signs and Nursing Notes reviewed Vitals:   10/23/18 1530 10/23/18 1600  BP: (!) 163/76 (!) 168/75  Pulse: (!) 56 62  Resp: 15 15  Temp:    SpO2: 99% 99%    CONSTITUTIONAL: Well-appearing, NAD NEURO:  Alert and oriented x 3, no focal deficits, normal and symmetric strength and sensation EYES:  eyes equal and reactive, no  nystagmus, normal extraocular movements ENT/NECK:  no LAD, no JVD CARDIO: Regular rate, well-perfused, normal S1 and S2 PULM:  CTAB no wheezing or rhonchi GI/GU:  normal bowel sounds, non-distended, non-tender MSK/SPINE:  No gross deformities, no edema SKIN:  no rash, atraumatic PSYCH:  Appropriate speech and behavior  Diagnostic and Interventional Summary    EKG Interpretation  Date/Time:  Thursday October 23 2018 14:56:54 EST Ventricular Rate:  58 PR Interval:    QRS Duration: 87 QT Interval:  426 QTC Calculation: 419 R Axis:   48 Text Interpretation:  Sinus rhythm Borderline prolonged PR interval Low voltage, precordial leads Confirmed by Gerlene Fee [phone]) on 10/23/2018 3:52:39 PM      Labs Reviewed  URINALYSIS, ROUTINE W REFLEX MICROSCOPIC  CBC  BASIC METABOLIC PANEL  TROPONIN I    CT HEAD WO CONTRAST  Final Result      Medications  meclizine (ANTIVERT) tablet 25 mg (25 mg Oral Given 10/23/18 1506)     Procedures Critical Care  ED Course and Medical Decision Making  I have reviewed the triage vital signs and the nursing notes.  Pertinent labs & imaging results that were available during my care of the patient were reviewed by me and considered in my medical decision making (see below for details).  Question of worsening subdural hematoma, vertigo favored peripheral with no neurological deficits to suggest a central cause.  Also considering postconcussive syndrome.  Will need labs, EKG, CT head.  EKG unremarkable, CT head with complete resolution of subdural.  Labs reassuring.  Given information on the Epley maneuver, prescription for meclizine, advised mental and physical rest given the possibility of postconcussive syndrome.  No indication for further testing or admission at this time, again no neurological deficits, very positional, nothing to suggest central vertigo at this time.  After the discussed management above, the patient was determined to be safe  for discharge.  The patient was in agreement with this plan and all questions regarding their care were answered.  ED return precautions were discussed and the patient will return to the ED with any significant worsening of condition.  Barth Kirks. Sedonia Small, [place] [email or web address]  Final Clinical Impressions(s) / ED Diagnoses     ICD-10-CM   1. Dizziness R42     ED Discharge Orders         Ordered    meclizine (ANTIVERT) 25 MG tablet  3 times daily PRN     10/23/18 1738  Maudie Flakes, MD 10/23/18 [phone]

## 2018-10-23 NOTE — Telephone Encounter (Signed)
Pts son (on [place]) states that his mother is having an episode again having dizzyness and pain when she lays back. If she is standing straight up she is fine. He had taken her to the ER last time this happened and states they really did nothing for her.  Son would like pt to be seen today. Please advise.

## 2018-10-23 NOTE — Telephone Encounter (Signed)
Spoke to son, then pt.  She is c/o of dizziness, vertigo when she lays down and turns in bed.  She states she has pain in back of head when lying on it.   But if she is up she states she feels ok, although not totally.  She states that she has felt this way since she hit her head 6 wks, is having an episode that is worse now.  She is not nauseous.  She c/o of congestion, fullness, in R ear > L ear, decreased hearing in r ear.  She has not seen ENT.  She has been to her pcp and they have found nothing.  She has been on her keppra 1500mg  po bid.  She mentioned getting a scan.  I told her that she can go to ED, and be evaluated and if they think a CT is needed they can order and do it there.  She may go to urgent care as well, they do not do CT. She will speak to her to son, and then decide.   She has had not seizures, mainly dizziness/vertigo sx.  I relayed via VM for son as well.

## 2018-10-23 NOTE — ED Triage Notes (Signed)
On set 6 weeks ago seizure with head injury.Pt is having ongoing dizziness, worse today. Recent Breast surgery, has felt worse since then.

## 2018-10-24 ENCOUNTER — Telehealth: Payer: Self-pay | Admitting: Emergency Medicine

## 2018-10-24 NOTE — Telephone Encounter (Signed)
Called karen in scheduling and gave her ref# [id number] stating no PA is required for patients mammogram of left breast

## 2018-10-28 ENCOUNTER — Encounter (HOSPITAL_COMMUNITY): Payer: Self-pay | Admitting: Hematology

## 2018-10-28 ENCOUNTER — Inpatient Hospital Stay (HOSPITAL_COMMUNITY): Payer: Medicare HMO | Attending: Hematology | Admitting: Hematology

## 2018-10-28 ENCOUNTER — Other Ambulatory Visit: Payer: Self-pay

## 2018-10-28 VITALS — BP 117/68 | HR 57 | Temp 97.8°F | Resp 18 | Wt 209.4 lb

## 2018-10-28 DIAGNOSIS — G4089 Other seizures: Secondary | ICD-10-CM

## 2018-10-28 DIAGNOSIS — Z170 Estrogen receptor positive status [ER+]: Secondary | ICD-10-CM | POA: Diagnosis not present

## 2018-10-28 DIAGNOSIS — Z803 Family history of malignant neoplasm of breast: Secondary | ICD-10-CM | POA: Diagnosis not present

## 2018-10-28 DIAGNOSIS — M8589 Other specified disorders of bone density and structure, multiple sites: Secondary | ICD-10-CM | POA: Insufficient documentation

## 2018-10-28 DIAGNOSIS — Z79811 Long term (current) use of aromatase inhibitors: Secondary | ICD-10-CM | POA: Diagnosis not present

## 2018-10-28 DIAGNOSIS — C50212 Malignant neoplasm of upper-inner quadrant of left female breast: Secondary | ICD-10-CM

## 2018-10-28 MED ORDER — ANASTROZOLE 1 MG PO TABS: 1.0000 mg | ORAL_TABLET | Freq: Every day | ORAL | 3 refills | Status: DC

## 2018-10-28 NOTE — Progress Notes (Signed)
[place] [address], [place] [address]   CLINIC:  Medical Oncology/Hematology  PCP:  Celene Squibb, MD [place] [id number] [phone]   REASON FOR VISIT: Follow-up for left breast cancer.  CURRENT THERAPY: Arimidex daily    INTERVAL HISTORY:  Darlene Moore 73 y.o. female returns for routine follow-up for left breast cancer. She is here today with her son. She is has been recovering well since her surgery. She has had very little pain. Denies any nausea, vomiting, or diarrhea. Denies any new pains. Had not noticed any recent bleeding such as epistaxis, hematuria or hematochezia. Denies recent chest pain on exertion, shortness of breath on minimal exertion, pre-syncopal episodes, or palpitations. Denies any numbness or tingling in hands or feet. Denies any recent fevers, infections, or recent hospitalizations. Patient reports appetite at 100% and energy level at 100%.   REVIEW OF SYSTEMS:  Review of Systems  All other systems reviewed and are negative.    PAST MEDICAL/SURGICAL HISTORY:  Past Medical History:  Diagnosis Date  . High cholesterol   . Medical history non-contributory   . Seizures ([place])   . Seizures ([place])    Past Surgical History:  Procedure Laterality Date  . CHOLECYSTECTOMY    . COLONOSCOPY    . COLONOSCOPY N/A 09/29/2015   Procedure: COLONOSCOPY;  Surgeon: Rogene Houston, MD;  [facility];  Service: Endoscopy;  Laterality: N/A;  730 - moved to 9:00 - Ann to notify pt  . PARTIAL MASTECTOMY WITH NEEDLE LOCALIZATION AND AXILLARY SENTINEL LYMPH NODE BX Left 10/01/2018   Procedure: LEFT BREAST PARTIAL MASTECTOMY WITH SENTINEL LYMPH NODE BIOPSY AFTER NEEDLE LOCALIZATION AND RADIOTRACER PLACEMENT;  Surgeon: Virl Cagey, MD;  Location: [facility] ORS;  Service: General;  Laterality: Left;     SOCIAL HISTORY:  Social History   Socioeconomic History  . Marital status: Widowed    Spouse name: Not on file    . Number of children: Not on file  . Years of education: Not on file  . Highest education level: Not on file  Occupational History  . Not on file  Social Needs  . Financial resource strain: Not on file  . Food insecurity:    Worry: Not on file    Inability: Not on file  . Transportation needs:    Medical: Not on file    Non-medical: Not on file  Tobacco Use  . Smoking status: Never Smoker  . Smokeless tobacco: Never Used  Substance and Sexual Activity  . Alcohol use: No  . Drug use: No  . Sexual activity: Not on file  Lifestyle  . Physical activity:    Days per week: Not on file    Minutes per session: Not on file  . Stress: Not on file  Relationships  . Social connections:    Talks on phone: Not on file    Gets together: Not on file    Attends religious service: Not on file    Active member of club or organization: Not on file    Attends meetings of clubs or organizations: Not on file    Relationship status: Not on file  . Intimate partner violence:    Fear of current or ex partner: Not on file    Emotionally abused: Not on file    Physically abused: Not on file    Forced sexual activity: Not on file  Other Topics Concern  . Not on file  Social History Narrative   Lives home alone. Widow.   Retired/ not working.   Education 12th grade.  Children 1.  Drinks decaff coffee, tea.     FAMILY HISTORY:  Family History  Problem Relation Age of Onset  . Breast cancer Sister   . Liver cancer Sister   . Diabetes Mother   . Heart disease Father   . Seizures Other        maternal    CURRENT MEDICATIONS:  Outpatient Encounter Medications as of 10/28/2018  Medication Sig  . docusate sodium (COLACE) 100 MG capsule Take 1 capsule (100 mg total) by mouth 2 (two) times daily.  Marland Kitchen ibuprofen (ADVIL,MOTRIN) 200 MG tablet Take 400 mg by mouth every 8 (eight) hours as needed for mild pain or moderate pain.   Marland Kitchen levETIRAcetam (KEPPRA XR) 500 MG 24 hr tablet Take 3 tablets (1,500 mg  total) by mouth 2 (two) times daily.  Marland Kitchen loratadine (CLARITIN) 10 MG tablet Take 10 mg by mouth daily.  . meclizine (ANTIVERT) 25 MG tablet Take 1 tablet (25 mg total) by mouth 3 (three) times daily as needed for dizziness.  . Misc Natural Products (LEG VEIN & CIRCULATION PO) Take 1 tablet by mouth daily.  . Multiple Vitamin (MULTIVITAMIN) tablet Take 1 tablet by mouth daily.  . Probiotic Product (PROBIOTIC PO) Take 1 capsule by mouth daily.  Marland Kitchen anastrozole (ARIMIDEX) 1 MG tablet Take 1 tablet (1 mg total) by mouth daily.  . Cinnamon 500 MG TABS Take 1,000 mg by mouth daily.    No facility-administered encounter medications on file as of 10/28/2018.     ALLERGIES:  No Known Allergies   PHYSICAL EXAM:  ECOG Performance status: 1  Vitals:   10/28/18 1132  BP: 117/68  Pulse: (!) 57  Resp: 18  Temp: 97.8 F (36.6 C)  SpO2: 99%   Filed Weights   10/28/18 1132  Weight: 209 lb 6.4 oz (95 kg)    Physical Exam Constitutional:      Appearance: Normal appearance. She is normal weight.  Musculoskeletal: Normal range of motion.  Skin:    General: Skin is warm and dry.  Neurological:     Mental Status: She is alert and oriented to person, place, and time. Mental status is at baseline.  Psychiatric:        Mood and Affect: Mood normal.        Behavior: Behavior normal.        Thought Content: Thought content normal.        Judgment: Judgment normal.   Left breast lumpectomy site is well-healed.  No palpable adenopathy.   LABORATORY DATA:  I have reviewed the labs as listed.  CBC    Component Value Date/Time   WBC 6.1 10/23/2018 1510   RBC 4.65 10/23/2018 1510   HGB 14.0 10/23/2018 1510   HCT 44.3 10/23/2018 1510   PLT 212 10/23/2018 1510   MCV 95.3 10/23/2018 1510   MCH 30.1 10/23/2018 1510   MCHC 31.6 10/23/2018 1510   RDW 11.8 10/23/2018 1510   LYMPHSABS 1.5 09/13/2018 2139   MONOABS 0.7 09/13/2018 2139   EOSABS 0.2 09/13/2018 2139   BASOSABS 0.1 09/13/2018 2139    CMP Latest Ref Rng & Units 10/23/2018 09/13/2018 07/27/2017  Glucose 70 - 99 mg/dL 97 114(H) 94  BUN 8 - 23 mg/dL 19 26(H) 23(H)  Creatinine 0.44 - 1.00 mg/dL 0.59 0.73 0.88  Sodium 135 - 145 mmol/L 141 141 141  Potassium 3.5 -  5.1 mmol/L 3.7 4.2 4.1  Chloride 98 - 111 mmol/L 105 108 104  CO2 22 - 32 mmol/L '23 28 28  ' Calcium 8.9 - 10.3 mg/dL 9.2 9.3 9.6  Total Protein 6.5 - 8.1 g/dL - 7.1 -  Total Bilirubin 0.3 - 1.2 mg/dL - 0.4 -  Alkaline Phos 38 - 126 U/L - 88 -  AST 15 - 41 U/L - 21 -  ALT 0 - 44 U/L - 19 -       DIAGNOSTIC IMAGING:  I have independently reviewed the scans and discussed with the patient.   I have reviewed Darlene Finders, NP's note and agree with the documentation.  I personally performed a face-to-face visit, made revisions and my assessment and plan is as follows.    ASSESSMENT & PLAN:   Breast cancer of upper-inner quadrant of left female breast ([place]) 1.  Stage I (PT1cPN0) left breast IDC: - Screening mammogram on 08/07/2018 was BI-RADS Category 0.  - On 08/26/2018 she had left breast mammogram additional views, and ultrasound showing 0.9 x 0.9 x 0.7 cm 10:00 mass.  Ultrasound of the left axilla was negative. -Breast biopsy was done on 09/02/2018.  This showed IDC, grade 1, DCIS intermediate grade, ER/PR positive, HER-2 negative. -She underwent left breast lumpectomy and sentinel lymph node biopsy on 10/01/2018. -We had a prolonged discussion about her pathology report.  It shows 1.6 cm IDC, grade 1.  I have talked to Dr. Constance Haw about this case.  The deep margin was positive even though it was resected to the pectoralis muscle.  0/1 lymph nodes was involved.  ER was 100% positive, PR 20% and HER-2/neu negative.  Ki 67 was 2%. -Because of positive deep margin, I have recommended radiation therapy.  She was also scheduled for another mammogram as they could not trace the radioactive seed. -We will make an appointment to see Dr.Yanagihara. -I have recommended  antiestrogen therapy with anastrozole 1 mg daily for 5 years.  We talked about side effects in detail including but not limited to vasomotor symptoms, musculoskeletal symptoms and decreased bone mineral density. - We will see her back in 6 weeks to discuss the results of the bone density test.  2.  Osteopenia: -Last DEXA scan on 07/18/2015 shows T score of -1.7. -We will obtain a baseline DEXA scan. -I have also recommended her to take calcium and vitamin D twice daily.  3.  Seizure disorder: - She will continue Keppra 1500 p.o. twice daily.  4.  Family history:  -She has 2 sisters with metastatic breast cancer.  One sister died at age 41.  The other sister was diagnosed 4 years ago and is still living.  She had at least one maternal uncle and one paternal  aunt diagnosed with breast cancer. -She needs genetic testing.      Orders placed this encounter:  Orders Placed This Encounter  Procedures  . DG Bone Density  . CBC with Differential/Platelet  . Comprehensive metabolic panel  . VITAMIN D 25 Hydroxy (Vit-D Deficiency, Fractures)      Derek Jack, MD [place] [phone]

## 2018-10-28 NOTE — Patient Instructions (Signed)
[facility] at [facility] Discharge Instructions     Thank you for choosing [facility] at [facility] to provide your oncology and hematology care.  To afford each patient quality time with our provider, please arrive at least 15 minutes before your scheduled appointment time.   If you have a lab appointment with the Cancer Center please come in thru the  Main Entrance and check in at the main information desk  You need to re-schedule your appointment should you arrive 10 or more minutes late.  We strive to give you quality time with our providers, and arriving late affects you and other patients whose appointments are after yours.  Also, if you no show three or more times for appointments you may be dismissed from the clinic at the providers discretion.     Again, thank you for choosing [facility].  Our hope is that these requests will decrease the amount of time that you wait before being seen by our physicians.       _____________________________________________________________  Should you have questions after your visit to [facility], please contact our office at [phone] between the hours of 8:00 a.m. and 4:30 p.m.  Voicemails left after 4:00 p.m. will not be returned until the following business day.  For prescription refill requests, have your pharmacy contact our office and allow 72 hours.    Cancer Center Support Programs:   > Cancer Support Group  2nd Tuesday of the month 1pm-2pm, Journey Room    

## 2018-10-28 NOTE — Progress Notes (Signed)
Faxed referral and patient records to [facility] for [facility].  Transmission confirmation received.

## 2018-10-28 NOTE — Assessment & Plan Note (Signed)
1.  Stage I (PT1cPN0) left breast IDC: - Screening mammogram on 08/07/2018 was BI-RADS Category 0.  - On 08/26/2018 she had left breast mammogram additional views, and ultrasound showing 0.9 x 0.9 x 0.7 cm 10:00 mass.  Ultrasound of the left axilla was negative. -Breast biopsy was done on 09/02/2018.  This showed IDC, grade 1, DCIS intermediate grade, ER/PR positive, HER-2 negative. -She underwent left breast lumpectomy and sentinel lymph node biopsy on 10/01/2018. -We had a prolonged discussion about her pathology report.  It shows 1.6 cm IDC, grade 1.  I have talked to Dr. Constance Haw about this case.  The deep margin was positive even though it was resected to the pectoralis muscle.  0/1 lymph nodes was involved.  ER was 100% positive, PR 20% and HER-2/neu negative.  Ki 67 was 2%. -Because of positive deep margin, I have recommended radiation therapy.  She was also scheduled for another mammogram as they could not trace the radioactive seed. -We will make an appointment to see Dr.Yanagihara. -I have recommended antiestrogen therapy with anastrozole 1 mg daily for 5 years.  We talked about side effects in detail including but not limited to vasomotor symptoms, musculoskeletal symptoms and decreased bone mineral density. - We will see her back in 6 weeks to discuss the results of the bone density test.  2.  Osteopenia: -Last DEXA scan on 07/18/2015 shows T score of -1.7. -We will obtain a baseline DEXA scan. -I have also recommended her to take calcium and vitamin D twice daily.  3.  Seizure disorder: - She will continue Keppra 1500 p.o. twice daily.  4.  Family history:  -She has 2 sisters with metastatic breast cancer.  One sister died at age 24.  The other sister was diagnosed 4 years ago and is still living.  She had at least one maternal uncle and one paternal  aunt diagnosed with breast cancer. -She needs genetic testing.

## 2018-10-31 DIAGNOSIS — R51 Headache: Secondary | ICD-10-CM | POA: Diagnosis not present

## 2018-10-31 DIAGNOSIS — R42 Dizziness and giddiness: Secondary | ICD-10-CM | POA: Diagnosis not present

## 2018-10-31 DIAGNOSIS — H9193 Unspecified hearing loss, bilateral: Secondary | ICD-10-CM | POA: Diagnosis not present

## 2018-11-05 ENCOUNTER — Other Ambulatory Visit (HOSPITAL_COMMUNITY): Payer: Medicare HMO

## 2018-11-05 ENCOUNTER — Other Ambulatory Visit (HOSPITAL_COMMUNITY): Payer: Self-pay | Admitting: General Surgery

## 2018-11-06 ENCOUNTER — Ambulatory Visit (HOSPITAL_COMMUNITY)
Admission: RE | Admit: 2018-11-06 | Discharge: 2018-11-06 | Disposition: A | Discharge status: Home / Self Care | Payer: Medicare HMO | Source: Ambulatory Visit | Attending: Nurse Practitioner | Admitting: Nurse Practitioner

## 2018-11-06 ENCOUNTER — Inpatient Hospital Stay (HOSPITAL_BASED_OUTPATIENT_CLINIC_OR_DEPARTMENT_OTHER): Payer: Medicare HMO | Admitting: Genetic Counselor

## 2018-11-06 ENCOUNTER — Inpatient Hospital Stay (HOSPITAL_COMMUNITY): Payer: Medicare HMO

## 2018-11-06 ENCOUNTER — Encounter (HOSPITAL_COMMUNITY): Payer: Self-pay | Admitting: Genetic Counselor

## 2018-11-06 DIAGNOSIS — Z170 Estrogen receptor positive status [ER+]: Secondary | ICD-10-CM

## 2018-11-06 DIAGNOSIS — Z8041 Family history of malignant neoplasm of ovary: Secondary | ICD-10-CM | POA: Insufficient documentation

## 2018-11-06 DIAGNOSIS — C50212 Malignant neoplasm of upper-inner quadrant of left female breast: Secondary | ICD-10-CM | POA: Insufficient documentation

## 2018-11-06 DIAGNOSIS — Z803 Family history of malignant neoplasm of breast: Secondary | ICD-10-CM | POA: Diagnosis not present

## 2018-11-06 DIAGNOSIS — Z13820 Encounter for screening for osteoporosis: Secondary | ICD-10-CM | POA: Diagnosis not present

## 2018-11-06 DIAGNOSIS — Z8051 Family history of malignant neoplasm of kidney: Secondary | ICD-10-CM

## 2018-11-06 DIAGNOSIS — M8589 Other specified disorders of bone density and structure, multiple sites: Secondary | ICD-10-CM | POA: Diagnosis not present

## 2018-11-06 DIAGNOSIS — M85851 Other specified disorders of bone density and structure, right thigh: Secondary | ICD-10-CM | POA: Diagnosis not present

## 2018-11-06 DIAGNOSIS — Z800 Family history of malignant neoplasm of digestive organs: Secondary | ICD-10-CM | POA: Insufficient documentation

## 2018-11-06 DIAGNOSIS — M8588 Other specified disorders of bone density and structure, other site: Secondary | ICD-10-CM | POA: Diagnosis not present

## 2018-11-06 DIAGNOSIS — Z8049 Family history of malignant neoplasm of other genital organs: Secondary | ICD-10-CM | POA: Insufficient documentation

## 2018-11-06 NOTE — Progress Notes (Signed)
REFERRING PROVIDER: Derek Jack, MD [address] [place], [place] [address]  PRIMARY PROVIDER:  Celene Squibb, MD  PRIMARY REASON FOR VISIT:  1. Malignant neoplasm of upper-inner quadrant of left breast in female, estrogen receptor positive ([place])   2. Family history of breast cancer   3. Family history of ovarian cancer   4. Family history of uterine cancer   5. Family history of stomach cancer   6. Family history of kidney cancer      HISTORY OF PRESENT ILLNESS:   Darlene Moore, a 73 y.o. female, was seen for a [place] cancer genetics consultation at the request of Dr. Delton Coombes due to a personal and family history of cancer.  Darlene Moore presents to clinic today to discuss the possibility of a hereditary predisposition to cancer, genetic testing, and to further clarify her future cancer risks, as well as potential cancer risks for family members.   In 2019, at the age of 80, Darlene Moore was diagnosed with invasive ductal carcinoma of the left breast. This was treated with lumpectomy and tamoxifen.  She is scheduled for radiation.  Her sister, Darlene Moore, reportedly has undergone genetic testing.  She does not know the results.     CANCER HISTORY:   No history exists.     HORMONAL RISK FACTORS:  Menarche was at age 28-13.  First live birth at age 37.  OCP use for approximately 2-3\ years.  Ovaries intact: yes.  Hysterectomy: no.  Menopausal status: postmenopausal.  HRT use: 0 years. Colonoscopy: yes; normal. Mammogram within the last year: yes. Number of breast biopsies: 1. Up to date with pelvic exams:  yes. Any excessive radiation exposure in the past:  no  Past Medical History:  Diagnosis Date  . Family history of breast cancer   . Family history of kidney cancer   . Family history of ovarian cancer   . Family history of stomach cancer   . Family history of uterine cancer   . High cholesterol   . Medical history non-contributory   . Seizures ([place])   .  Seizures ([place])     Past Surgical History:  Procedure Laterality Date  . CHOLECYSTECTOMY    . COLONOSCOPY    . COLONOSCOPY N/A 09/29/2015   Procedure: COLONOSCOPY;  Surgeon: Rogene Houston, MD;  Location: [facility];  Service: Endoscopy;  Laterality: N/A;  730 - moved to 9:00 - Ann to notify pt  . PARTIAL MASTECTOMY WITH NEEDLE LOCALIZATION AND AXILLARY SENTINEL LYMPH NODE BX Left 10/01/2018   Procedure: LEFT BREAST PARTIAL MASTECTOMY WITH SENTINEL LYMPH NODE BIOPSY AFTER NEEDLE LOCALIZATION AND RADIOTRACER PLACEMENT;  Surgeon: Virl Cagey, MD;  Location: [facility] ORS;  Service: General;  Laterality: Left;    Social History   Socioeconomic History  . Marital status: Widowed    Spouse name: Not on file  . Number of children: Not on file  . Years of education: Not on file  . Highest education level: Not on file  Occupational History  . Not on file  Social Needs  . Financial resource strain: Not on file  . Food insecurity:    Worry: Not on file    Inability: Not on file  . Transportation needs:    Medical: Not on file    Non-medical: Not on file  Tobacco Use  . Smoking status: Never Smoker  . Smokeless tobacco: Never Used  Substance and Sexual Activity  . Alcohol use: No  . Drug use: No  .  Sexual activity: Not on file  Lifestyle  . Physical activity:    Days per week: Not on file    Minutes per session: Not on file  . Stress: Not on file  Relationships  . Social connections:    Talks on phone: Not on file    Gets together: Not on file    Attends religious service: Not on file    Active member of club or organization: Not on file    Attends meetings of clubs or organizations: Not on file    Relationship status: Not on file  Other Topics Concern  . Not on file  Social History Narrative   Lives home alone. Widow.   Retired/ not working.   Education 12th grade.  Children 1.  Drinks decaff coffee, tea.      FAMILY HISTORY:  We obtained a detailed, 4-generation family  history.  Significant diagnoses are listed below: Family History  Problem Relation Age of Onset  . Breast cancer Sister 79       d. 22  . Liver cancer Sister   . Breast cancer Sister 29       double mastectomy  . Diabetes Mother   . Heart disease Father        d. 51  . Seizures Other        maternal  . Kidney cancer Maternal Aunt   . Kidney cancer Maternal Uncle   . Brain cancer Paternal Aunt   . Uterine cancer Maternal Grandmother   . Leukemia Paternal Grandmother   . Ovarian cancer Niece 48  . Uterine cancer Niece 52  . Stomach cancer Niece 35  . Stomach cancer Maternal Uncle   . Kidney cancer Paternal Aunt   . Kidney cancer Paternal Aunt   . Bladder Cancer Paternal Aunt   . Skin cancer Paternal Aunt        on ankle; d. 45    The patient has one son who is cancer free.  She has one brother and three sisters.  One sister died at 64 from breast cancer, and a second sister has metastatic breast cancer.  This sister had genetic testing but we do not know the results.  She also has a daughter who had ovarian, uterine and stomach cancer.  The patient's mother is living at 46 and her father is deceased.  The patient's mother had 10 siblings.  One brother had stomach cancer, one brother had kidney cancer and a sister had kidney cancer.  The maternal grandparents are deceased.  The grandmother had uterine cancer  The patient's father died at 27 from heart disease.  He had 10 siblings.  One sister had a brain cancer, and two sisters had kidney and bladder cancer.  A fourth sister had a skin cancer on her ankle.  The paternal grandmother had leukemia and the grandfather died of non-cancer related issues.  Darlene Moore is aware of previous family history of genetic testing for hereditary cancer risks. Patient's maternal ancestors are of [place] descent, and paternal ancestors are of [place] descent. There is no reported Ashkenazi Jewish ancestry. There is no known consanguinity.  GENETIC  COUNSELING ASSESSMENT: Darlene Moore is a 73 y.o. female with a personal and family history of cancer which is somewhat suggestive of a hereditary cancer syndrome such as a BRCA mutation or possibly Lynch syndrome and predisposition to cancer. We, therefore, discussed and recommended the following at today's visit.   DISCUSSION: We discussed that about 5-10% of breast  cancer is hereditary with most cases due to BRCA mutations.  Other genes are also associated with hereditary breast cancer syndromes, as well as can be associated with other cancers in her family.  We reviewed the characteristics, features and inheritance patterns of hereditary cancer syndromes. We also discussed genetic testing, including the appropriate family members to test, the process of testing, insurance coverage and turn-around-time for results. We discussed the implications of a negative, positive and/or variant of uncertain significant result. We recommended Darlene Moore pursue genetic testing for the multi-cancer gene panel. The Multi-Gene Panel offered by [facility] includes sequencing and/or deletion duplication testing of the following 84 genes: AIP, ALK, APC, ATM, AXIN2,BAP1,  BARD1, BLM, BMPR1A, BRCA1, BRCA2, BRIP1, CASR, CDC73, CDH1, CDK4, CDKN1B, CDKN1C, CDKN2A (p14ARF), CDKN2A (p16INK4a), CEBPA, CHEK2, CTNNA1, DICER1, DIS3L2, EGFR (c.2369C>T, p.Thr790Met variant only), EPCAM (Deletion/duplication testing only), FH, FLCN, GATA2, GPC3, GREM1 (Promoter region deletion/duplication testing only), HOXB13 (c.251G>A, p.Gly84Glu), HRAS, KIT, MAX, MEN1, MET, MITF (c.952G>A, p.Glu318Lys variant only), MLH1, MSH2, MSH3, MSH6, MUTYH, NBN, NF1, NF2, NTHL1, PALB2, PDGFRA, PHOX2B, PMS2, POLD1, POLE, POT1, PRKAR1A, PTCH1, PTEN, RAD50, RAD51C, RAD51D, RB1, RECQL4, RET, RUNX1, SDHAF2, SDHA (sequence changes only), SDHB, SDHC, SDHD, SMAD4, SMARCA4, SMARCB1, SMARCE1, STK11, SUFU, TERC, TERT, TMEM127, TP53, TSC1, TSC2, VHL, WRN and WT1.    Based on  Darlene Moore personal and family history of cancer, she meets medical criteria for genetic testing. Despite that she meets criteria, she may still have an out of pocket cost. We discussed that if her out of pocket cost for testing is over $100, the laboratory will call and confirm whether she wants to proceed with testing.  If the out of pocket cost of testing is less than $100 she will be billed by the genetic testing laboratory.   PLAN: After considering the risks, benefits, and limitations, Darlene Moore  provided informed consent to pursue genetic testing and the blood sample was sent to Darlene Moore for analysis of the multi-cancer panel. Results should be available within approximately 2-3 weeks' time, at which point they will be disclosed by telephone to Darlene Moore, as will any additional recommendations warranted by these results. Darlene Moore will receive a summary of her genetic counseling visit and a copy of her results once available. This information will also be available in Epic. We encouraged Darlene Moore to remain in contact with cancer genetics annually so that we can continuously update the family history and inform her of any changes in cancer genetics and testing that may be of benefit for her family. Darlene Moore questions were answered to her satisfaction today. Our contact information was provided should additional questions or concerns arise.  Lastly, we encouraged Darlene. Minier to remain in contact with cancer genetics annually so that we can continuously update the family history and inform her of any changes in cancer genetics and testing that may be of benefit for this family.   Darlene.  Bacchi questions were answered to her satisfaction today. Our contact information was provided should additional questions or concerns arise. Thank you for the referral and allowing [place] to share in the care of your patient.   Adya Wirz P. Florene Glen, [place], [facility] Certified Genetic  Counselor [email or web address] phone: [phone]  The patient was seen for a total of 45 minutes in face-to-face genetic counseling.  This patient was discussed with Drs. Magrinat, Lindi Adie and/or Burr Medico who agrees with the above.    _______________________________________________________________________ For Office Staff:  Number of people involved in session: 1 Was an Intern/  student involved with case: no

## 2018-11-07 DIAGNOSIS — C50412 Malignant neoplasm of upper-outer quadrant of left female breast: Secondary | ICD-10-CM | POA: Diagnosis not present

## 2018-11-07 DIAGNOSIS — Z510 Encounter for antineoplastic radiation therapy: Secondary | ICD-10-CM | POA: Diagnosis not present

## 2018-11-07 DIAGNOSIS — Z170 Estrogen receptor positive status [ER+]: Secondary | ICD-10-CM | POA: Diagnosis not present

## 2018-11-10 DIAGNOSIS — Z510 Encounter for antineoplastic radiation therapy: Secondary | ICD-10-CM | POA: Diagnosis not present

## 2018-11-10 DIAGNOSIS — C50412 Malignant neoplasm of upper-outer quadrant of left female breast: Secondary | ICD-10-CM | POA: Diagnosis not present

## 2018-11-11 ENCOUNTER — Ambulatory Visit (HOSPITAL_COMMUNITY)
Admission: RE | Admit: 2018-11-11 | Discharge: 2018-11-11 | Disposition: A | Discharge status: Home / Self Care | Payer: Medicare HMO | Source: Ambulatory Visit | Attending: Internal Medicine | Admitting: Internal Medicine

## 2018-11-11 ENCOUNTER — Ambulatory Visit (HOSPITAL_COMMUNITY)
Admission: RE | Admit: 2018-11-11 | Discharge: 2018-11-11 | Disposition: A | Discharge status: Home / Self Care | Payer: Medicare HMO | Source: Ambulatory Visit | Attending: General Surgery | Admitting: General Surgery

## 2018-11-11 DIAGNOSIS — C50212 Malignant neoplasm of upper-inner quadrant of left female breast: Secondary | ICD-10-CM | POA: Diagnosis not present

## 2018-11-11 DIAGNOSIS — Z853 Personal history of malignant neoplasm of breast: Secondary | ICD-10-CM | POA: Insufficient documentation

## 2018-11-11 DIAGNOSIS — R928 Other abnormal and inconclusive findings on diagnostic imaging of breast: Secondary | ICD-10-CM | POA: Diagnosis not present

## 2018-11-11 DIAGNOSIS — Z170 Estrogen receptor positive status [ER+]: Secondary | ICD-10-CM | POA: Diagnosis not present

## 2018-11-11 DIAGNOSIS — N6489 Other specified disorders of breast: Secondary | ICD-10-CM | POA: Diagnosis not present

## 2018-11-12 ENCOUNTER — Telehealth: Payer: Self-pay | Admitting: General Surgery

## 2018-11-12 NOTE — Telephone Encounter (Signed)
[facility]   Mammogram/ [place]- post partial mastectomy  IMPRESSION: 1. No mammographic or sonographic evidence of residual malignancy in the LEFT breast. 2. Benign postoperative seroma at the lumpectomy site in the UPPER INNER LEFT breast.   No marker and no residual malignancy. The marker as we expected probably was suctioned out.   Have notified radiation oncology.  Updated Ms. Maffia.   Curlene Labrum, MD [facility] [address] [place], [place] [address] [phone] (office)

## 2018-11-20 ENCOUNTER — Encounter: Payer: Self-pay | Admitting: Genetic Counselor

## 2018-11-20 ENCOUNTER — Telehealth: Payer: Self-pay | Admitting: Genetic Counselor

## 2018-11-20 DIAGNOSIS — Z0000 Encounter for general adult medical examination without abnormal findings: Secondary | ICD-10-CM | POA: Insufficient documentation

## 2018-11-20 DIAGNOSIS — Z1379 Encounter for other screening for genetic and chromosomal anomalies: Secondary | ICD-10-CM | POA: Insufficient documentation

## 2018-11-20 NOTE — Telephone Encounter (Signed)
Revealed negative genetic testing.  Discussed that we do not know why she has breast cancer or why there is cancer in the family. It could be due to a different gene that we are not testing, or maybe our current technology may not be able to pick something up.  It will be important for her to keep in contact with genetics to keep up with whether additional testing may be needed. 

## 2018-11-21 ENCOUNTER — Ambulatory Visit: Payer: Self-pay | Admitting: Genetic Counselor

## 2018-11-21 DIAGNOSIS — Z1379 Encounter for other screening for genetic and chromosomal anomalies: Secondary | ICD-10-CM

## 2018-11-21 NOTE — Progress Notes (Signed)
HPI:  Ms. Gray was previously seen in the [place] clinic due to a personal and family history of cancer and concerns regarding a hereditary predisposition to cancer. Please refer to our prior cancer genetics clinic note for more information regarding Ms. Jeschke medical, social and family histories, and our assessment and recommendations, at the time. Ms. Waskey recent genetic test results were disclosed to her, as were recommendations warranted by these results. These results and recommendations are discussed in more detail below.  CANCER HISTORY:    Breast cancer of upper-inner quadrant of left female breast ([place])   09/10/2018 Initial Diagnosis    Breast cancer of upper-inner quadrant of left female breast ([place])    11/14/2018 Genetic Testing    Negative genetic testing on the multi-cancer panel.  The Multi-Gene Panel offered by [facility] includes sequencing and/or deletion duplication testing of the following 84 genes: AIP, ALK, APC, ATM, AXIN2,BAP1,  BARD1, BLM, BMPR1A, BRCA1, BRCA2, BRIP1, CASR, CDC73, CDH1, CDK4, CDKN1B, CDKN1C, CDKN2A (p14ARF), CDKN2A (p16INK4a), CEBPA, CHEK2, CTNNA1, DICER1, DIS3L2, EGFR (c.2369C>T, p.Thr790Met variant only), EPCAM (Deletion/duplication testing only), FH, FLCN, GATA2, GPC3, GREM1 (Promoter region deletion/duplication testing only), HOXB13 (c.251G>A, p.Gly84Glu), HRAS, KIT, MAX, MEN1, MET, MITF (c.952G>A, p.Glu318Lys variant only), MLH1, MSH2, MSH3, MSH6, MUTYH, NBN, NF1, NF2, NTHL1, PALB2, PDGFRA, PHOX2B, PMS2, POLD1, POLE, POT1, PRKAR1A, PTCH1, PTEN, RAD50, RAD51C, RAD51D, RB1, RECQL4, RET, RUNX1, SDHAF2, SDHA (sequence changes only), SDHB, SDHC, SDHD, SMAD4, SMARCA4, SMARCB1, SMARCE1, STK11, SUFU, TERC, TERT, TMEM127, TP53, TSC1, TSC2, VHL, WRN and WT1.  The report date is November 14, 2018.     FAMILY HISTORY:  We obtained a detailed, 4-generation family history.  Significant diagnoses are listed below: Family History  Problem  Relation Age of Onset  . Breast cancer Sister 62       d. 46  . Liver cancer Sister   . Breast cancer Sister 7       double mastectomy  . Diabetes Mother   . Heart disease Father        d. 42  . Seizures Other        maternal  . Kidney cancer Maternal Aunt   . Kidney cancer Maternal Uncle   . Brain cancer Paternal Aunt   . Uterine cancer Maternal Grandmother   . Leukemia Paternal Grandmother   . Ovarian cancer Niece 38  . Uterine cancer Niece 60  . Stomach cancer Niece 38  . Stomach cancer Maternal Uncle   . Kidney cancer Paternal Aunt   . Kidney cancer Paternal Aunt   . Bladder Cancer Paternal Aunt   . Skin cancer Paternal Aunt        on ankle; d. 56    The patient has one son who is cancer free.  She has one brother and three sisters.  One sister died at 13 from breast cancer, and a second sister has metastatic breast cancer.  This sister had genetic testing but we do not know the results.  She also has a daughter who had ovarian, uterine and stomach cancer.  The patient's mother is living at 51 and her father is deceased.  The patient's mother had 10 siblings.  One brother had stomach cancer, one brother had kidney cancer and a sister had kidney cancer.  The maternal grandparents are deceased.  The grandmother had uterine cancer  The patient's father died at 39 from heart disease.  He had 10 siblings.  One sister had a brain cancer, and two sisters had  kidney and bladder cancer.  A fourth sister had a skin cancer on her ankle.  The paternal grandmother had leukemia and the grandfather died of non-cancer related issues.  Ms. Sprung is aware of previous family history of genetic testing for hereditary cancer risks. Patient's maternal ancestors are of [place] descent, and paternal ancestors are of [place] descent. There is no reported Ashkenazi Jewish ancestry. There is no known consanguinity.  GENETIC TEST RESULTS: Genetic testing reported out on November 14, 2018 through the  multi-cancer panel found no deleterious mutations.  The Multi-Gene Panel offered by [facility] includes sequencing and/or deletion duplication testing of the following 84 genes: AIP, ALK, APC, ATM, AXIN2,BAP1,  BARD1, BLM, BMPR1A, BRCA1, BRCA2, BRIP1, CASR, CDC73, CDH1, CDK4, CDKN1B, CDKN1C, CDKN2A (p14ARF), CDKN2A (p16INK4a), CEBPA, CHEK2, CTNNA1, DICER1, DIS3L2, EGFR (c.2369C>T, p.Thr790Met variant only), EPCAM (Deletion/duplication testing only), FH, FLCN, GATA2, GPC3, GREM1 (Promoter region deletion/duplication testing only), HOXB13 (c.251G>A, p.Gly84Glu), HRAS, KIT, MAX, MEN1, MET, MITF (c.952G>A, p.Glu318Lys variant only), MLH1, MSH2, MSH3, MSH6, MUTYH, NBN, NF1, NF2, NTHL1, PALB2, PDGFRA, PHOX2B, PMS2, POLD1, POLE, POT1, PRKAR1A, PTCH1, PTEN, RAD50, RAD51C, RAD51D, RB1, RECQL4, RET, RUNX1, SDHAF2, SDHA (sequence changes only), SDHB, SDHC, SDHD, SMAD4, SMARCA4, SMARCB1, SMARCE1, STK11, SUFU, TERC, TERT, TMEM127, TP53, TSC1, TSC2, VHL, WRN and WT1.   The test report has been scanned into EPIC and is located under the Molecular Pathology section of the Results Review tab.    We discussed with Ms. Appelbaum that since the current genetic testing is not perfect, it is possible there may be a gene mutation in one of these genes that current testing cannot detect, but that chance is small.  We also discussed, that it is possible that another gene that has not yet been discovered, or that we have not yet tested, is responsible for the cancer diagnoses in the family, and it is, therefore, important to remain in touch with cancer genetics in the future so that we can continue to offer Ms. Tri the most up to date genetic testing.    CANCER SCREENING RECOMMENDATIONS: This result is reassuring and indicates that Ms. Huyett likely does not have an increased risk for a future cancer due to a mutation in one of these genes. This normal test also suggests that Ms. Gad cancer was most likely not due to an  inherited predisposition associated with one of these genes.  Most cancers happen by chance and this negative test suggests that her cancer falls into this category.  We, therefore, recommended she continue to follow the cancer management and screening guidelines provided by her oncology and primary healthcare provider.   An individual's cancer risk and medical management are not determined by genetic test results alone. Overall cancer risk assessment incorporates additional factors, including personal medical history, family history, and any available genetic information that may result in a personalized plan for cancer prevention and surveillance.  RECOMMENDATIONS FOR FAMILY MEMBERS:  Women in this family might be at some increased risk of developing cancer, over the general population risk, simply due to the family history of cancer.  We recommended women in this family have a yearly mammogram beginning at age 21, or 54 years younger than the earliest onset of cancer, an annual clinical breast exam, and perform monthly breast self-exams. Women in this family should also have a gynecological exam as recommended by their primary provider. All family members should have a colonoscopy by age 23.  FOLLOW-UP: Lastly, we discussed with Ms. Cermak that cancer genetics is a  rapidly advancing field and it is possible that new genetic tests will be appropriate for her and/or her family members in the future. We encouraged her to remain in contact with cancer genetics on an annual basis so we can update her personal and family histories and let her know of advances in cancer genetics that may benefit this family.   Our contact number was provided. Ms. Kurkowski questions were answered to her satisfaction, and she knows she is welcome to call us at anytime with additional questions or concerns.   Roma Kayser, MS, [facility] Certified Genetic Counselor [email or web address]

## 2018-12-05 DIAGNOSIS — C50912 Malignant neoplasm of unspecified site of left female breast: Secondary | ICD-10-CM | POA: Diagnosis not present

## 2018-12-05 DIAGNOSIS — C50412 Malignant neoplasm of upper-outer quadrant of left female breast: Secondary | ICD-10-CM | POA: Diagnosis not present

## 2018-12-08 ENCOUNTER — Ambulatory Visit (INDEPENDENT_AMBULATORY_CARE_PROVIDER_SITE_OTHER): Payer: Medicare HMO | Admitting: Otolaryngology

## 2018-12-08 DIAGNOSIS — H903 Sensorineural hearing loss, bilateral: Secondary | ICD-10-CM | POA: Diagnosis not present

## 2018-12-10 ENCOUNTER — Encounter (HOSPITAL_COMMUNITY): Payer: Self-pay | Admitting: Nurse Practitioner

## 2018-12-10 ENCOUNTER — Ambulatory Visit (HOSPITAL_COMMUNITY): Payer: Medicare HMO | Admitting: Nurse Practitioner

## 2018-12-10 ENCOUNTER — Inpatient Hospital Stay (HOSPITAL_COMMUNITY): Payer: Medicare HMO | Attending: Hematology

## 2018-12-10 ENCOUNTER — Other Ambulatory Visit: Payer: Self-pay

## 2018-12-10 ENCOUNTER — Other Ambulatory Visit (HOSPITAL_COMMUNITY): Payer: Medicare HMO

## 2018-12-10 ENCOUNTER — Inpatient Hospital Stay (HOSPITAL_COMMUNITY): Payer: Medicare HMO | Admitting: Nurse Practitioner

## 2018-12-10 DIAGNOSIS — Z170 Estrogen receptor positive status [ER+]: Secondary | ICD-10-CM

## 2018-12-10 DIAGNOSIS — Z79811 Long term (current) use of aromatase inhibitors: Secondary | ICD-10-CM

## 2018-12-10 DIAGNOSIS — M8589 Other specified disorders of bone density and structure, multiple sites: Secondary | ICD-10-CM

## 2018-12-10 DIAGNOSIS — G4089 Other seizures: Secondary | ICD-10-CM

## 2018-12-10 DIAGNOSIS — C50212 Malignant neoplasm of upper-inner quadrant of left female breast: Secondary | ICD-10-CM | POA: Insufficient documentation

## 2018-12-10 LAB — CBC WITH DIFFERENTIAL/PLATELET
Abs Immature Granulocytes: 0 10*3/uL (ref 0.00–0.07)
Basophils Absolute: 0 10*3/uL (ref 0.0–0.1)
Basophils Relative: 1 %
Eosinophils Absolute: 0 10*3/uL (ref 0.0–0.5)
Eosinophils Relative: 1 %
HCT: 42.7 % (ref 36.0–46.0)
Hemoglobin: 13.6 g/dL (ref 12.0–15.0)
Immature Granulocytes: 0 %
Lymphocytes Relative: 27 %
Lymphs Abs: 1.4 10*3/uL (ref 0.7–4.0)
MCH: 29.7 pg (ref 26.0–34.0)
MCHC: 31.9 g/dL (ref 30.0–36.0)
MCV: 93.2 fL (ref 80.0–100.0)
Monocytes Absolute: 0.4 10*3/uL (ref 0.1–1.0)
Monocytes Relative: 7 %
NEUTROS ABS: 3.4 10*3/uL (ref 1.7–7.7)
Neutrophils Relative %: 64 %
Platelets: 184 10*3/uL (ref 150–400)
RBC: 4.58 MIL/uL (ref 3.87–5.11)
RDW: 12 % (ref 11.5–15.5)
WBC: 5.3 10*3/uL (ref 4.0–10.5)
nRBC: 0 % (ref 0.0–0.2)

## 2018-12-10 LAB — COMPREHENSIVE METABOLIC PANEL
ALT: 17 U/L (ref 0–44)
AST: 23 U/L (ref 15–41)
Albumin: 4.1 g/dL (ref 3.5–5.0)
Alkaline Phosphatase: 93 U/L (ref 38–126)
Anion gap: 7 (ref 5–15)
BUN: 22 mg/dL (ref 8–23)
CO2: 28 mmol/L (ref 22–32)
Calcium: 9.4 mg/dL (ref 8.9–10.3)
Chloride: 105 mmol/L (ref 98–111)
Creatinine, Ser: 0.71 mg/dL (ref 0.44–1.00)
GFR calc non Af Amer: >60 mL/min (ref >60)
Glucose, Bld: 113 mg/dL — ABNORMAL HIGH (ref 70–99)
Potassium: 4.7 mmol/L (ref 3.5–5.1)
SODIUM: 140 mmol/L (ref 135–145)
Total Bilirubin: 0.5 mg/dL (ref 0.3–1.2)
Total Protein: 7.4 g/dL (ref 6.5–8.1)

## 2018-12-10 MED ORDER — ANASTROZOLE 1 MG PO TABS: 1.0000 mg | ORAL_TABLET | Freq: Every day | ORAL | 3 refills | Status: DC

## 2018-12-10 NOTE — Assessment & Plan Note (Addendum)
1. Stage I (PT1cPN0) left breast invasive ductal carcinoma (IDC): -Screening mammogram on 08/07/2018 once BI-RADS Category 0. - She had left breast mammogram on 08/26/2018 with additional views, and ultrasound showing 0.9 x 0.9 x 0.7 cm mass at 10 o'clock position.  She also had an ultrasound of the left axilla at that time which was negative. - Breast biopsy was done on 09/02/2018.  This showed IDC, grade 1, DCIS intermediate grade, ER/PR positive, her-2 negative. -She underwent left breast lumpectomy and sentinel lymph node biopsy on 10/01/2018. - Dr. Delton Coombes had a prolonged discussion about her pathology report.  It shows 1.6 cm IDC, grade 1.  The deep margins were positive even though it was resected to the pectoralis muscle.  0/1 lymph nodes was involved.  ER was 100% positive, PR 20% and HER-2/neu negative.  Ki 67 was 2%. - Because of the positive deep margins, Dr. Delton Coombes recommended radiation therapy.  She was also scheduled for another mammogram as they could not trace the radioactive seed. - She was referred to Dr. Francesca Jewett for radiation.  She reports she has not started yet due to being pushed out for more critical patients. -She was started on antiestrogen therapy with anastrozole 1 mg daily for 5 years. -She is tolerating the anastrozole well with no side effects at this time. -She is getting the recommended daily exercise. - We will have her follow-up in 3 months with repeat labs.  2.osteopenia: - DEXA scan on 07/18/2015 showed T score of -1.7. - DEXA scan on 11/06/2018 showed a T score of -2.1.  Results are reviewed with the patient on 12/10/2018. - She stopped her calcium due to constipation.  She was reeducated on the importance of it and that she may add a stool softener with that. - She was educated on Prolia to prevent osteoporosis.  However she wanted to start taking her calcium twice a day and wait on that treatment. -We will repeat a DEXA scan in 2 years.  3.seizure  disorder: -She will continue Keppra 1500 mg p.o. twice daily.

## 2018-12-10 NOTE — Patient Instructions (Signed)
[facility] at [facility] Discharge Instructions  Follow up in 3 months with labs    Thank you for choosing [facility] at [facility] to provide your oncology and hematology care.  To afford each patient quality time with our provider, please arrive at least 15 minutes before your scheduled appointment time.   If you have a lab appointment with the Cancer Center please come in thru the  Main Entrance and check in at the main information desk  You need to re-schedule your appointment should you arrive 10 or more minutes late.  We strive to give you quality time with our providers, and arriving late affects you and other patients whose appointments are after yours.  Also, if you no show three or more times for appointments you may be dismissed from the clinic at the providers discretion.     Again, thank you for choosing [facility].  Our hope is that these requests will decrease the amount of time that you wait before being seen by our physicians.       _____________________________________________________________  Should you have questions after your visit to [facility], please contact our office at [phone] between the hours of 8:00 a.m. and 4:30 p.m.  Voicemails left after 4:00 p.m. will not be returned until the following business day.  For prescription refill requests, have your pharmacy contact our office and allow 72 hours.    Cancer Center Support Programs:   > Cancer Support Group  2nd Tuesday of the month 1pm-2pm, Journey Room    

## 2018-12-10 NOTE — Progress Notes (Signed)
[address] [place], [place] [address]   CLINIC:  Medical Oncology/Hematology  PCP:  Celene Squibb, MD [place] [address] [phone]   REASON FOR VISIT: Follow-up for left breast cancer, ER+/PR+/HER2-  CURRENT THERAPY: Arimidex daily  BRIEF ONCOLOGIC HISTORY:    Breast cancer of upper-inner quadrant of left female breast ([place])   09/10/2018 Initial Diagnosis    Breast cancer of upper-inner quadrant of left female breast ([place])    11/14/2018 Genetic Testing    Negative genetic testing on the multi-cancer panel.  The Multi-Gene Panel offered by [facility] includes sequencing and/or deletion duplication testing of the following 84 genes: AIP, ALK, APC, ATM, AXIN2,BAP1,  BARD1, BLM, BMPR1A, BRCA1, BRCA2, BRIP1, CASR, CDC73, CDH1, CDK4, CDKN1B, CDKN1C, CDKN2A (p14ARF), CDKN2A (p16INK4a), CEBPA, CHEK2, CTNNA1, DICER1, DIS3L2, EGFR (c.2369C>T, p.Thr790Met variant only), EPCAM (Deletion/duplication testing only), FH, FLCN, GATA2, GPC3, GREM1 (Promoter region deletion/duplication testing only), HOXB13 (c.251G>A, p.Gly84Glu), HRAS, KIT, MAX, MEN1, MET, MITF (c.952G>A, p.Glu318Lys variant only), MLH1, MSH2, MSH3, MSH6, MUTYH, NBN, NF1, NF2, NTHL1, PALB2, PDGFRA, PHOX2B, PMS2, POLD1, POLE, POT1, PRKAR1A, PTCH1, PTEN, RAD50, RAD51C, RAD51D, RB1, RECQL4, RET, RUNX1, SDHAF2, SDHA (sequence changes only), SDHB, SDHC, SDHD, SMAD4, SMARCA4, SMARCB1, SMARCE1, STK11, SUFU, TERC, TERT, TMEM127, TP53, TSC1, TSC2, VHL, WRN and WT1.  The report date is November 14, 2018.      INTERVAL HISTORY:  Ms. Bodin 73 y.o. female returns for routine follow-up for left breast cancer.  She is here today with her son.  She is tolerating her Arimidex well.  She has no side effects from the medication that she has noticed.  She reports she has not started radiation yet.  Due to getting pushed back for other critical patients.  They will call her with her start date within the  month.  She reports she stopped taking her calcium due to constipation.  She was reeducated on the importance of calcium due to her being diagnosed with osteopenia.  She will start back taking the calcium with a stool softener. Denies any nausea, vomiting, or diarrhea. Denies any new pains. Had not noticed any recent bleeding such as epistaxis, hematuria or hematochezia. Denies recent chest pain on exertion, shortness of breath on minimal exertion, pre-syncopal episodes, or palpitations. Denies any numbness or tingling in hands or feet. Denies any recent fevers, infections, or recent hospitalizations. Patient reports appetite at 100% and energy level at 100%.  She remains active during the day cleaning out and reorganizing rooms in her house she also bought elliptical and is going to try to use it.  Her appetite is good and she is eating well.    REVIEW OF SYSTEMS:  Review of Systems  Gastrointestinal: Positive for constipation.  All other systems reviewed and are negative.    PAST MEDICAL/SURGICAL HISTORY:  Past Medical History:  Diagnosis Date  . Family history of breast cancer   . Family history of kidney cancer   . Family history of ovarian cancer   . Family history of stomach cancer   . Family history of uterine cancer   . High cholesterol   . Medical history non-contributory   . Seizures ([place])   . Seizures ([place])    Past Surgical History:  Procedure Laterality Date  . CHOLECYSTECTOMY    . COLONOSCOPY    . COLONOSCOPY N/A 09/29/2015   Procedure: COLONOSCOPY;  Surgeon: Rogene Houston, MD;  Location: [facility] ENDO SUITE;  Service: Endoscopy;  Laterality: N/A;  730 - moved to 9:00 - Ann to notify pt  . PARTIAL MASTECTOMY WITH NEEDLE LOCALIZATION AND AXILLARY SENTINEL LYMPH NODE BX Left 10/01/2018   Procedure: LEFT BREAST PARTIAL MASTECTOMY WITH SENTINEL LYMPH NODE BIOPSY AFTER NEEDLE LOCALIZATION AND RADIOTRACER PLACEMENT;  Surgeon: Virl Cagey, MD;  Location: [facility] ORS;  Service: General;   Laterality: Left;     SOCIAL HISTORY:  Social History   Socioeconomic History  . Marital status: Widowed    Spouse name: Not on file  . Number of children: Not on file  . Years of education: Not on file  . Highest education level: Not on file  Occupational History  . Not on file  Social Needs  . Financial resource strain: Not on file  . Food insecurity:    Worry: Not on file    Inability: Not on file  . Transportation needs:    Medical: Not on file    Non-medical: Not on file  Tobacco Use  . Smoking status: Never Smoker  . Smokeless tobacco: Never Used  Substance and Sexual Activity  . Alcohol use: No  . Drug use: No  . Sexual activity: Not on file  Lifestyle  . Physical activity:    Days per week: Not on file    Minutes per session: Not on file  . Stress: Not on file  Relationships  . Social connections:    Talks on phone: Not on file    Gets together: Not on file    Attends religious service: Not on file    Active member of club or organization: Not on file    Attends meetings of clubs or organizations: Not on file    Relationship status: Not on file  . Intimate partner violence:    Fear of current or ex partner: Not on file    Emotionally abused: Not on file    Physically abused: Not on file    Forced sexual activity: Not on file  Other Topics Concern  . Not on file  Social History Narrative   Lives home alone. Widow.   Retired/ not working.   Education 12th grade.  Children 1.  Drinks decaff coffee, tea.     FAMILY HISTORY:  Family History  Problem Relation Age of Onset  . Breast cancer Sister 45       d. 44  . Liver cancer Sister   . Breast cancer Sister 24       double mastectomy  . Diabetes Mother   . Heart disease Father        d. 37  . Seizures Other        maternal  . Kidney cancer Maternal Aunt   . Kidney cancer Maternal Uncle   . Brain cancer Paternal Aunt   . Uterine cancer Maternal Grandmother   . Leukemia Paternal Grandmother   .  Ovarian cancer Niece 78  . Uterine cancer Niece 44  . Stomach cancer Niece 66  . Stomach cancer Maternal Uncle   . Kidney cancer Paternal Aunt   . Kidney cancer Paternal Aunt   . Bladder Cancer Paternal Aunt   . Skin cancer Paternal Aunt        on ankle; d. 99    CURRENT MEDICATIONS:  Outpatient Encounter Medications as of 12/10/2018  Medication Sig  . anastrozole (ARIMIDEX) 1 MG tablet Take 1 tablet (1 mg total) by mouth daily.  . Calcium Carbonate (CALCIUM 600 PO) Take by mouth 2 (two) times daily.  Marland Kitchen  docusate sodium (COLACE) 100 MG capsule Take 1 capsule (100 mg total) by mouth 2 (two) times daily.  Marland Kitchen ibuprofen (ADVIL,MOTRIN) 200 MG tablet Take 400 mg by mouth every 8 (eight) hours as needed for mild pain or moderate pain.   Marland Kitchen levETIRAcetam (KEPPRA XR) 500 MG 24 hr tablet Take 3 tablets (1,500 mg total) by mouth 2 (two) times daily.  . Multiple Vitamin (MULTIVITAMIN) tablet Take 1 tablet by mouth daily.  Marland Kitchen VITAMIN D PO Take 1,000 Units by mouth.  . [DISCONTINUED] anastrozole (ARIMIDEX) 1 MG tablet Take 1 tablet (1 mg total) by mouth daily.  . Cinnamon 500 MG TABS Take 1,000 mg by mouth daily.   Marland Kitchen loratadine (CLARITIN) 10 MG tablet Take 10 mg by mouth daily.  . meclizine (ANTIVERT) 25 MG tablet Take 1 tablet (25 mg total) by mouth 3 (three) times daily as needed for dizziness. (Patient not taking: Reported on 12/10/2018)  . Misc Natural Products (LEG VEIN & CIRCULATION PO) Take 1 tablet by mouth daily.  . Probiotic Product (PROBIOTIC PO) Take 1 capsule by mouth daily.   No facility-administered encounter medications on file as of 12/10/2018.     ALLERGIES:  No Known Allergies   PHYSICAL EXAM:  ECOG Performance status: 1  Vitals:   12/10/18 1335  BP: 101/78  Pulse: 62  Resp: 16  Temp: 97.8 F (36.6 C)  SpO2: 100%   Filed Weights   12/10/18 1335  Weight: 212 lb (96.2 kg)    Physical Exam Constitutional:      Appearance: Normal appearance. She is normal weight.   Cardiovascular:     Rate and Rhythm: Normal rate and regular rhythm.     Heart sounds: Normal heart sounds.  Pulmonary:     Effort: Pulmonary effort is normal.     Breath sounds: Normal breath sounds.  Musculoskeletal: Normal range of motion.  Skin:    General: Skin is warm and dry.  Neurological:     Mental Status: She is alert and oriented to person, place, and time. Mental status is at baseline.  Psychiatric:        Mood and Affect: Mood normal.        Behavior: Behavior normal.        Thought Content: Thought content normal.        Judgment: Judgment normal.   Breast: Incision site of lumpectomy well-healed.  No redness or swelling. No palpable masses, no skin changes or nipple discharge, no adenopathy.    LABORATORY DATA:  I have reviewed the labs as listed.  CBC    Component Value Date/Time   WBC 5.3 12/10/2018 1214   RBC 4.58 12/10/2018 1214   HGB 13.6 12/10/2018 1214   HCT 42.7 12/10/2018 1214   PLT 184 12/10/2018 1214   MCV 93.2 12/10/2018 1214   MCH 29.7 12/10/2018 1214   MCHC 31.9 12/10/2018 1214   RDW 12.0 12/10/2018 1214   LYMPHSABS 1.4 12/10/2018 1214   MONOABS 0.4 12/10/2018 1214   EOSABS 0.0 12/10/2018 1214   BASOSABS 0.0 12/10/2018 1214   CMP Latest Ref Rng & Units 12/10/2018 10/23/2018 09/13/2018  Glucose 70 - 99 mg/dL 113(H) 97 114(H)  BUN 8 - 23 mg/dL 22 19 26(H)  Creatinine 0.44 - 1.00 mg/dL 0.71 0.59 0.73  Sodium 135 - 145 mmol/L 140 141 141  Potassium 3.5 - 5.1 mmol/L 4.7 3.7 4.2  Chloride 98 - 111 mmol/L 105 105 108  CO2 22 - 32 mmol/L 28 23 28  Calcium 8.9 - 10.3 mg/dL 9.4 9.2 9.3  Total Protein 6.5 - 8.1 g/dL 7.4 - 7.1  Total Bilirubin 0.3 - 1.2 mg/dL 0.5 - 0.4  Alkaline Phos 38 - 126 U/L 93 - 88  AST 15 - 41 U/L 23 - 21  ALT 0 - 44 U/L 17 - 19       DIAGNOSTIC IMAGING:  I have independently reviewed the Dexa scans and discussed with the patient.    I personally performed a face-to-face visit, made revisions and my assessment  and plan is as follows.    ASSESSMENT & PLAN:   Breast cancer of upper-inner quadrant of left female breast ([place]) 1. Stage I (PT1cPN0) left breast invasive ductal carcinoma (IDC): -Screening mammogram on 08/07/2018 once BI-RADS Category 0. - She had left breast mammogram on 08/26/2018 with additional views, and ultrasound showing 0.9 x 0.9 x 0.7 cm mass at 10 o'clock position.  She also had an ultrasound of the left axilla at that time which was negative. - Breast biopsy was done on 09/02/2018.  This showed IDC, grade 1, DCIS intermediate grade, ER/PR positive, her-2 negative. -She underwent left breast lumpectomy and sentinel lymph node biopsy on 10/01/2018. - Dr. Delton Coombes had a prolonged discussion about her pathology report.  It shows 1.6 cm IDC, grade 1.  The deep margins were positive even though it was resected to the pectoralis muscle.  0/1 lymph nodes was involved.  ER was 100% positive, PR 20% and HER-2/neu negative.  Ki 67 was 2%. - Because of the positive deep margins, Dr. Delton Coombes recommended radiation therapy.  She was also scheduled for another mammogram as they could not trace the radioactive seed. - She was referred to Dr. Francesca Jewett for radiation.  She reports she has not started yet due to being pushed out for more critical patients. -She was started on antiestrogen therapy with anastrozole 1 mg daily for 5 years. -She is tolerating the anastrozole well with no side effects at this time. -She is getting the recommended daily exercise. - We will have her follow-up in 3 months with repeat labs.  2.osteopenia: - DEXA scan on 07/18/2015 showed T score of -1.7. - DEXA scan on 11/06/2018 showed a T score of -2.1.  Results are reviewed with the patient on 12/10/2018. - She stopped her calcium due to constipation.  She was reeducated on the importance of it and that she may add a stool softener with that. - She was educated on Prolia to prevent osteoporosis.  However she wanted to  start taking her calcium twice a day and wait on that treatment. -We will repeat a DEXA scan in 2 years.  3.seizure disorder: -She will continue Keppra 1500 mg p.o. twice daily.        Orders placed this encounter:  Orders Placed This Encounter  Procedures  . VITAMIN D 25 Hydroxy (Vit-D Deficiency, Fractures)  . CBC with Differential/Platelet  . Comprehensive metabolic panel      Francene Finders NP [place] [phone]

## 2018-12-11 LAB — VITAMIN D 25 HYDROXY (VIT D DEFICIENCY, FRACTURES): VIT D 25 HYDROXY: 30.1 ng/mL (ref 30.0–100.0)

## 2018-12-29 DIAGNOSIS — C50412 Malignant neoplasm of upper-outer quadrant of left female breast: Secondary | ICD-10-CM | POA: Diagnosis not present

## 2018-12-30 DIAGNOSIS — C50412 Malignant neoplasm of upper-outer quadrant of left female breast: Secondary | ICD-10-CM | POA: Diagnosis not present

## 2018-12-31 DIAGNOSIS — C50412 Malignant neoplasm of upper-outer quadrant of left female breast: Secondary | ICD-10-CM | POA: Diagnosis not present

## 2019-01-01 DIAGNOSIS — C50412 Malignant neoplasm of upper-outer quadrant of left female breast: Secondary | ICD-10-CM | POA: Diagnosis not present

## 2019-01-05 DIAGNOSIS — C50412 Malignant neoplasm of upper-outer quadrant of left female breast: Secondary | ICD-10-CM | POA: Diagnosis not present

## 2019-01-06 DIAGNOSIS — C50412 Malignant neoplasm of upper-outer quadrant of left female breast: Secondary | ICD-10-CM | POA: Diagnosis not present

## 2019-01-08 DIAGNOSIS — C50412 Malignant neoplasm of upper-outer quadrant of left female breast: Secondary | ICD-10-CM | POA: Diagnosis not present

## 2019-01-09 DIAGNOSIS — C50412 Malignant neoplasm of upper-outer quadrant of left female breast: Secondary | ICD-10-CM | POA: Diagnosis not present

## 2019-01-12 DIAGNOSIS — C50412 Malignant neoplasm of upper-outer quadrant of left female breast: Secondary | ICD-10-CM | POA: Diagnosis not present

## 2019-01-13 DIAGNOSIS — C50412 Malignant neoplasm of upper-outer quadrant of left female breast: Secondary | ICD-10-CM | POA: Diagnosis not present

## 2019-01-14 DIAGNOSIS — C50412 Malignant neoplasm of upper-outer quadrant of left female breast: Secondary | ICD-10-CM | POA: Diagnosis not present

## 2019-01-15 DIAGNOSIS — C50412 Malignant neoplasm of upper-outer quadrant of left female breast: Secondary | ICD-10-CM | POA: Diagnosis not present

## 2019-01-16 DIAGNOSIS — C50412 Malignant neoplasm of upper-outer quadrant of left female breast: Secondary | ICD-10-CM | POA: Diagnosis not present

## 2019-01-19 DIAGNOSIS — C50412 Malignant neoplasm of upper-outer quadrant of left female breast: Secondary | ICD-10-CM | POA: Diagnosis not present

## 2019-01-20 DIAGNOSIS — C50412 Malignant neoplasm of upper-outer quadrant of left female breast: Secondary | ICD-10-CM | POA: Diagnosis not present

## 2019-01-21 DIAGNOSIS — C50412 Malignant neoplasm of upper-outer quadrant of left female breast: Secondary | ICD-10-CM | POA: Diagnosis not present

## 2019-01-22 DIAGNOSIS — Z0000 Encounter for general adult medical examination without abnormal findings: Secondary | ICD-10-CM | POA: Diagnosis not present

## 2019-01-22 DIAGNOSIS — C50412 Malignant neoplasm of upper-outer quadrant of left female breast: Secondary | ICD-10-CM | POA: Diagnosis not present

## 2019-01-22 DIAGNOSIS — Z510 Encounter for antineoplastic radiation therapy: Secondary | ICD-10-CM | POA: Diagnosis not present

## 2019-01-23 DIAGNOSIS — Z510 Encounter for antineoplastic radiation therapy: Secondary | ICD-10-CM | POA: Diagnosis not present

## 2019-01-23 DIAGNOSIS — C50412 Malignant neoplasm of upper-outer quadrant of left female breast: Secondary | ICD-10-CM | POA: Diagnosis not present

## 2019-01-26 DIAGNOSIS — C50412 Malignant neoplasm of upper-outer quadrant of left female breast: Secondary | ICD-10-CM | POA: Diagnosis not present

## 2019-01-26 DIAGNOSIS — Z510 Encounter for antineoplastic radiation therapy: Secondary | ICD-10-CM | POA: Diagnosis not present

## 2019-01-27 DIAGNOSIS — C50412 Malignant neoplasm of upper-outer quadrant of left female breast: Secondary | ICD-10-CM | POA: Diagnosis not present

## 2019-01-27 DIAGNOSIS — Z510 Encounter for antineoplastic radiation therapy: Secondary | ICD-10-CM | POA: Diagnosis not present

## 2019-01-28 DIAGNOSIS — Z510 Encounter for antineoplastic radiation therapy: Secondary | ICD-10-CM | POA: Diagnosis not present

## 2019-01-28 DIAGNOSIS — C50412 Malignant neoplasm of upper-outer quadrant of left female breast: Secondary | ICD-10-CM | POA: Diagnosis not present

## 2019-01-29 DIAGNOSIS — Z510 Encounter for antineoplastic radiation therapy: Secondary | ICD-10-CM | POA: Diagnosis not present

## 2019-01-29 DIAGNOSIS — C50412 Malignant neoplasm of upper-outer quadrant of left female breast: Secondary | ICD-10-CM | POA: Diagnosis not present

## 2019-01-30 DIAGNOSIS — Z510 Encounter for antineoplastic radiation therapy: Secondary | ICD-10-CM | POA: Diagnosis not present

## 2019-01-30 DIAGNOSIS — C50412 Malignant neoplasm of upper-outer quadrant of left female breast: Secondary | ICD-10-CM | POA: Diagnosis not present

## 2019-02-02 ENCOUNTER — Ambulatory Visit: Payer: Medicare HMO | Admitting: Diagnostic Neuroimaging

## 2019-02-05 ENCOUNTER — Telehealth: Payer: Self-pay | Admitting: *Deleted

## 2019-02-05 ENCOUNTER — Encounter: Payer: Self-pay | Admitting: *Deleted

## 2019-02-05 NOTE — Telephone Encounter (Signed)
Spoke with patient and advised her that due to current COVID 19 pandemic, our office is severely reducing in person visits in order to minimize the risk to our patients and healthcare providers. We recommend to convert your appointment to a video visit.  She stated she was unable to do video visit but consented to telephone visit. Updated EMR and moved her FU sooner. She  verbalized understanding, appreciation.

## 2019-02-09 ENCOUNTER — Other Ambulatory Visit: Payer: Self-pay

## 2019-02-09 ENCOUNTER — Ambulatory Visit (INDEPENDENT_AMBULATORY_CARE_PROVIDER_SITE_OTHER): Payer: Medicare HMO | Admitting: Diagnostic Neuroimaging

## 2019-02-09 DIAGNOSIS — G40109 Localization-related (focal) (partial) symptomatic epilepsy and epileptic syndromes with simple partial seizures, not intractable, without status epilepticus: Secondary | ICD-10-CM
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
## 2019-02-20 ENCOUNTER — Other Ambulatory Visit (HOSPITAL_COMMUNITY): Payer: Self-pay | Admitting: Nurse Practitioner

## 2019-02-20 DIAGNOSIS — C50212 Malignant neoplasm of upper-inner quadrant of left female breast: Secondary | ICD-10-CM

## 2019-02-25 DIAGNOSIS — H903 Sensorineural hearing loss, bilateral: Secondary | ICD-10-CM | POA: Diagnosis not present

## 2019-02-25 DIAGNOSIS — H9113 Presbycusis, bilateral: Secondary | ICD-10-CM | POA: Diagnosis not present

## 2019-03-02 ENCOUNTER — Encounter (HOSPITAL_COMMUNITY): Payer: Self-pay | Admitting: Nurse Practitioner

## 2019-03-02 ENCOUNTER — Other Ambulatory Visit: Payer: Medicare HMO

## 2019-03-02 ENCOUNTER — Other Ambulatory Visit: Payer: Self-pay

## 2019-03-02 DIAGNOSIS — R6889 Other general symptoms and signs: Secondary | ICD-10-CM | POA: Diagnosis not present

## 2019-03-02 DIAGNOSIS — Z20822 Contact with and (suspected) exposure to covid-19: Secondary | ICD-10-CM

## 2019-03-05 ENCOUNTER — Other Ambulatory Visit (HOSPITAL_COMMUNITY): Payer: Medicare HMO

## 2019-03-05 LAB — NOVEL CORONAVIRUS, NAA: SARS-CoV-2, NAA: NOT DETECTED

## 2019-03-08 ENCOUNTER — Encounter (HOSPITAL_COMMUNITY): Payer: Self-pay | Admitting: Nurse Practitioner

## 2019-03-12 ENCOUNTER — Other Ambulatory Visit (HOSPITAL_COMMUNITY): Payer: Medicare HMO

## 2019-03-12 ENCOUNTER — Ambulatory Visit (HOSPITAL_COMMUNITY): Payer: Medicare HMO | Admitting: Nurse Practitioner

## 2019-03-13 IMAGING — CT CT HEAD W/O CM
3 series · 16 of 47 positions shown, 19 images · non-contrast
Comparison: 09/13/2018

CLINICAL DATA: Recent seizure activity

EXAM:
CT HEAD WITHOUT CONTRAST
TECHNIQUE: Contiguous axial images were obtained from the base of the skull
through the vertex without intravenous contrast.

[Series 2: head trauma wo · axial · 0.41mm/px · z∈[+49,+174]mm · 10 of 31 slices shown, 13 images]
[im 3/31  brain]
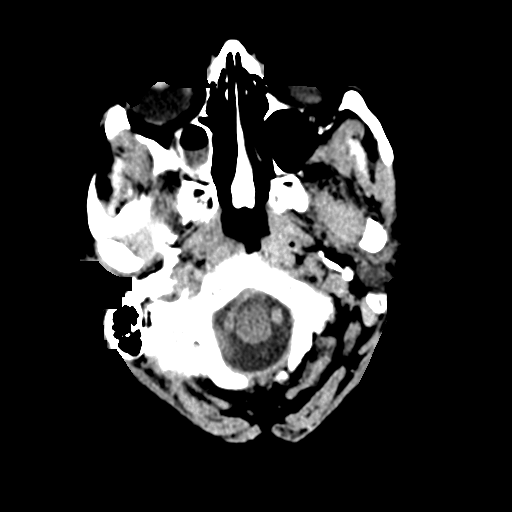
[im 3/31  bone]
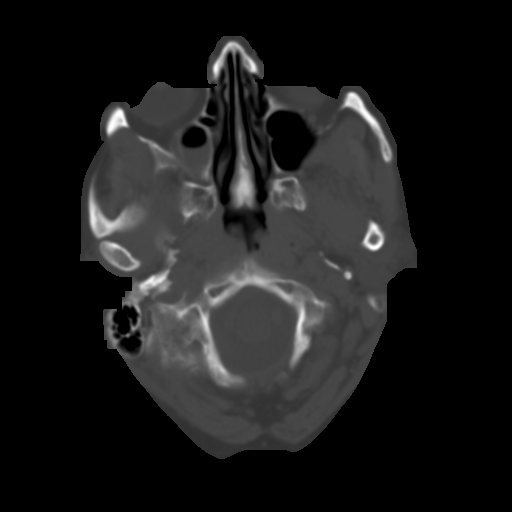
[im 6/31  brain]
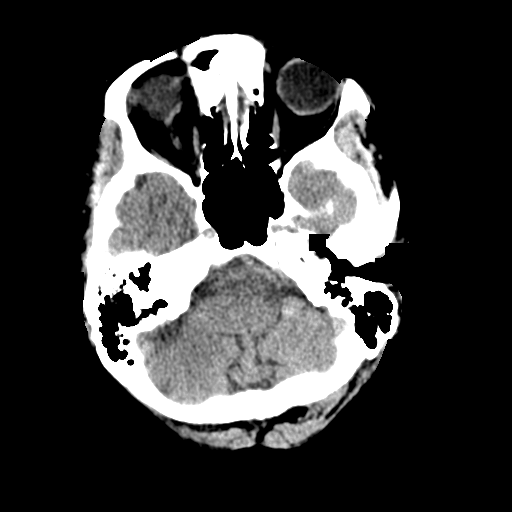
[im 9/31  brain]
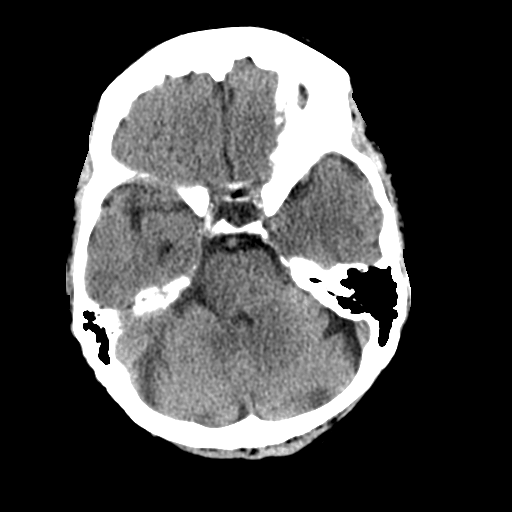
[im 11/31  brain]
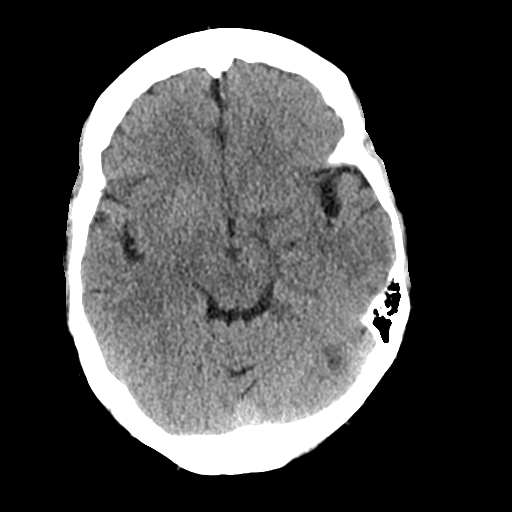
[im 14/31  brain]
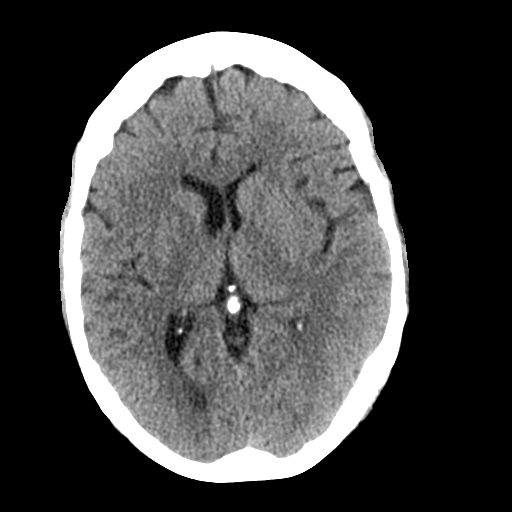
[im 14/31  bone]
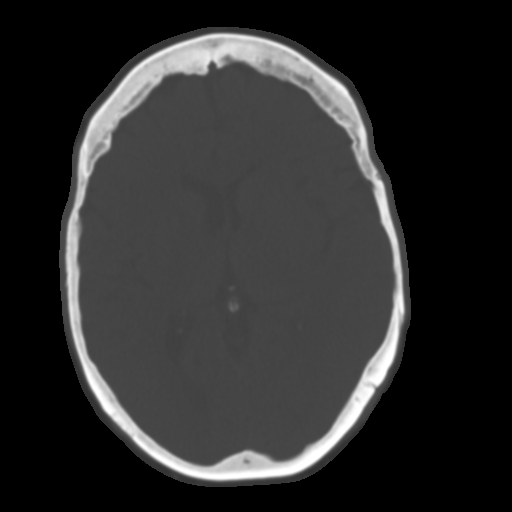
[im 17/31  brain]
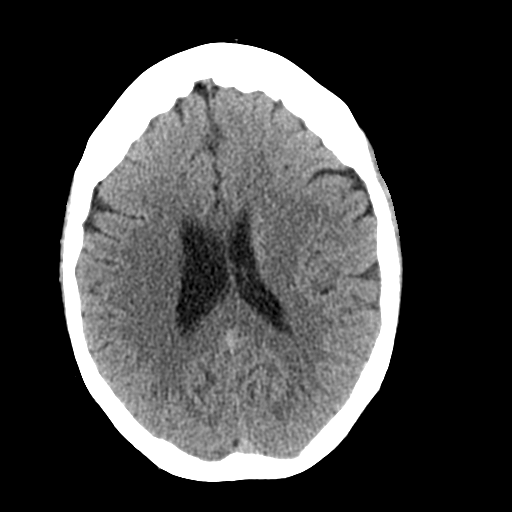
[im 20/31  brain]
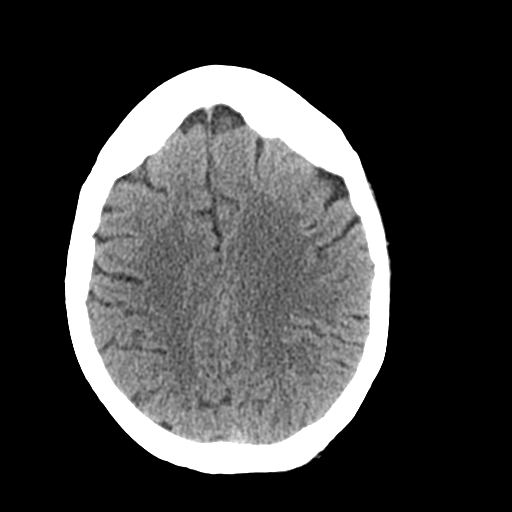
[im 23/31  brain]
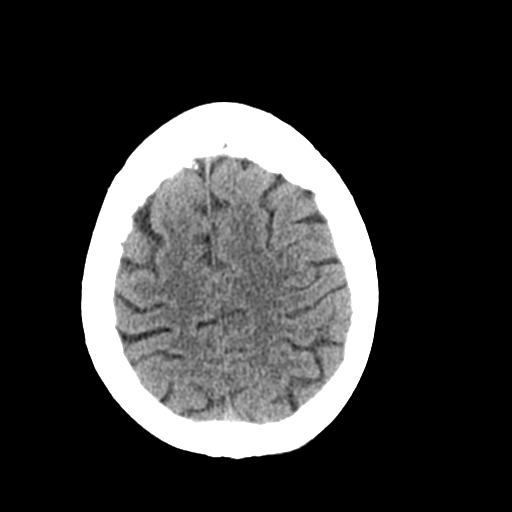
[im 25/31  brain]
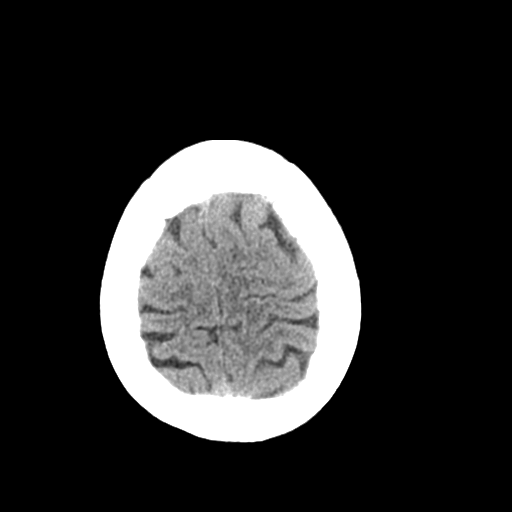
[im 25/31  bone]
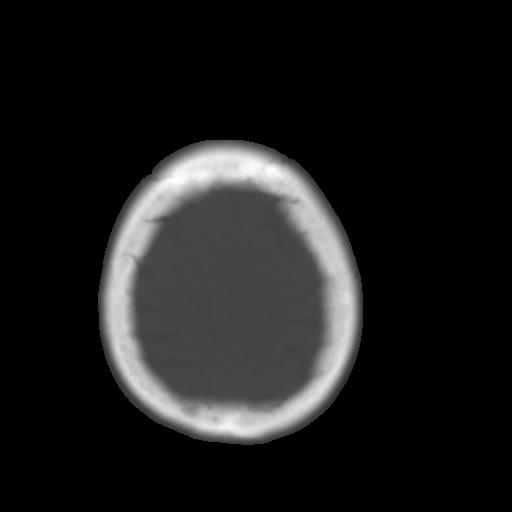
[im 28/31  brain]
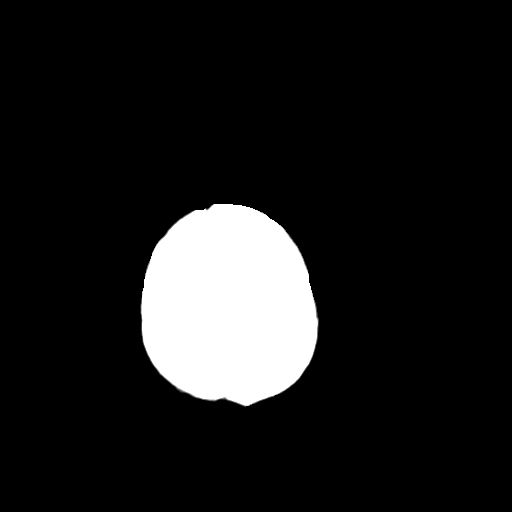

[Series 4: coronal soft tissue · coronal · 0.32mm/px · 3 of 65 slices shown]
[im 22/65  brain]
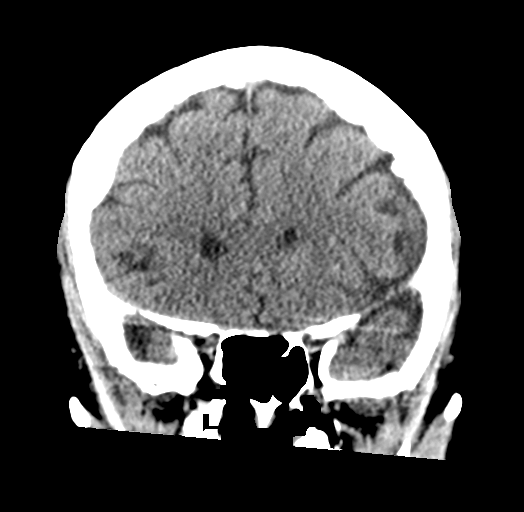
[im 29/65  brain]
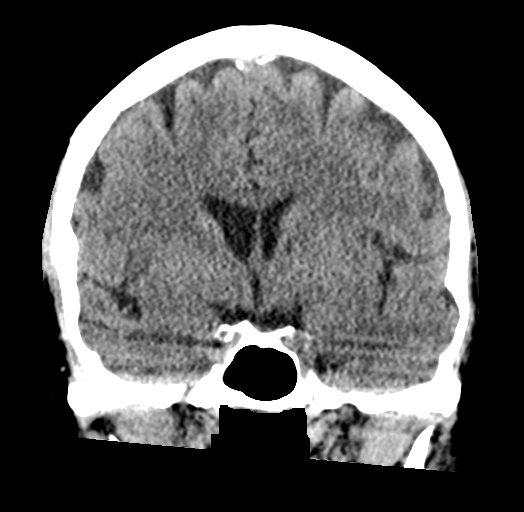
[im 36/65  brain]
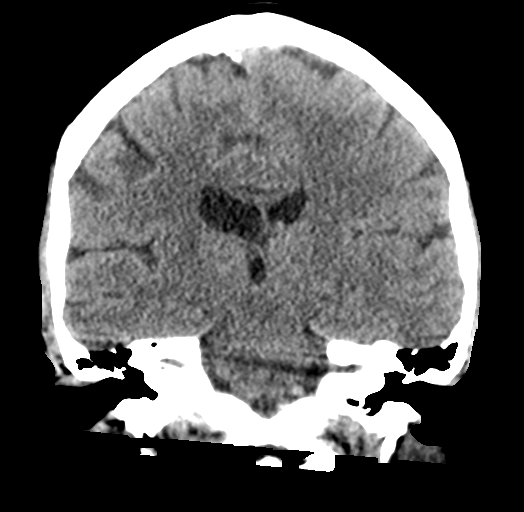

[Series 5: sagittal soft tissue · sagittal · 0.32mm/px · 3 of 57 slices shown]
[im 19/57  brain]
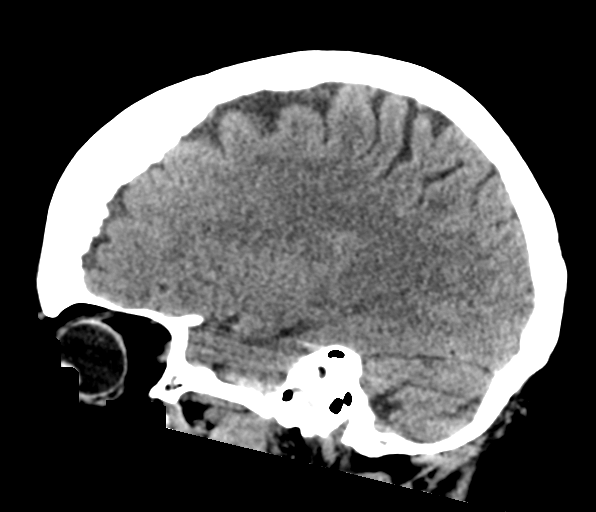
[im 29/57  brain]
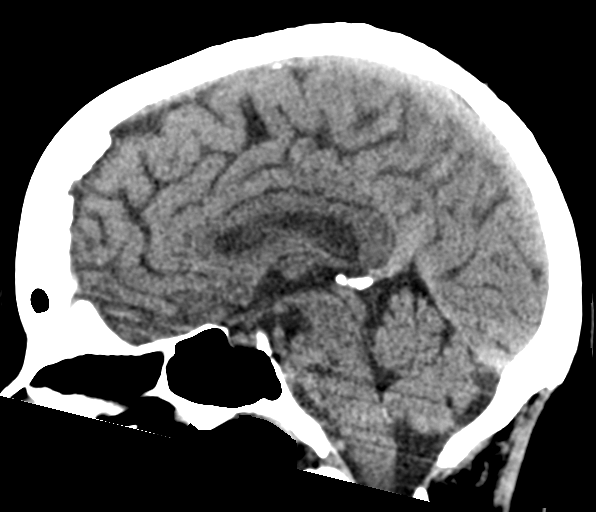
[im 38/57  brain]
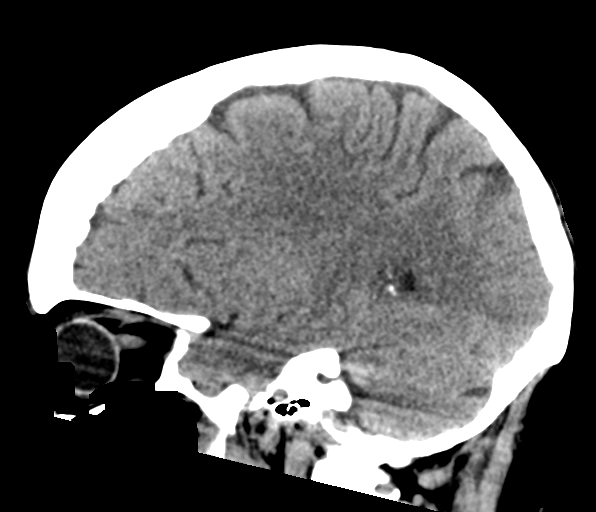

[16 of 47 positions shown; findings below may reference images not displayed]

FINDINGS: Brain: Mild atrophic changes are again seen and stable. Previously
noted falx subdural is no longer identified. No new hemorrhage or
infarct seen. No mass lesion is noted.

Vascular: No hyperdense vessel or unexpected calcification.

Skull: Normal. Negative for fracture or focal lesion.

Sinuses/Orbits: Paranasal sinuses demonstrates air-fluid level on
the right within the maxillary antrum.

Other: None.
IMPRESSION: No acute abnormality is identified intracranially. Previously seen
subdural hematoma has resolved.

Air-fluid level in the right maxillary antrum consistent with acute
sinusitis.

## 2019-03-18 ENCOUNTER — Ambulatory Visit: Payer: Medicare HMO | Admitting: Diagnostic Neuroimaging

## 2019-03-24 DIAGNOSIS — C50412 Malignant neoplasm of upper-outer quadrant of left female breast: Secondary | ICD-10-CM | POA: Diagnosis not present

## 2019-03-24 DIAGNOSIS — Z809 Family history of malignant neoplasm, unspecified: Secondary | ICD-10-CM | POA: Diagnosis not present

## 2019-03-24 DIAGNOSIS — Z170 Estrogen receptor positive status [ER+]: Secondary | ICD-10-CM | POA: Diagnosis not present

## 2019-03-24 DIAGNOSIS — Z9049 Acquired absence of other specified parts of digestive tract: Secondary | ICD-10-CM | POA: Diagnosis not present

## 2019-03-24 DIAGNOSIS — Z8669 Personal history of other diseases of the nervous system and sense organs: Secondary | ICD-10-CM | POA: Diagnosis not present

## 2019-03-24 DIAGNOSIS — R5383 Other fatigue: Secondary | ICD-10-CM | POA: Diagnosis not present

## 2019-03-31 ENCOUNTER — Other Ambulatory Visit: Payer: Self-pay

## 2019-03-31 ENCOUNTER — Inpatient Hospital Stay (HOSPITAL_COMMUNITY): Payer: Medicare HMO | Attending: Hematology

## 2019-03-31 DIAGNOSIS — C50212 Malignant neoplasm of upper-inner quadrant of left female breast: Secondary | ICD-10-CM | POA: Diagnosis not present

## 2019-03-31 DIAGNOSIS — Z79811 Long term (current) use of aromatase inhibitors: Secondary | ICD-10-CM | POA: Diagnosis not present

## 2019-03-31 DIAGNOSIS — G4089 Other seizures: Secondary | ICD-10-CM | POA: Insufficient documentation

## 2019-03-31 DIAGNOSIS — M8580 Other specified disorders of bone density and structure, unspecified site: Secondary | ICD-10-CM | POA: Diagnosis not present

## 2019-03-31 DIAGNOSIS — Z170 Estrogen receptor positive status [ER+]: Secondary | ICD-10-CM | POA: Diagnosis not present

## 2019-03-31 LAB — CBC WITH DIFFERENTIAL/PLATELET
Abs Immature Granulocytes: 0.02 10*3/uL (ref 0.00–0.07)
Basophils Absolute: 0.1 10*3/uL (ref 0.0–0.1)
Basophils Relative: 1 %
Eosinophils Absolute: 0.1 10*3/uL (ref 0.0–0.5)
Eosinophils Relative: 1 %
HCT: 41.5 % (ref 36.0–46.0)
Hemoglobin: 13.1 g/dL (ref 12.0–15.0)
Immature Granulocytes: 0 %
Lymphocytes Relative: 17 %
Lymphs Abs: 0.8 10*3/uL (ref 0.7–4.0)
MCH: 30 pg (ref 26.0–34.0)
MCHC: 31.6 g/dL (ref 30.0–36.0)
MCV: 95 fL (ref 80.0–100.0)
Monocytes Absolute: 0.4 10*3/uL (ref 0.1–1.0)
Monocytes Relative: 9 %
Neutro Abs: 3.4 10*3/uL (ref 1.7–7.7)
Neutrophils Relative %: 72 %
Platelets: 145 10*3/uL — ABNORMAL LOW (ref 150–400)
RBC: 4.37 MIL/uL (ref 3.87–5.11)
RDW: 11.9 % (ref 11.5–15.5)
WBC: 4.7 10*3/uL (ref 4.0–10.5)
nRBC: 0 % (ref 0.0–0.2)

## 2019-03-31 LAB — COMPREHENSIVE METABOLIC PANEL
ALT: 17 U/L (ref 0–44)
AST: 21 U/L (ref 15–41)
Albumin: 4.1 g/dL (ref 3.5–5.0)
Alkaline Phosphatase: 83 U/L (ref 38–126)
Anion gap: 9 (ref 5–15)
BUN: 23 mg/dL (ref 8–23)
CO2: 27 mmol/L (ref 22–32)
Calcium: 9.7 mg/dL (ref 8.9–10.3)
Chloride: 105 mmol/L (ref 98–111)
Creatinine, Ser: 0.83 mg/dL (ref 0.44–1.00)
GFR calc Af Amer: >60 mL/min (ref >60)
GFR calc non Af Amer: >60 mL/min (ref >60)
Glucose, Bld: 119 mg/dL — ABNORMAL HIGH (ref 70–99)
Potassium: 4.7 mmol/L (ref 3.5–5.1)
Sodium: 141 mmol/L (ref 135–145)
Total Bilirubin: 0.4 mg/dL (ref 0.3–1.2)
Total Protein: 6.9 g/dL (ref 6.5–8.1)

## 2019-04-01 LAB — VITAMIN D 25 HYDROXY (VIT D DEFICIENCY, FRACTURES): Vit D, 25-Hydroxy: 39.1 ng/mL (ref 30.0–100.0)

## 2019-04-07 ENCOUNTER — Encounter (HOSPITAL_COMMUNITY): Payer: Self-pay | Admitting: Nurse Practitioner

## 2019-04-07 ENCOUNTER — Other Ambulatory Visit (HOSPITAL_COMMUNITY): Payer: Self-pay | Admitting: Nurse Practitioner

## 2019-04-07 ENCOUNTER — Other Ambulatory Visit: Payer: Self-pay

## 2019-04-07 ENCOUNTER — Inpatient Hospital Stay (HOSPITAL_BASED_OUTPATIENT_CLINIC_OR_DEPARTMENT_OTHER): Payer: Medicare HMO | Admitting: Nurse Practitioner

## 2019-04-07 DIAGNOSIS — C50212 Malignant neoplasm of upper-inner quadrant of left female breast: Secondary | ICD-10-CM | POA: Diagnosis not present

## 2019-04-07 DIAGNOSIS — Z79811 Long term (current) use of aromatase inhibitors: Secondary | ICD-10-CM | POA: Diagnosis not present

## 2019-04-07 DIAGNOSIS — G4089 Other seizures: Secondary | ICD-10-CM | POA: Diagnosis not present

## 2019-04-07 DIAGNOSIS — Z170 Estrogen receptor positive status [ER+]: Secondary | ICD-10-CM

## 2019-04-07 DIAGNOSIS — M8580 Other specified disorders of bone density and structure, unspecified site: Secondary | ICD-10-CM | POA: Diagnosis not present

## 2019-04-07 MED ORDER — ANASTROZOLE 1 MG PO TABS: ORAL_TABLET | ORAL | 5 refills | Status: DC

## 2019-04-07 NOTE — Progress Notes (Signed)
[place], [place] [address]   CLINIC:  Medical Oncology/Hematology  PCP:  Celene Squibb, MD [place] [address] [phone]   REASON FOR VISIT: Follow-up for breast cancer  CURRENT THERAPY: Anastrozole daily  BRIEF ONCOLOGIC HISTORY:  Oncology History  Breast cancer of upper-inner quadrant of left female breast ([place])  09/10/2018 Initial Diagnosis   Breast cancer of upper-inner quadrant of left female breast ([place])   11/14/2018 Genetic Testing   Negative genetic testing on the multi-cancer panel.  The Multi-Gene Panel offered by [facility] includes sequencing and/or deletion duplication testing of the following 84 genes: AIP, ALK, APC, ATM, AXIN2,BAP1,  BARD1, BLM, BMPR1A, BRCA1, BRCA2, BRIP1, CASR, CDC73, CDH1, CDK4, CDKN1B, CDKN1C, CDKN2A (p14ARF), CDKN2A (p16INK4a), CEBPA, CHEK2, CTNNA1, DICER1, DIS3L2, EGFR (c.2369C>T, p.Thr790Met variant only), EPCAM (Deletion/duplication testing only), FH, FLCN, GATA2, GPC3, GREM1 (Promoter region deletion/duplication testing only), HOXB13 (c.251G>A, p.Gly84Glu), HRAS, KIT, MAX, MEN1, MET, MITF (c.952G>A, p.Glu318Lys variant only), MLH1, MSH2, MSH3, MSH6, MUTYH, NBN, NF1, NF2, NTHL1, PALB2, PDGFRA, PHOX2B, PMS2, POLD1, POLE, POT1, PRKAR1A, PTCH1, PTEN, RAD50, RAD51C, RAD51D, RB1, RECQL4, RET, RUNX1, SDHAF2, SDHA (sequence changes only), SDHB, SDHC, SDHD, SMAD4, SMARCA4, SMARCB1, SMARCE1, STK11, SUFU, TERC, TERT, TMEM127, TP53, TSC1, TSC2, VHL, WRN and WT1.  The report date is November 14, 2018.      INTERVAL HISTORY:  Darlene Moore 73 y.o. female returns for routine follow-up for breast cancer.  She reports she has been feeling great since her last visit.  She has no complaints at this time. Denies any nausea, vomiting, or diarrhea. Denies any new pains. Had not noticed any recent bleeding such as epistaxis, hematuria or hematochezia. Denies recent chest pain on exertion, shortness of breath on  minimal exertion, pre-syncopal episodes, or palpitations. Denies any numbness or tingling in hands or feet. Denies any recent fevers, infections, or recent hospitalizations. Patient reports appetite at 100% and energy level at 100%.  She is eating well maintaining her weight at this time.    REVIEW OF SYSTEMS:  Review of Systems  Neurological: Positive for dizziness.  All other systems reviewed and are negative.    PAST MEDICAL/SURGICAL HISTORY:  Past Medical History:  Diagnosis Date  . Concussion   . Family history of breast cancer   . Family history of kidney cancer   . Family history of ovarian cancer   . Family history of stomach cancer   . Family history of uterine cancer   . High cholesterol   . Medical history non-contributory   . Seizures ([place])   . Seizures ([place])    Past Surgical History:  Procedure Laterality Date  . CHOLECYSTECTOMY    . COLONOSCOPY    . COLONOSCOPY N/A 09/29/2015   Procedure: COLONOSCOPY;  Surgeon: Rogene Houston, MD;  Location: [facility]ITE;  Service: Endoscopy;  Laterality: N/A;  730 - moved to 9:00 - Ann to notify pt  . PARTIAL MASTECTOMY WITH NEEDLE LOCALIZATION AND AXILLARY SENTINEL LYMPH NODE BX Left 10/01/2018   Procedure: LEFT BREAST PARTIAL MASTECTOMY WITH SENTINEL LYMPH NODE BIOPSY AFTER NEEDLE LOCALIZATION AND RADIOTRACER PLACEMENT;  Surgeon: Virl Cagey, MD;  Location: [facility] ORS;  Service: General;  Laterality: Left;     SOCIAL HISTORY:  Social History   Socioeconomic History  . Marital status: Widowed    Spouse name: Not on file  . Number of children: Not on file  . Years of education: Not on file  . Highest education  level: Not on file  Occupational History  . Not on file  Social Needs  . Financial resource strain: Not on file  . Food insecurity    Worry: Not on file    Inability: Not on file  . Transportation needs    Medical: Not on file    Non-medical: Not on file  Tobacco Use  . Smoking status: Never Smoker  .  Smokeless tobacco: Never Used  Substance and Sexual Activity  . Alcohol use: No  . Drug use: No  . Sexual activity: Not on file  Lifestyle  . Physical activity    Days per week: Not on file    Minutes per session: Not on file  . Stress: Not on file  Relationships  . Social Herbalist on phone: Not on file    Gets together: Not on file    Attends religious service: Not on file    Active member of club or organization: Not on file    Attends meetings of clubs or organizations: Not on file    Relationship status: Not on file  . Intimate partner violence    Fear of current or ex partner: Not on file    Emotionally abused: Not on file    Physically abused: Not on file    Forced sexual activity: Not on file  Other Topics Concern  . Not on file  Social History Narrative   Lives home alone. Widow.   Retired/ not working.   Education 12th grade.  Children 1.  Drinks decaff coffee, tea.     FAMILY HISTORY:  Family History  Problem Relation Age of Onset  . Breast cancer Sister 38       d. 72  . Liver cancer Sister   . Breast cancer Sister 92       double mastectomy  . Diabetes Mother   . Heart disease Father        d. 44  . Seizures Other        maternal  . Kidney cancer Maternal Aunt   . Kidney cancer Maternal Uncle   . Brain cancer Paternal Aunt   . Uterine cancer Maternal Grandmother   . Leukemia Paternal Grandmother   . Ovarian cancer Niece 88  . Uterine cancer Niece 53  . Stomach cancer Niece 17  . Stomach cancer Maternal Uncle   . Kidney cancer Paternal Aunt   . Kidney cancer Paternal Aunt   . Bladder Cancer Paternal Aunt   . Skin cancer Paternal Aunt        on ankle; d. 99    CURRENT MEDICATIONS:  Outpatient Encounter Medications as of 04/07/2019  Medication Sig  . anastrozole (ARIMIDEX) 1 MG tablet TAKE 1 TABLET(1 MG) BY MOUTH DAILY  . Calcium Carbonate (CALCIUM 600 PO) Take by mouth 2 (two) times daily.  . Cinnamon 500 MG TABS Take 1,000 mg by  mouth daily.   Marland Kitchen docusate sodium (COLACE) 100 MG capsule Take 1 capsule (100 mg total) by mouth 2 (two) times daily.  Marland Kitchen ibuprofen (ADVIL,MOTRIN) 200 MG tablet Take 400 mg by mouth every 8 (eight) hours as needed for mild pain or moderate pain.   Marland Kitchen levETIRAcetam (KEPPRA XR) 500 MG 24 hr tablet Take 3 tablets (1,500 mg total) by mouth 2 (two) times daily.  Marland Kitchen loratadine (CLARITIN) 10 MG tablet Take 10 mg by mouth daily.  . meclizine (ANTIVERT) 25 MG tablet Take 1 tablet (25 mg total) by mouth 3 (  three) times daily as needed for dizziness.  . Misc Natural Products (LEG VEIN & CIRCULATION PO) Take 1 tablet by mouth daily.  . Multiple Vitamin (MULTIVITAMIN) tablet Take 1 tablet by mouth daily.  . Probiotic Product (PROBIOTIC PO) Take 1 capsule by mouth daily.  Marland Kitchen VITAMIN D PO Take 1,000 Units by mouth.  . [DISCONTINUED] anastrozole (ARIMIDEX) 1 MG tablet TAKE 1 TABLET(1 MG) BY MOUTH DAILY  . [DISCONTINUED] anastrozole (ARIMIDEX) 1 MG tablet TAKE 1 TABLET(1 MG) BY MOUTH DAILY   No facility-administered encounter medications on file as of 04/07/2019.     ALLERGIES:  No Known Allergies   PHYSICAL EXAM:  ECOG Performance status: 1  Vitals:   04/07/19 1300  BP: 122/73  Pulse: (!) 111  Resp: 18  Temp: 97.7 F (36.5 C)  SpO2: 100%   Filed Weights   04/07/19 1300  Weight: 207 lb 5 oz (94 kg)    Physical Exam Constitutional:      Appearance: Normal appearance. She is normal weight.  Cardiovascular:     Rate and Rhythm: Normal rate and regular rhythm.     Heart sounds: Normal heart sounds.  Pulmonary:     Effort: Pulmonary effort is normal.     Breath sounds: Normal breath sounds.  Abdominal:     General: Bowel sounds are normal.     Palpations: Abdomen is soft.  Musculoskeletal: Normal range of motion.  Skin:    General: Skin is warm and dry.  Neurological:     Mental Status: She is alert and oriented to person, place, and time. Mental status is at baseline.  Psychiatric:         Mood and Affect: Mood normal.        Behavior: Behavior normal.        Thought Content: Thought content normal.        Judgment: Judgment normal.      LABORATORY DATA:  I have reviewed the labs as listed.  CBC    Component Value Date/Time   WBC 4.7 03/31/2019 1317   RBC 4.37 03/31/2019 1317   HGB 13.1 03/31/2019 1317   HCT 41.5 03/31/2019 1317   PLT 145 (L) 03/31/2019 1317   MCV 95.0 03/31/2019 1317   MCH 30.0 03/31/2019 1317   MCHC 31.6 03/31/2019 1317   RDW 11.9 03/31/2019 1317   LYMPHSABS 0.8 03/31/2019 1317   MONOABS 0.4 03/31/2019 1317   EOSABS 0.1 03/31/2019 1317   BASOSABS 0.1 03/31/2019 1317   CMP Latest Ref Rng & Units 03/31/2019 12/10/2018 10/23/2018  Glucose 70 - 99 mg/dL 119(H) 113(H) 97  BUN 8 - 23 mg/dL _0 Creatinine 0.44 - 1.00 mg/dL 0.83 0.71 0.59  Sodium 135 - 145 mmol/L 141 140 141  Potassium 3.5 - 5.1 mmol/L 4.7 4.7 3.7  Chloride 98 - 111 mmol/L 105 105 105  CO2 22 - 32 mmol/L _1 Calcium 8.9 - 10.3 mg/dL 9.7 9.4 9.2  Total Protein 6.5 - 8.1 g/dL 6.9 7.4 -  Total Bilirubin 0.3 - 1.2 mg/dL 0.4 0.5 -  Alkaline Phos 38 - 126 U/L 83 93 -  AST 15 - 41 U/L 21 23 -  ALT 0 - 44 U/L 17 17 -    I personally performed a face-to-face visit.  All questions were answered to patient's stated satisfaction. Encouraged patient to call with any new concerns or questions before his next visit to the cancer center and we can certain see him sooner,  if needed.     ASSESSMENT & PLAN:   Breast cancer of upper-inner quadrant of left female breast ([place]) 1. Stage I (PT1cPN0) left breast invasive ductal carcinoma (IDC): -Screening mammogram on 08/07/2018 once BI-RADS Category 0. - She had left breast mammogram on 08/26/2018 with additional views, and ultrasound showing 0.9 x 0.9 x 0.7 cm mass at 10 o'clock position.  She also had an ultrasound of the left axilla at that time which was negative. - Breast biopsy was done on 09/02/2018.  This showed IDC, grade 1, DCIS  intermediate grade, ER/PR positive, her-2 negative. -She underwent left breast lumpectomy and sentinel lymph node biopsy on 10/01/2018. - Dr. Delton Coombes had a prolonged discussion about her pathology report.  It shows 1.6 cm IDC, grade 1.  The deep margins were positive even though it was resected to the pectoralis muscle.  0/1 lymph nodes was involved.  ER was 100% positive, PR 20% and HER-2/neu negative.  Ki 67 was 2%. - Because of the positive deep margins, Dr. Delton Coombes recommended radiation therapy.  She was also scheduled for another mammogram as they could not trace the radioactive seed. - She was referred to Dr. Francesca Jewett for radiation.  She reports she has not started yet due to being pushed out for more critical patients. -She was started on antiestrogen therapy with anastrozole 1 mg daily for 5 years. -She is tolerating the anastrozole well with no side effects at this time. -She is getting the recommended daily exercise. - She is due for her yearly mammogram in December. -We will see her back in December after her mammogram with labs.  2.osteopenia: - DEXA scan on 07/18/2015 showed T score of -1.7. - DEXA scan on 11/06/2018 showed a T score of -2.1.  Results are reviewed with the patient on 12/10/2018. - She stopped her calcium due to constipation.  She was reeducated on the importance of it and that she may add a stool softener with that. - She was educated on Prolia to prevent osteoporosis.  However she wanted to start taking her calcium twice a day and wait on that treatment. -We will repeat a DEXA scan in 2 years.  3.seizure disorder: -She will continue Keppra 1500 mg p.o. twice daily.        Orders placed this encounter:  Orders Placed This Encounter  Procedures  . MM DIAG BREAST TOMO BILATERAL  . Lactate dehydrogenase  . CBC with Differential/Platelet  . Comprehensive metabolic panel  . Vitamin B12  . VITAMIN D 25 Hydroxy (Vit-D Deficiency, Fractures)     Francene Finders, FNP-C [place] [phone]

## 2019-04-07 NOTE — Assessment & Plan Note (Addendum)
1. Stage I (PT1cPN0) left breast invasive ductal carcinoma (IDC): -Screening mammogram on 08/07/2018 once BI-RADS Category 0. - She had left breast mammogram on 08/26/2018 with additional views, and ultrasound showing 0.9 x 0.9 x 0.7 cm mass at 10 o'clock position.  She also had an ultrasound of the left axilla at that time which was negative. - Breast biopsy was done on 09/02/2018.  This showed IDC, grade 1, DCIS intermediate grade, ER/PR positive, her-2 negative. -She underwent left breast lumpectomy and sentinel lymph node biopsy on 10/01/2018. - Dr. Delton Coombes had a prolonged discussion about her pathology report.  It shows 1.6 cm IDC, grade 1.  The deep margins were positive even though it was resected to the pectoralis muscle.  0/1 lymph nodes was involved.  ER was 100% positive, PR 20% and HER-2/neu negative.  Ki 67 was 2%. - Because of the positive deep margins, Dr. Delton Coombes recommended radiation therapy.  She was also scheduled for another mammogram as they could not trace the radioactive seed. - She was referred to Dr. Francesca Jewett for radiation.  She reports she has not started yet due to being pushed out for more critical patients. -She was started on antiestrogen therapy with anastrozole 1 mg daily for 5 years. -She is tolerating the anastrozole well with no side effects at this time. -She is getting the recommended daily exercise. - She is due for her yearly mammogram in December. -We will see her back in December after her mammogram with labs.  2.osteopenia: - DEXA scan on 07/18/2015 showed T score of -1.7. - DEXA scan on 11/06/2018 showed a T score of -2.1.  Results are reviewed with the patient on 12/10/2018. - She stopped her calcium due to constipation.  She was reeducated on the importance of it and that she may add a stool softener with that. - She was educated on Prolia to prevent osteoporosis.  However she wanted to start taking her calcium twice a day and wait on that  treatment. -We will repeat a DEXA scan in 2 years.  3.seizure disorder: -She will continue Keppra 1500 mg p.o. twice daily.

## 2019-04-07 NOTE — Patient Instructions (Signed)
[place] at [facility] Discharge Instructions  Follow up in 5 months with labs after your mammogram.   Thank you for choosing [place] at [facility] to provide your oncology and hematology care.  To afford each patient quality time with our provider, please arrive at least 15 minutes before your scheduled appointment time.   If you have a lab appointment with the [facility] please come in thru the  Main Entrance and check in at the main information desk  You need to re-schedule your appointment should you arrive 10 or more minutes late.  We strive to give you quality time with our providers, and arriving late affects you and other patients whose appointments are after yours.  Also, if you no show three or more times for appointments you may be dismissed from the clinic at the providers discretion.     Again, thank you for choosing [facility].  Our hope is that these requests will decrease the amount of time that you wait before being seen by our physicians.       _____________________________________________________________  Should you have questions after your visit to [facility], please contact our office at [phone] between the hours of 8:00 a.m. and 4:30 p.m.  Voicemails left after 4:00 p.m. will not be returned until the following business day.  For prescription refill requests, have your pharmacy contact our office and allow 72 hours.    Cancer Center Support Programs:   > Cancer Support Group  2nd Tuesday of the month 1pm-2pm, Journey Room

## 2019-05-26 ENCOUNTER — Encounter (HOSPITAL_COMMUNITY): Payer: Self-pay | Admitting: Nurse Practitioner

## 2019-05-26 ENCOUNTER — Other Ambulatory Visit (HOSPITAL_COMMUNITY): Payer: Self-pay | Admitting: *Deleted

## 2019-05-26 ENCOUNTER — Telehealth (HOSPITAL_COMMUNITY): Payer: Self-pay | Admitting: *Deleted

## 2019-05-26 ENCOUNTER — Other Ambulatory Visit (HOSPITAL_COMMUNITY): Payer: Self-pay | Admitting: Nurse Practitioner

## 2019-05-26 DIAGNOSIS — H5210 Myopia, unspecified eye: Secondary | ICD-10-CM | POA: Diagnosis not present

## 2019-05-26 DIAGNOSIS — Z0100 Encounter for examination of eyes and vision without abnormal findings: Secondary | ICD-10-CM | POA: Diagnosis not present

## 2019-05-26 NOTE — Telephone Encounter (Signed)
Pt called the clinic c/o "my legs is hurting, my body is aching, I can hardly walk" x 2 weeks.  Pt reports she was advised at her last appointment to "double up" on her calcium and Vitamin D supplements (per last office visit note, pt was advised to restart calcium 600 mg po bid d/t she had stopped taking it altogether because of it making her constipated).  She states she is currently taking calcium 1200 mg po bid and Vitamin D 1000 units bid.  Notified R. Lockamy, NP of the above.  Pt instructed by me per NP to decrease calcium to 600 mg po bid.  She can try this for about a week and if her aches and pains are still present, she may decrease her calcium to 600 mg daily.  She was instructed to call the clinic if she continues to have aches/pains after dropping down to calcium 600 mg daily.  Pt was also advised to take over the counter Colace 100 mg bid to help prevent constipation r/t calcium supplementation.  She was also advised to continue taking the Vitamin D 1000 units bid.  Pt verbalizes understanding of all of the above.

## 2019-06-02 ENCOUNTER — Encounter (HOSPITAL_COMMUNITY): Payer: Self-pay | Admitting: Nurse Practitioner

## 2019-06-03 ENCOUNTER — Telehealth (HOSPITAL_COMMUNITY): Payer: Self-pay

## 2019-06-03 ENCOUNTER — Other Ambulatory Visit (HOSPITAL_COMMUNITY): Payer: Self-pay | Admitting: Nurse Practitioner

## 2019-06-03 ENCOUNTER — Telehealth: Payer: Self-pay | Admitting: *Deleted

## 2019-06-03 NOTE — Telephone Encounter (Signed)
Patient sent a MyChart message regarding her calcium.  Pt was advised to take two calcium with vitamin D supplements every day at her last OV.  She started having leg pain/cramps after increasing to two pill qd.  She stopped taking it and leg pain improved.  She contacted clinic and was advised to restart taking just one pill per day.  Leg pain/cramps returned with just one pill.  Patient wanted to know if she needed to have labs drawn.  I spoke with Randi.  She evaluated her most recent labs and suggested she stop calcium supplement or try taking it every other day until her next appointment.  Patient was informed.

## 2019-06-03 NOTE — Telephone Encounter (Signed)
Received my chart message stating she had a seizure a week ago. Per Dr Domenica Fail, called patient to advise she needs a FU. She stated she'll need to call her son, Legrand Como who would drive her. She'll have Legrand Como call us back to schedule FU with Dr Leta Baptist next Mon or Tues. She is aware Dr Leta Baptist will be out of the office after next Tues for rest of week.  I gave her office number. She does not have video ppt capability. Patient verbalized understanding, appreciation.

## 2019-06-08 ENCOUNTER — Encounter: Payer: Self-pay | Admitting: Diagnostic Neuroimaging

## 2019-06-08 ENCOUNTER — Ambulatory Visit (INDEPENDENT_AMBULATORY_CARE_PROVIDER_SITE_OTHER): Payer: Medicare HMO | Admitting: Diagnostic Neuroimaging

## 2019-06-08 ENCOUNTER — Other Ambulatory Visit: Payer: Self-pay

## 2019-06-08 VITALS — BP 120/65 | HR 62 | Temp 98.2°F | Ht 66.0 in | Wt 210.4 lb

## 2019-06-08 DIAGNOSIS — G40109 Localization-related (focal) (partial) symptomatic epilepsy and epileptic syndromes with simple partial seizures, not intractable, without status epilepticus: Secondary | ICD-10-CM | POA: Diagnosis not present

## 2019-06-08 MED ORDER — LACOSAMIDE 50 MG PO TABS: 50.0000 mg | ORAL_TABLET | Freq: Two times a day (BID) | ORAL | 5 refills | Status: DC
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
## 2019-06-08 NOTE — Patient Instructions (Signed)
-   continue levetiracetam 1500mg  twice a day  - start vimpat 50mg  twice a day

## 2019-06-19 ENCOUNTER — Other Ambulatory Visit: Payer: Self-pay | Admitting: Diagnostic Neuroimaging

## 2019-08-11 ENCOUNTER — Other Ambulatory Visit (HOSPITAL_COMMUNITY): Payer: Medicare HMO

## 2019-08-11 ENCOUNTER — Ambulatory Visit (HOSPITAL_COMMUNITY): Admission: RE | Admit: 2019-08-11 | Payer: Medicare HMO | Source: Ambulatory Visit

## 2019-08-11 ENCOUNTER — Inpatient Hospital Stay (HOSPITAL_COMMUNITY): Payer: Medicare HMO | Attending: Nurse Practitioner

## 2019-08-11 ENCOUNTER — Ambulatory Visit (HOSPITAL_COMMUNITY)
Admission: RE | Admit: 2019-08-11 | Discharge: 2019-08-11 | Disposition: A | Discharge status: Home / Self Care | Payer: Medicare HMO | Source: Ambulatory Visit | Attending: Nurse Practitioner | Admitting: Nurse Practitioner

## 2019-08-11 ENCOUNTER — Ambulatory Visit (HOSPITAL_COMMUNITY): Payer: Medicare HMO

## 2019-08-11 ENCOUNTER — Other Ambulatory Visit: Payer: Self-pay

## 2019-08-11 DIAGNOSIS — Z170 Estrogen receptor positive status [ER+]: Secondary | ICD-10-CM | POA: Insufficient documentation

## 2019-08-11 DIAGNOSIS — K5900 Constipation, unspecified: Secondary | ICD-10-CM | POA: Insufficient documentation

## 2019-08-11 DIAGNOSIS — C50212 Malignant neoplasm of upper-inner quadrant of left female breast: Secondary | ICD-10-CM | POA: Insufficient documentation

## 2019-08-11 DIAGNOSIS — M8580 Other specified disorders of bone density and structure, unspecified site: Secondary | ICD-10-CM | POA: Diagnosis not present

## 2019-08-11 DIAGNOSIS — Z79811 Long term (current) use of aromatase inhibitors: Secondary | ICD-10-CM | POA: Diagnosis not present

## 2019-08-11 DIAGNOSIS — R928 Other abnormal and inconclusive findings on diagnostic imaging of breast: Secondary | ICD-10-CM | POA: Diagnosis not present

## 2019-08-11 LAB — CBC WITH DIFFERENTIAL/PLATELET
Abs Immature Granulocytes: 0 10*3/uL (ref 0.00–0.07)
Basophils Absolute: 0 10*3/uL (ref 0.0–0.1)
Basophils Relative: 1 %
Eosinophils Absolute: 0.1 10*3/uL (ref 0.0–0.5)
Eosinophils Relative: 2 %
HCT: 42.6 % (ref 36.0–46.0)
Hemoglobin: 13.9 g/dL (ref 12.0–15.0)
Immature Granulocytes: 0 %
Lymphocytes Relative: 23 %
Lymphs Abs: 0.9 10*3/uL (ref 0.7–4.0)
MCH: 30.8 pg (ref 26.0–34.0)
MCHC: 32.6 g/dL (ref 30.0–36.0)
MCV: 94.2 fL (ref 80.0–100.0)
Monocytes Absolute: 0.3 10*3/uL (ref 0.1–1.0)
Monocytes Relative: 8 %
Neutro Abs: 2.5 10*3/uL (ref 1.7–7.7)
Neutrophils Relative %: 66 %
Platelets: 126 10*3/uL — ABNORMAL LOW (ref 150–400)
RBC: 4.52 MIL/uL (ref 3.87–5.11)
RDW: 11.9 % (ref 11.5–15.5)
WBC: 3.7 10*3/uL — ABNORMAL LOW (ref 4.0–10.5)
nRBC: 0 % (ref 0.0–0.2)

## 2019-08-11 LAB — COMPREHENSIVE METABOLIC PANEL
ALT: 18 U/L (ref 0–44)
AST: 22 U/L (ref 15–41)
Albumin: 4.4 g/dL (ref 3.5–5.0)
Alkaline Phosphatase: 87 U/L (ref 38–126)
Anion gap: 11 (ref 5–15)
BUN: 18 mg/dL (ref 8–23)
CO2: 25 mmol/L (ref 22–32)
Calcium: 9.6 mg/dL (ref 8.9–10.3)
Chloride: 104 mmol/L (ref 98–111)
Creatinine, Ser: 0.78 mg/dL (ref 0.44–1.00)
GFR calc Af Amer: >60 mL/min (ref >60)
GFR calc non Af Amer: >60 mL/min (ref >60)
Glucose, Bld: 101 mg/dL — ABNORMAL HIGH (ref 70–99)
Potassium: 4.1 mmol/L (ref 3.5–5.1)
Sodium: 140 mmol/L (ref 135–145)
Total Bilirubin: 0.8 mg/dL (ref 0.3–1.2)
Total Protein: 7.4 g/dL (ref 6.5–8.1)

## 2019-08-11 LAB — LACTATE DEHYDROGENASE: LDH: 160 U/L (ref 98–192)

## 2019-08-11 LAB — VITAMIN B12: Vitamin B-12: 497 pg/mL (ref 180–914)

## 2019-08-12 LAB — VITAMIN D 25 HYDROXY (VIT D DEFICIENCY, FRACTURES): Vit D, 25-Hydroxy: 49.2 ng/mL (ref 30–100)

## 2019-08-13 ENCOUNTER — Other Ambulatory Visit: Payer: Self-pay

## 2019-08-13 ENCOUNTER — Inpatient Hospital Stay (HOSPITAL_COMMUNITY): Payer: Medicare HMO | Admitting: Nurse Practitioner

## 2019-08-13 DIAGNOSIS — C50212 Malignant neoplasm of upper-inner quadrant of left female breast: Secondary | ICD-10-CM

## 2019-08-13 DIAGNOSIS — M8580 Other specified disorders of bone density and structure, unspecified site: Secondary | ICD-10-CM | POA: Diagnosis not present

## 2019-08-13 DIAGNOSIS — K5900 Constipation, unspecified: Secondary | ICD-10-CM | POA: Diagnosis not present

## 2019-08-13 DIAGNOSIS — Z170 Estrogen receptor positive status [ER+]: Secondary | ICD-10-CM

## 2019-08-13 DIAGNOSIS — Z79811 Long term (current) use of aromatase inhibitors: Secondary | ICD-10-CM | POA: Diagnosis not present

## 2019-08-13 NOTE — Assessment & Plan Note (Addendum)
1. Stage I (PT1cPN0) left breast invasive ductal carcinoma (IDC): -Screening mammogram on 08/07/2018 once BI-RADS Category 0. - She had left breast mammogram on 08/26/2018 with additional views, and ultrasound showing 0.9 x 0.9 x 0.7 cm mass at 10 o'clock position.  She also had an ultrasound of the left axilla at that time which was negative. - Breast biopsy was done on 09/02/2018.  This showed IDC, grade 1, DCIS intermediate grade, ER/PR positive, her-2 negative. -She underwent left breast lumpectomy and sentinel lymph node biopsy on 10/01/2018. - Dr. Delton Coombes had a prolonged discussion about her pathology report.  It shows 1.6 cm IDC, grade 1.  The deep margins were positive even though it was resected to the pectoralis muscle.  0/1 lymph nodes was involved.  ER was 100% positive, PR 20% and HER-2/neu negative.  Ki 67 was 2%. - Because of the positive deep margins, Dr. Delton Coombes recommended radiation therapy.  She was also scheduled for another mammogram as they could not trace the radioactive seed. - She was referred to Dr. Francesca Jewett for radiation. Looks like she was seen by him in June 2020 and has another follow up in December 2020.  -She was started on antiestrogen therapy with anastrozole 1 mg daily for 5 years. -She is tolerating the anastrozole well with no side effects at this time. -She is getting the recommended daily exercise. -Mammogram was done on 08/11/2019 and was B RADS category 2 benign -Labs on 08/11/2019 showed creatinine 0.78, LDH 160, WBC 3.7, hemoglobin 13.9, platelets 126. -We will see her back in 4 months with repeat labs  2.osteopenia: - DEXA scan on 07/18/2015 showed T score of -1.7. - DEXA scan on 11/06/2018 showed a T score of -2.1.  Results are reviewed with the patient on 12/10/2018. - She stopped her calcium due to constipation.  She was reeducated on the importance of it and that she may add a stool softener with that. - She was educated on Prolia to prevent  osteoporosis.  However she wanted to start taking her calcium twice a day and wait on that treatment. -We will repeat a DEXA scan in 2 years.  3.seizure disorder: -She will continue Keppra 1500 mg p.o. twice daily.

## 2019-08-13 NOTE — Progress Notes (Signed)
[address], [place] [address]   CLINIC:  Medical Oncology/Hematology  PCP:  Celene Squibb, MD [place] [address] [phone]   REASON FOR VISIT: Follow-up for breast cancer  CURRENT THERAPY: Anastrozole  BRIEF ONCOLOGIC HISTORY:  Oncology History  Breast cancer of upper-inner quadrant of left female breast ([place])  09/10/2018 Initial Diagnosis   Breast cancer of upper-inner quadrant of left female breast ([place])   11/14/2018 Genetic Testing   Negative genetic testing on the multi-cancer panel.  The Multi-Gene Panel offered by [facility] includes sequencing and/or deletion duplication testing of the following 84 genes: AIP, ALK, APC, ATM, AXIN2,BAP1,  BARD1, BLM, BMPR1A, BRCA1, BRCA2, BRIP1, CASR, CDC73, CDH1, CDK4, CDKN1B, CDKN1C, CDKN2A (p14ARF), CDKN2A (p16INK4a), CEBPA, CHEK2, CTNNA1, DICER1, DIS3L2, EGFR (c.2369C>T, p.Thr790Met variant only), EPCAM (Deletion/duplication testing only), FH, FLCN, GATA2, GPC3, GREM1 (Promoter region deletion/duplication testing only), HOXB13 (c.251G>A, p.Gly84Glu), HRAS, KIT, MAX, MEN1, MET, MITF (c.952G>A, p.Glu318Lys variant only), MLH1, MSH2, MSH3, MSH6, MUTYH, NBN, NF1, NF2, NTHL1, PALB2, PDGFRA, PHOX2B, PMS2, POLD1, POLE, POT1, PRKAR1A, PTCH1, PTEN, RAD50, RAD51C, RAD51D, RB1, RECQL4, RET, RUNX1, SDHAF2, SDHA (sequence changes only), SDHB, SDHC, SDHD, SMAD4, SMARCA4, SMARCB1, SMARCE1, STK11, SUFU, TERC, TERT, TMEM127, TP53, TSC1, TSC2, VHL, WRN and WT1.  The report date is November 14, 2018.     INTERVAL HISTORY:  Darlene Moore 73 y.o. female returns for routine follow-up for breast cancer.  Patient reports she is taking her anastrozole as prescribed. She is doing well since her last visit. Denies any nausea, vomiting, or diarrhea. Denies any new pains. Had not noticed any recent bleeding such as epistaxis, hematuria or hematochezia. Denies recent chest pain on exertion, shortness of breath on  minimal exertion, pre-syncopal episodes, or palpitations. Denies any numbness or tingling in hands or feet. Denies any recent fevers, infections, or recent hospitalizations. Patient reports appetite at 100% and energy level at 100%.  He is eating well maintaining her weight at this time.    REVIEW OF SYSTEMS:  Review of Systems  Gastrointestinal: Positive for constipation.  Neurological: Positive for dizziness.  Psychiatric/Behavioral: Positive for sleep disturbance.  All other systems reviewed and are negative.    PAST MEDICAL/SURGICAL HISTORY:  Past Medical History:  Diagnosis Date   Concussion    Family history of breast cancer    Family history of kidney cancer    Family history of ovarian cancer    Family history of stomach cancer    Family history of uterine cancer    High cholesterol    Medical history non-contributory    Seizures ([place])    last seizure 05/27/19   Past Surgical History:  Procedure Laterality Date   CHOLECYSTECTOMY     COLONOSCOPY     COLONOSCOPY N/A 09/29/2015   Procedure: COLONOSCOPY;  Surgeon: Rogene Houston, MD;  Location: [facility];  Service: Endoscopy;  Laterality: N/A;  730 - moved to 9:00 - Ann to notify pt   PARTIAL MASTECTOMY WITH NEEDLE LOCALIZATION AND AXILLARY SENTINEL LYMPH NODE BX Left 10/01/2018   Procedure: LEFT BREAST PARTIAL MASTECTOMY WITH SENTINEL LYMPH NODE BIOPSY AFTER NEEDLE LOCALIZATION AND RADIOTRACER PLACEMENT;  Surgeon: Virl Cagey, MD;  Location: [facility] ORS;  Service: General;  Laterality: Left;     SOCIAL HISTORY:  Social History   Socioeconomic History   Marital status: Widowed    Spouse name: Not on file   Number of children: 1   Years of education: 53  Highest education level: Not on file  Occupational History   Not on file  Social Needs   Financial resource strain: Not on file   Food insecurity    Worry: Not on file    Inability: Not on file   Transportation needs    Medical: Not on  file    Non-medical: Not on file  Tobacco Use   Smoking status: Never Smoker   Smokeless tobacco: Never Used  Substance and Sexual Activity   Alcohol use: No   Drug use: No   Sexual activity: Not on file  Lifestyle   Physical activity    Days per week: Not on file    Minutes per session: Not on file   Stress: Not on file  Relationships   Social connections    Talks on phone: Not on file    Gets together: Not on file    Attends religious service: Not on file    Active member of club or organization: Not on file    Attends meetings of clubs or organizations: Not on file    Relationship status: Not on file   Intimate partner violence    Fear of current or ex partner: Not on file    Emotionally abused: Not on file    Physically abused: Not on file    Forced sexual activity: Not on file  Other Topics Concern   Not on file  Social History Narrative   Lives home alone. Widow.   Retired/ not working.   Education 12th grade.  Children 1.  Drinks decaff coffee, tea.     FAMILY HISTORY:  Family History  Problem Relation Age of Onset   Breast cancer Sister 57       d. 67   Liver cancer Sister    Breast cancer Sister 25       double mastectomy   Diabetes Mother    Heart disease Father        d. 53   Seizures Other        maternal   Kidney cancer Maternal Aunt    Kidney cancer Maternal Uncle    Brain cancer Paternal Aunt    Uterine cancer Maternal Grandmother    Leukemia Paternal Grandmother    Ovarian cancer Niece 63   Uterine cancer Niece 69   Stomach cancer Niece 12   Stomach cancer Maternal Uncle    Kidney cancer Paternal Aunt    Kidney cancer Paternal Aunt    Bladder Cancer Paternal Aunt    Skin cancer Paternal Aunt        on ankle; d. 99    CURRENT MEDICATIONS:  Outpatient Encounter Medications as of 08/13/2019  Medication Sig   anastrozole (ARIMIDEX) 1 MG tablet TAKE 1 TABLET(1 MG) BY MOUTH DAILY   Calcium Carbonate (CALCIUM  600 PO) Take by mouth 2 (two) times daily.   ibuprofen (ADVIL,MOTRIN) 200 MG tablet Take 400 mg by mouth every 8 (eight) hours as needed for mild pain or moderate pain.    lacosamide (VIMPAT) 50 MG TABS tablet Take 1 tablet (50 mg total) by mouth 2 (two) times daily.   levETIRAcetam (KEPPRA XR) 500 MG 24 hr tablet TAKE 3 TABLETS TWICE DAILY   Multiple Vitamin (MULTIVITAMIN) tablet Take 1 tablet by mouth daily.   potassium citrate (UROCIT-K) 10 MEQ (1080 MG) SR tablet Take 10 mEq by mouth 3 (three) times a week.   triamcinolone (NASACORT ALLERGY 24HR) 55 MCG/ACT AERO nasal inhaler Place 2 sprays  into the nose daily.   VITAMIN D PO Take 1,000 Units by mouth.   No facility-administered encounter medications on file as of 08/13/2019.     ALLERGIES:  No Known Allergies   PHYSICAL EXAM:  ECOG Performance status: 1  Vitals:   08/13/19 1423  BP: 100/74  Pulse: 63  Resp: 18  Temp: (!) 97.3 F (36.3 C)  SpO2: 98%   Filed Weights   08/13/19 1423  Weight: 206 lb 6.4 oz (93.6 kg)    Physical Exam Constitutional:      Appearance: Normal appearance. She is normal weight.  Cardiovascular:     Rate and Rhythm: Normal rate and regular rhythm.     Heart sounds: Normal heart sounds.  Pulmonary:     Effort: Pulmonary effort is normal.     Breath sounds: Normal breath sounds.  Abdominal:     General: Bowel sounds are normal.     Palpations: Abdomen is soft.  Musculoskeletal: Normal range of motion.  Skin:    General: Skin is warm.  Neurological:     Mental Status: She is alert and oriented to person, place, and time. Mental status is at baseline.  Psychiatric:        Mood and Affect: Mood normal.        Behavior: Behavior normal.        Thought Content: Thought content normal.        Judgment: Judgment normal.      LABORATORY DATA:  I have reviewed the labs as listed.  CBC    Component Value Date/Time   WBC 3.7 (L) 08/11/2019 0944   RBC 4.52 08/11/2019 0944   HGB  13.9 08/11/2019 0944   HCT 42.6 08/11/2019 0944   PLT 126 (L) 08/11/2019 0944   MCV 94.2 08/11/2019 0944   MCH 30.8 08/11/2019 0944   MCHC 32.6 08/11/2019 0944   RDW 11.9 08/11/2019 0944   LYMPHSABS 0.9 08/11/2019 0944   MONOABS 0.3 08/11/2019 0944   EOSABS 0.1 08/11/2019 0944   BASOSABS 0.0 08/11/2019 0944   CMP Latest Ref Rng & Units 08/11/2019 03/31/2019 12/10/2018  Glucose 70 - 99 mg/dL 101(H) 119(H) 113(H)  BUN 8 - 23 mg/dL _0 Creatinine 0.44 - 1.00 mg/dL 0.78 0.83 0.71  Sodium 135 - 145 mmol/L 140 141 140  Potassium 3.5 - 5.1 mmol/L 4.1 4.7 4.7  Chloride 98 - 111 mmol/L 104 105 105  CO2 22 - 32 mmol/L _1 Calcium 8.9 - 10.3 mg/dL 9.6 9.7 9.4  Total Protein 6.5 - 8.1 g/dL 7.4 6.9 7.4  Total Bilirubin 0.3 - 1.2 mg/dL 0.8 0.4 0.5  Alkaline Phos 38 - 126 U/L 87 83 93  AST 15 - 41 U/L _2 ALT 0 - 44 U/L _3 DIAGNOSTIC IMAGING:  I have independently reviewed the mammogram scan and discussed with the patient.  I personally performed a face-to-face visit.  All questions were answered to patient's stated satisfaction. Encouraged patient to call with any new concerns or questions before his next visit to the cancer center and we can certain see him sooner, if needed.     ASSESSMENT & PLAN:   Breast cancer of upper-inner quadrant of left female breast ([place]) 1. Stage I (PT1cPN0) left breast invasive ductal carcinoma (IDC): -Screening mammogram on 08/07/2018 once BI-RADS Category 0. - She had left breast mammogram on 08/26/2018 with additional views, and ultrasound showing 0.9 x 0.9 x  0.7 cm mass at 10 o'clock position.  She also had an ultrasound of the left axilla at that time which was negative. - Breast biopsy was done on 09/02/2018.  This showed IDC, grade 1, DCIS intermediate grade, ER/PR positive, her-2 negative. -She underwent left breast lumpectomy and sentinel lymph node biopsy on 10/01/2018. - Dr. Delton Coombes had a prolonged discussion about her  pathology report.  It shows 1.6 cm IDC, grade 1.  The deep margins were positive even though it was resected to the pectoralis muscle.  0/1 lymph nodes was involved.  ER was 100% positive, PR 20% and HER-2/neu negative.  Ki 67 was 2%. - Because of the positive deep margins, Dr. Delton Coombes recommended radiation therapy.  She was also scheduled for another mammogram as they could not trace the radioactive seed. - She was referred to Dr. Francesca Jewett for radiation. Looks like she was seen by him in June 2020 and has another follow up in December 2020.  -She was started on antiestrogen therapy with anastrozole 1 mg daily for 5 years. -She is tolerating the anastrozole well with no side effects at this time. -She is getting the recommended daily exercise. -Mammogram was done on 08/11/2019 and was B RADS category 2 benign -Labs on 08/11/2019 showed creatinine 0.78, LDH 160, WBC 3.7, hemoglobin 13.9, platelets 126. -We will see her back in 4 months with repeat labs  2.osteopenia: - DEXA scan on 07/18/2015 showed T score of -1.7. - DEXA scan on 11/06/2018 showed a T score of -2.1.  Results are reviewed with the patient on 12/10/2018. - She stopped her calcium due to constipation.  She was reeducated on the importance of it and that she may add a stool softener with that. - She was educated on Prolia to prevent osteoporosis.  However she wanted to start taking her calcium twice a day and wait on that treatment. -We will repeat a DEXA scan in 2 years.  3.seizure disorder: -She will continue Keppra 1500 mg p.o. twice daily.        Orders placed this encounter:  Orders Placed This Encounter  Procedures   Lactate dehydrogenase   CBC with Differential/Platelet   Comprehensive metabolic panel   Vitamin V74   Vitamin D 25 hydroxy   Copper, serum   Methylmalonic acid, serum   Folate      Darlene Finders, FNP-C [place] [phone]

## 2019-08-13 NOTE — Patient Instructions (Signed)
[facility] Discharge Instructions  Follow up in 4 months with labs    Thank you for choosing [facility] at [facility] to provide your oncology and hematology care.  To afford each patient quality time with our provider, please arrive at least 15 minutes before your scheduled appointment time.   If you have a lab appointment with the Cancer Center please come in thru the Main Entrance and check in at the main information desk.  You need to re-schedule your appointment should you arrive 10 or more minutes late.  We strive to give you quality time with our providers, and arriving late affects you and other patients whose appointments are after yours.  Also, if you no show three or more times for appointments you may be dismissed from the clinic at the providers discretion.     Again, thank you for choosing [facility].  Our hope is that these requests will decrease the amount of time that you wait before being seen by our physicians.       _____________________________________________________________  Should you have questions after your visit to [facility], please contact our office at [phone] between the hours of 8:00 a.m. and 4:30 p.m.  Voicemails left after 4:00 p.m. will not be returned until the following business day.  For prescription refill requests, have your pharmacy contact our office and allow 72 hours.    Due to Covid, you will need to wear a mask upon entering the hospital. If you do not have a mask, a mask will be given to you at the Main Entrance upon arrival. For doctor visits, patients may have 1 support person with them. For treatment visits, patients can not have anyone with them due to social distancing guidelines and our immunocompromised population.      

## 2019-08-24 ENCOUNTER — Telehealth: Payer: Self-pay | Admitting: Diagnostic Neuroimaging

## 2019-08-24 ENCOUNTER — Encounter: Payer: Self-pay | Admitting: Diagnostic Neuroimaging

## 2019-08-24 NOTE — Telephone Encounter (Signed)
Called son, Darlene Moore who stated she's taking seizure medicines as prescribed. Her sister passed way, and she had seizure Sunday at a restaurant,  the day after the funeral.  It lasted a few minutes, and then she was fine, continued to eat her meal. Later she slept 2-3 hours. She is still staying with her son and his wife. Darlene Moore wants to know if she needs testing or a change in medications. She lives alone. I advised will discuss with Dr Leta Baptist and call him back. Darlene Moore  verbalized understanding, appreciation.

## 2019-08-24 NOTE — Telephone Encounter (Signed)
May increase vimpat to 100mg  twice a day. -VRP

## 2019-08-24 NOTE — Telephone Encounter (Signed)
Darlene Moore [facility]) called in and stated that pt had a large seizure yesterday and it last for about 3-4 min and it was different than other. He is wanting a call back to discuss  CB# [phone] or [phone]

## 2019-08-25 MED ORDER — LACOSAMIDE 100 MG PO TABS: 100.0000 mg | ORAL_TABLET | Freq: Two times a day (BID) | ORAL | 5 refills | Status: DC

## 2019-08-25 NOTE — Telephone Encounter (Signed)
Called Darlene Moore and advised him that Dr Leta Baptist advised she increase vimpat to 100 mg twice daily. He asked multiple questions regarding adding additional medication without testing, stated medical person he knows told him they've never known anyone whose seizures weren't controlled on levetiracetam alone, concern over her living alone. We discussed all his concerns to his satisfaction. I advised they call prior to 6 month follow up with any questions, concerns. He asked for new Rx to be sent to [facility] [place] so she won't run out of Vimpat.  I advised will let Dr Leta Baptist know. He verbalized understanding, appreciation.

## 2019-09-07 ENCOUNTER — Other Ambulatory Visit (HOSPITAL_COMMUNITY): Payer: Self-pay | Admitting: *Deleted

## 2019-09-07 DIAGNOSIS — Z170 Estrogen receptor positive status [ER+]: Secondary | ICD-10-CM

## 2019-09-07 MED ORDER — ANASTROZOLE 1 MG PO TABS: ORAL_TABLET | ORAL | 5 refills | Status: DC

## 2019-09-08 DIAGNOSIS — C50212 Malignant neoplasm of upper-inner quadrant of left female breast: Secondary | ICD-10-CM | POA: Diagnosis not present

## 2019-09-08 DIAGNOSIS — Z0001 Encounter for general adult medical examination with abnormal findings: Secondary | ICD-10-CM | POA: Diagnosis not present

## 2019-09-08 DIAGNOSIS — F411 Generalized anxiety disorder: Secondary | ICD-10-CM | POA: Diagnosis not present

## 2019-09-08 DIAGNOSIS — G4089 Other seizures: Secondary | ICD-10-CM | POA: Diagnosis not present

## 2019-09-08 DIAGNOSIS — D696 Thrombocytopenia, unspecified: Secondary | ICD-10-CM | POA: Diagnosis not present

## 2019-09-08 DIAGNOSIS — M25561 Pain in right knee: Secondary | ICD-10-CM | POA: Diagnosis not present

## 2019-09-08 DIAGNOSIS — K5900 Constipation, unspecified: Secondary | ICD-10-CM | POA: Diagnosis not present

## 2019-09-08 DIAGNOSIS — E782 Mixed hyperlipidemia: Secondary | ICD-10-CM | POA: Diagnosis not present

## 2019-09-08 DIAGNOSIS — M8580 Other specified disorders of bone density and structure, unspecified site: Secondary | ICD-10-CM | POA: Diagnosis not present

## 2019-09-15 ENCOUNTER — Other Ambulatory Visit (HOSPITAL_COMMUNITY): Payer: Self-pay | Admitting: *Deleted

## 2019-09-15 DIAGNOSIS — C50212 Malignant neoplasm of upper-inner quadrant of left female breast: Secondary | ICD-10-CM

## 2019-09-15 MED ORDER — ANASTROZOLE 1 MG PO TABS: ORAL_TABLET | ORAL | 5 refills | Status: DC

## 2019-09-23 DIAGNOSIS — Z170 Estrogen receptor positive status [ER+]: Secondary | ICD-10-CM | POA: Diagnosis not present

## 2019-09-23 DIAGNOSIS — C50412 Malignant neoplasm of upper-outer quadrant of left female breast: Secondary | ICD-10-CM | POA: Diagnosis not present

## 2019-10-27 DIAGNOSIS — K5900 Constipation, unspecified: Secondary | ICD-10-CM | POA: Diagnosis not present

## 2019-10-27 DIAGNOSIS — D696 Thrombocytopenia, unspecified: Secondary | ICD-10-CM | POA: Diagnosis not present

## 2019-10-27 DIAGNOSIS — C50212 Malignant neoplasm of upper-inner quadrant of left female breast: Secondary | ICD-10-CM | POA: Diagnosis not present

## 2019-10-27 DIAGNOSIS — M25561 Pain in right knee: Secondary | ICD-10-CM | POA: Diagnosis not present

## 2019-10-27 DIAGNOSIS — R7301 Impaired fasting glucose: Secondary | ICD-10-CM | POA: Diagnosis not present

## 2019-10-27 DIAGNOSIS — E782 Mixed hyperlipidemia: Secondary | ICD-10-CM | POA: Diagnosis not present

## 2019-10-27 DIAGNOSIS — F411 Generalized anxiety disorder: Secondary | ICD-10-CM | POA: Diagnosis not present

## 2019-10-27 DIAGNOSIS — M25562 Pain in left knee: Secondary | ICD-10-CM | POA: Diagnosis not present

## 2019-10-27 DIAGNOSIS — Z0000 Encounter for general adult medical examination without abnormal findings: Secondary | ICD-10-CM | POA: Diagnosis not present

## 2019-10-27 DIAGNOSIS — Z0001 Encounter for general adult medical examination with abnormal findings: Secondary | ICD-10-CM | POA: Diagnosis not present

## 2019-10-27 DIAGNOSIS — E7849 Other hyperlipidemia: Secondary | ICD-10-CM | POA: Diagnosis not present

## 2019-10-27 DIAGNOSIS — Z8669 Personal history of other diseases of the nervous system and sense organs: Secondary | ICD-10-CM | POA: Diagnosis not present

## 2019-10-27 DIAGNOSIS — G4089 Other seizures: Secondary | ICD-10-CM | POA: Diagnosis not present

## 2019-12-03 ENCOUNTER — Inpatient Hospital Stay (HOSPITAL_COMMUNITY): Payer: Medicare HMO | Attending: Hematology

## 2019-12-03 ENCOUNTER — Other Ambulatory Visit: Payer: Self-pay

## 2019-12-03 DIAGNOSIS — Z79899 Other long term (current) drug therapy: Secondary | ICD-10-CM | POA: Insufficient documentation

## 2019-12-03 DIAGNOSIS — Z170 Estrogen receptor positive status [ER+]: Secondary | ICD-10-CM | POA: Insufficient documentation

## 2019-12-03 DIAGNOSIS — Z79811 Long term (current) use of aromatase inhibitors: Secondary | ICD-10-CM | POA: Insufficient documentation

## 2019-12-03 DIAGNOSIS — G40909 Epilepsy, unspecified, not intractable, without status epilepticus: Secondary | ICD-10-CM | POA: Diagnosis not present

## 2019-12-03 DIAGNOSIS — M8580 Other specified disorders of bone density and structure, unspecified site: Secondary | ICD-10-CM | POA: Insufficient documentation

## 2019-12-03 DIAGNOSIS — C50212 Malignant neoplasm of upper-inner quadrant of left female breast: Secondary | ICD-10-CM | POA: Diagnosis not present

## 2019-12-03 LAB — COMPREHENSIVE METABOLIC PANEL
ALT: 20 U/L (ref 0–44)
AST: 22 U/L (ref 15–41)
Albumin: 4 g/dL (ref 3.5–5.0)
Alkaline Phosphatase: 101 U/L (ref 38–126)
Anion gap: 10 (ref 5–15)
BUN: 26 mg/dL — ABNORMAL HIGH (ref 8–23)
CO2: 26 mmol/L (ref 22–32)
Calcium: 9.4 mg/dL (ref 8.9–10.3)
Chloride: 102 mmol/L (ref 98–111)
Creatinine, Ser: 0.71 mg/dL (ref 0.44–1.00)
GFR calc Af Amer: >60 mL/min (ref >60)
GFR calc non Af Amer: >60 mL/min (ref >60)
Glucose, Bld: 99 mg/dL (ref 70–99)
Potassium: 4.1 mmol/L (ref 3.5–5.1)
Sodium: 138 mmol/L (ref 135–145)
Total Bilirubin: 0.6 mg/dL (ref 0.3–1.2)
Total Protein: 7.6 g/dL (ref 6.5–8.1)

## 2019-12-03 LAB — CBC WITH DIFFERENTIAL/PLATELET
Abs Immature Granulocytes: 0.01 10*3/uL (ref 0.00–0.07)
Basophils Absolute: 0.1 10*3/uL (ref 0.0–0.1)
Basophils Relative: 1 %
Eosinophils Absolute: 0.1 10*3/uL (ref 0.0–0.5)
Eosinophils Relative: 2 %
HCT: 41.4 % (ref 36.0–46.0)
Hemoglobin: 13.3 g/dL (ref 12.0–15.0)
Immature Granulocytes: 0 %
Lymphocytes Relative: 20 %
Lymphs Abs: 1 10*3/uL (ref 0.7–4.0)
MCH: 30.2 pg (ref 26.0–34.0)
MCHC: 32.1 g/dL (ref 30.0–36.0)
MCV: 93.9 fL (ref 80.0–100.0)
Monocytes Absolute: 0.4 10*3/uL (ref 0.1–1.0)
Monocytes Relative: 8 %
Neutro Abs: 3.6 10*3/uL (ref 1.7–7.7)
Neutrophils Relative %: 69 %
Platelets: 124 10*3/uL — ABNORMAL LOW (ref 150–400)
RBC: 4.41 MIL/uL (ref 3.87–5.11)
RDW: 11.7 % (ref 11.5–15.5)
WBC: 5.2 10*3/uL (ref 4.0–10.5)
nRBC: 0 % (ref 0.0–0.2)

## 2019-12-03 LAB — LACTATE DEHYDROGENASE: LDH: 200 U/L — ABNORMAL HIGH (ref 98–192)

## 2019-12-03 LAB — VITAMIN D 25 HYDROXY (VIT D DEFICIENCY, FRACTURES): Vit D, 25-Hydroxy: 57.32 ng/mL (ref 30–100)

## 2019-12-03 LAB — FOLATE: Folate: 45.5 ng/mL (ref >5.9)

## 2019-12-03 LAB — VITAMIN B12: Vitamin B-12: 525 pg/mL (ref 180–914)

## 2019-12-05 LAB — COPPER, SERUM: Copper: 173 ug/dL — ABNORMAL HIGH (ref 80–158)

## 2019-12-07 ENCOUNTER — Other Ambulatory Visit: Payer: Self-pay

## 2019-12-07 ENCOUNTER — Ambulatory Visit: Payer: Medicare HMO | Admitting: Family Medicine

## 2019-12-07 ENCOUNTER — Encounter: Payer: Self-pay | Admitting: Family Medicine

## 2019-12-07 VITALS — BP 125/57 | HR 54 | Temp 98.6°F | Ht 66.0 in | Wt 210.4 lb

## 2019-12-07 DIAGNOSIS — G40109 Localization-related (focal) (partial) symptomatic epilepsy and epileptic syndromes with simple partial seizures, not intractable, without status epilepticus: Secondary | ICD-10-CM | POA: Diagnosis not present

## 2019-12-07 MED ORDER — LACOSAMIDE 150 MG PO TABS: 150.0000 mg | ORAL_TABLET | Freq: Two times a day (BID) | ORAL | 5 refills | Status: DC

## 2019-12-07 MED ORDER — LEVETIRACETAM ER 500 MG PO TB24: ORAL_TABLET | ORAL | 3 refills | Status: DC

## 2019-12-07 NOTE — Patient Instructions (Addendum)
Continue levetiracetam 1500mg  twice daily  We will increase lacosamide 150mg  twice daily  Stay well hydrated. Follow up closely with PCP and oncology  We will follow up in 6 months, sooner if needed    According to [place] law, you can not drive unless you are seizure / syncope free for at least 6 months and under physician's care.  Please maintain precautions. Do not participate in activities where a loss of awareness could harm you or someone else. No swimming alone, no tub bathing, no hot tubs, no driving, no operating motorized vehicles (cars, ATVs, motocycles, etc), lawnmowers, power tools or firearms. No standing at heights, such as rooftops, ladders or stairs. Avoid hot objects such as stoves, heaters, open fires. Wear a helmet when riding a bicycle, scooter, skateboard, etc. and avoid areas of traffic. Set your water heater to 120 degrees or less.     Seizure, Adult A seizure is a sudden burst of abnormal electrical activity in the brain. Seizures usually last from 30 seconds to 2 minutes. They can cause many different symptoms. Usually, seizures are not harmful unless they last a long time. What are the causes? Common causes of this condition include:  Fever or infection.  Conditions that affect the brain, such as: ? A brain abnormality that you were born with. ? A brain or head injury. ? Bleeding in the brain. ? A tumor. ? Stroke. ? Brain disorders such as autism or cerebral palsy.  Low blood sugar.  Conditions that are passed from parent to child (are inherited).  Problems with substances, such as: ? Having a reaction to a drug or a medicine. ? Suddenly stopping the use of a substance (withdrawal). In some cases, the cause may not be known. A person who has repeated seizures over time without a clear cause has a condition called epilepsy. What increases the risk? You are more likely to get this condition if you have:  A family history of epilepsy.  Had a seizure in  the past.  A brain disorder.  A history of head injury, lack of oxygen at birth, or strokes. What are the signs or symptoms? There are many types of seizures. The symptoms vary depending on the type of seizure you have. Examples of symptoms during a seizure include:  Shaking (convulsions).  Stiffness in the body.  Passing out (losing consciousness).  Head nodding.  Staring.  Not responding to sound or touch.  Loss of bladder control and bowel control. Some people have symptoms right before and right after a seizure happens. Symptoms before a seizure may include:  Fear.  Worry (anxiety).  Feeling like you may vomit (nauseous).  Feeling like the room is spinning (vertigo).  Feeling like you saw or heard something before (dj vu).  Odd tastes or smells.  Changes in how you see. You may see flashing lights or spots. Symptoms after a seizure happens can include:  Confusion.  Sleepiness.  Headache.  Weakness on one side of the body. How is this treated? Most seizures will stop on their own in under 5 minutes. In these cases, no treatment is needed. Seizures that last longer than 5 minutes will usually need treatment. Treatment can include:  Medicines given through an IV tube.  Avoiding things that are known to cause your seizures. These can include medicines that you take for another condition.  Medicines to treat epilepsy.  Surgery to stop the seizures. This may be needed if medicines do not help. Follow these instructions at  home: Medicines  Take over-the-counter and prescription medicines only as told by your doctor.  Do not eat or drink anything that may keep your medicine from working, such as alcohol. Activity  Do not do any activities that would be dangerous if you had another seizure, like driving or swimming. Wait until your doctor says it is safe for you to do them.  If you live in the [place] ask your local DMV (department of motor vehicles) when  you can drive.  Get plenty of rest. Teaching others Teach friends and family what to do when you have a seizure. They should:  Lay you on the ground.  Protect your head and body.  Loosen any tight clothing around your neck.  Turn you on your side.  Not hold you down.  Not put anything into your mouth.  Know whether or not you need emergency care.  Stay with you until you are better.  General instructions  Contact your doctor each time you have a seizure.  Avoid anything that gives you seizures.  Keep a seizure diary. Write down: ? What you think caused each seizure. ? What you remember about each seizure.  Keep all follow-up visits as told by your doctor. This is important. Contact a doctor if:  You have another seizure.  You have seizures more often.  There is any change in what happens during your seizures.  You keep having seizures with treatment.  You have symptoms of being sick or having an infection. Get help right away if:  You have a seizure that: ? Lasts longer than 5 minutes. ? Is different than seizures you had before. ? Makes it harder to breathe. ? Happens after you hurt your head.  You have any of these symptoms after a seizure: ? Not being able to speak. ? Not being able to use a part of your body. ? Confusion. ? A bad headache.  You have two or more seizures in a row.  You do not wake up right after a seizure.  You get hurt during a seizure. These symptoms may be an emergency. Do not wait to see if the symptoms will go away. Get medical help right away. Call your local emergency services (911 in the [place] Do not drive yourself to the hospital. Summary  Seizures usually last from 30 seconds to 2 minutes. Usually, they are not harmful unless they last a long time.  Do not eat or drink anything that may keep your medicine from working, such as alcohol.  Teach friends and family what to do when you have a seizure.  Contact your  doctor each time you have a seizure. This information is not intended to replace advice given to you by your health care provider. Make sure you discuss any questions you have with your health care provider. Document Revised: 11/28/2018 Document Reviewed: 11/28/2018 Elsevier Patient Education  [place].
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
## 2019-12-08 LAB — METHYLMALONIC ACID, SERUM: Methylmalonic Acid, Quantitative: 135 nmol/L (ref 0–378)

## 2019-12-10 ENCOUNTER — Inpatient Hospital Stay (HOSPITAL_BASED_OUTPATIENT_CLINIC_OR_DEPARTMENT_OTHER): Payer: Medicare HMO | Admitting: Nurse Practitioner

## 2019-12-10 ENCOUNTER — Other Ambulatory Visit: Payer: Self-pay

## 2019-12-10 ENCOUNTER — Encounter (HOSPITAL_COMMUNITY): Payer: Self-pay | Admitting: Nurse Practitioner

## 2019-12-10 DIAGNOSIS — Z170 Estrogen receptor positive status [ER+]: Secondary | ICD-10-CM

## 2019-12-10 DIAGNOSIS — Z79811 Long term (current) use of aromatase inhibitors: Secondary | ICD-10-CM | POA: Diagnosis not present

## 2019-12-10 DIAGNOSIS — G40909 Epilepsy, unspecified, not intractable, without status epilepticus: Secondary | ICD-10-CM | POA: Diagnosis not present

## 2019-12-10 DIAGNOSIS — Z79899 Other long term (current) drug therapy: Secondary | ICD-10-CM | POA: Diagnosis not present

## 2019-12-10 DIAGNOSIS — C50212 Malignant neoplasm of upper-inner quadrant of left female breast: Secondary | ICD-10-CM

## 2019-12-10 DIAGNOSIS — M8580 Other specified disorders of bone density and structure, unspecified site: Secondary | ICD-10-CM | POA: Diagnosis not present

## 2019-12-10 NOTE — Progress Notes (Signed)
Darlene Moore, [place] [address]   CLINIC:  Medical Oncology/Hematology  PCP:  Darlene Squibb, MD [place] [address] [phone]   REASON FOR VISIT: Follow-up for breast cancer  CURRENT THERAPY: Anastrozole  BRIEF ONCOLOGIC HISTORY:  Oncology History  Breast cancer of upper-inner quadrant of left female breast ([place])  09/10/2018 Initial Diagnosis   Breast cancer of upper-inner quadrant of left female breast ([place])   11/14/2018 Genetic Testing   Negative genetic testing on the multi-cancer panel.  The Multi-Gene Panel offered by [facility] includes sequencing and/or deletion duplication testing of the following 84 genes: AIP, ALK, APC, ATM, AXIN2,BAP1,  BARD1, BLM, BMPR1A, BRCA1, BRCA2, BRIP1, CASR, CDC73, CDH1, CDK4, CDKN1B, CDKN1C, CDKN2A (p14ARF), CDKN2A (p16INK4a), CEBPA, CHEK2, CTNNA1, DICER1, DIS3L2, EGFR (c.2369C>T, p.Thr790Met variant only), EPCAM (Deletion/duplication testing only), FH, FLCN, GATA2, GPC3, GREM1 (Promoter region deletion/duplication testing only), HOXB13 (c.251G>A, p.Gly84Glu), HRAS, KIT, MAX, MEN1, MET, MITF (c.952G>A, p.Glu318Lys variant only), MLH1, MSH2, MSH3, MSH6, MUTYH, NBN, NF1, NF2, NTHL1, PALB2, PDGFRA, PHOX2B, PMS2, POLD1, POLE, POT1, PRKAR1A, PTCH1, PTEN, RAD50, RAD51C, RAD51D, RB1, RECQL4, RET, RUNX1, SDHAF2, SDHA (sequence changes only), SDHB, SDHC, SDHD, SMAD4, SMARCA4, SMARCB1, SMARCE1, STK11, SUFU, TERC, TERT, TMEM127, TP53, TSC1, TSC2, VHL, WRN and WT1.  The report date is November 14, 2018.      INTERVAL HISTORY:  Darlene Moore 74 y.o. female returns for routine follow-up for breast cancer.  Patient reports she is taking her medication as prescribed.  She has no side effects from the medication.  She denies any new lumps or bumps present.  She denies any new bone pain. Denies any nausea, vomiting, or diarrhea. Denies any new pains. Had not noticed any recent bleeding such as epistaxis, hematuria  or hematochezia. Denies recent chest pain on exertion, shortness of breath on minimal exertion, pre-syncopal episodes, or palpitations. Denies any numbness or tingling in hands or feet. Denies any recent fevers, infections, or recent hospitalizations. Patient reports appetite at 100% and energy level at 50%.  She is eating well maintain her weight at this time.     REVIEW OF SYSTEMS:  Review of Systems  Gastrointestinal: Positive for constipation.  Neurological: Positive for dizziness and seizures (Last seizure on 12/07/2019).  Psychiatric/Behavioral: The patient is nervous/anxious.   All other systems reviewed and are negative.    PAST MEDICAL/SURGICAL HISTORY:  Past Medical History:  Diagnosis Date  . Concussion   . Family history of breast cancer   . Family history of kidney cancer   . Family history of ovarian cancer   . Family history of stomach cancer   . Family history of uterine cancer   . High cholesterol   . Medical history non-contributory   . Seizures ([place])    last seizure 05/27/19   Past Surgical History:  Procedure Laterality Date  . CHOLECYSTECTOMY    . COLONOSCOPY    . COLONOSCOPY N/A 09/29/2015   Procedure: COLONOSCOPY;  Surgeon: Rogene Houston, MD;  Location: [facility];  Service: Endoscopy;  Laterality: N/A;  730 - moved to 9:00 - Ann to notify pt  . PARTIAL MASTECTOMY WITH NEEDLE LOCALIZATION AND AXILLARY SENTINEL LYMPH NODE BX Left 10/01/2018   Procedure: LEFT BREAST PARTIAL MASTECTOMY WITH SENTINEL LYMPH NODE BIOPSY AFTER NEEDLE LOCALIZATION AND RADIOTRACER PLACEMENT;  Surgeon: Virl Cagey, MD;  Location: [facility] ORS;  Service: General;  Laterality: Left;     SOCIAL HISTORY:  Social History   Socioeconomic History  .  Marital status: Widowed    Spouse name: Not on file  . Number of children: 1  . Years of education: 26  . Highest education level: Not on file  Occupational History  . Not on file  Tobacco Use  . Smoking status: Never Smoker  .  Smokeless tobacco: Never Used  Substance and Sexual Activity  . Alcohol use: No  . Drug use: No  . Sexual activity: Not on file  Other Topics Concern  . Not on file  Social History Narrative   07/2019  Lives home alone. Widow.   Retired/ not working.   Education 12th grade.  Children 1.  Drinks decaff coffee, tea.    Social Determinants of Health   Financial Resource Strain:   . Difficulty of Paying Living Expenses:   Food Insecurity:   . Worried About Charity fundraiser in the Last Year:   . Arboriculturist in the Last Year:   Transportation Needs:   . Film/video editor (Medical):   Marland Kitchen Lack of Transportation (Non-Medical):   Physical Activity:   . Days of Exercise per Week:   . Minutes of Exercise per Session:   Stress:   . Feeling of Stress :   Social Connections:   . Frequency of Communication with Friends and Family:   . Frequency of Social Gatherings with Friends and Family:   . Attends Religious Services:   . Active Member of Clubs or Organizations:   . Attends Archivist Meetings:   Marland Kitchen Marital Status:   Intimate Partner Violence:   . Fear of Current or Ex-Partner:   . Emotionally Abused:   Marland Kitchen Physically Abused:   . Sexually Abused:     FAMILY HISTORY:  Family History  Problem Relation Age of Onset  . Breast cancer Sister 36       d. 70  . Liver cancer Sister   . Breast cancer Sister 34       double mastectomy  . Diabetes Mother   . Heart disease Father        d. 5  . Seizures Other        maternal  . Kidney cancer Maternal Aunt   . Kidney cancer Maternal Uncle   . Brain cancer Paternal Aunt   . Uterine cancer Maternal Grandmother   . Leukemia Paternal Grandmother   . Ovarian cancer Niece 25  . Uterine cancer Niece 61  . Stomach cancer Niece 72  . Stomach cancer Maternal Uncle   . Kidney cancer Paternal Aunt   . Kidney cancer Paternal Aunt   . Bladder Cancer Paternal Aunt   . Skin cancer Paternal Aunt        on ankle; d. 99     CURRENT MEDICATIONS:  Outpatient Encounter Medications as of 12/10/2019  Medication Sig  . Ibuprofen-diphenhydrAMINE Cit (ADVIL PM PO) Take 1 tablet by mouth at bedtime.  Marland Kitchen anastrozole (ARIMIDEX) 1 MG tablet TAKE 1 TABLET(1 MG) BY MOUTH DAILY  . Calcium Carbonate (CALCIUM 600 PO) Take by mouth 2 (two) times daily.  Marland Kitchen ibuprofen (ADVIL,MOTRIN) 200 MG tablet Take 400 mg by mouth every 8 (eight) hours as needed for mild pain or moderate pain.   . Lacosamide 150 MG TABS Take 1 tablet (150 mg total) by mouth in the morning and at bedtime.  . levETIRAcetam (KEPPRA XR) 500 MG 24 hr tablet TAKE 3 TABLETS TWICE DAILY  . Multiple Vitamin (MULTIVITAMIN) tablet Take 1 tablet by mouth daily.  Marland Kitchen  Probiotic Product (PROBIOTIC PO) Take 2 Doses by mouth daily. Natures made brand  . sertraline (ZOLOFT) 25 MG tablet Take 25 mg by mouth daily.   Marland Kitchen VITAMIN D PO Take 1,000 Units by mouth.   No facility-administered encounter medications on file as of 12/10/2019.    ALLERGIES:  No Known Allergies   PHYSICAL EXAM:  ECOG Performance status: 1  Vitals:   12/10/19 1410  BP: 109/60  Pulse: 60  Resp: 16  Temp: (!) 96.9 F (36.1 C)  SpO2: 100%   Filed Weights   12/10/19 1410  Weight: 210 lb 6.4 oz (95.4 kg)    Physical Exam Constitutional:      Appearance: Normal appearance. She is normal weight.  Cardiovascular:     Rate and Rhythm: Normal rate and regular rhythm.     Heart sounds: Normal heart sounds.  Pulmonary:     Effort: Pulmonary effort is normal.     Breath sounds: Normal breath sounds.  Abdominal:     General: Bowel sounds are normal.     Palpations: Abdomen is soft.  Musculoskeletal:        General: Normal range of motion.  Skin:    General: Skin is warm.  Neurological:     Mental Status: She is alert and oriented to person, place, and time. Mental status is at baseline.  Psychiatric:        Mood and Affect: Mood normal.        Behavior: Behavior normal.        Thought  Content: Thought content normal.        Judgment: Judgment normal.      LABORATORY DATA:  I have reviewed the labs as listed.  CBC    Component Value Date/Time   WBC 5.2 12/03/2019 1039   RBC 4.41 12/03/2019 1039   HGB 13.3 12/03/2019 1039   HCT 41.4 12/03/2019 1039   PLT 124 (L) 12/03/2019 1039   MCV 93.9 12/03/2019 1039   MCH 30.2 12/03/2019 1039   MCHC 32.1 12/03/2019 1039   RDW 11.7 12/03/2019 1039   LYMPHSABS 1.0 12/03/2019 1039   MONOABS 0.4 12/03/2019 1039   EOSABS 0.1 12/03/2019 1039   BASOSABS 0.1 12/03/2019 1039   CMP Latest Ref Rng & Units 12/03/2019 08/11/2019 03/31/2019  Glucose 70 - 99 mg/dL 99 101(H) 119(H)  BUN 8 - 23 mg/dL 26(H) 18 23  Creatinine 0.44 - 1.00 mg/dL 0.71 0.78 0.83  Sodium 135 - 145 mmol/L 138 140 141  Potassium 3.5 - 5.1 mmol/L 4.1 4.1 4.7  Chloride 98 - 111 mmol/L 102 104 105  CO2 22 - 32 mmol/L '26 25 27  ' Calcium 8.9 - 10.3 mg/dL 9.4 9.6 9.7  Total Protein 6.5 - 8.1 g/dL 7.6 7.4 6.9  Total Bilirubin 0.3 - 1.2 mg/dL 0.6 0.8 0.4  Alkaline Phos 38 - 126 U/L 101 87 83  AST 15 - 41 U/L '22 22 21  ' ALT 0 - 44 U/L '20 18 17     ' I personally performed a face-to-face visit,   All questions were answered to patient's stated satisfaction. Encouraged patient to call with any new concerns or questions before his next visit to the cancer center and we can certain see him sooner, if needed.     ASSESSMENT & PLAN:   Breast cancer of upper-inner quadrant of left female breast ([place]) 1. Stage I (PT1cPN0) left breast invasive ductal carcinoma (IDC): -Screening mammogram on 08/07/2018 once BI-RADS Category 0. - She had left  breast mammogram on 08/26/2018 with additional views, and ultrasound showing 0.9 x 0.9 x 0.7 cm mass at 10 o'clock position.  She also had an ultrasound of the left axilla at that time which was negative. - Breast biopsy was done on 09/02/2018.  This showed IDC, grade 1, DCIS intermediate grade, ER/PR positive, her-2 negative. -She  underwent left breast lumpectomy and sentinel lymph node biopsy on 10/01/2018. - Dr. Delton Coombes had a prolonged discussion about her pathology report.  It shows 1.6 cm IDC, grade 1.  The deep margins were positive even though it was resected to the pectoralis muscle.  0/1 lymph nodes was involved.  ER was 100% positive, PR 20% and HER-2/neu negative.  Ki 67 was 2%. - Because of the positive deep margins, Dr. Delton Coombes recommended radiation therapy.  She was also scheduled for another mammogram as they could not trace the radioactive seed. - She was referred to Dr. Francesca Jewett for radiation. Looks like she was seen by him in June 2020 and has another follow up in December 2020.  -She was started on antiestrogen therapy in 10/2018 with anastrozole 1 mg daily she will take this for 5 years. -She is tolerating the anastrozole well with no side effects at this time. -She is getting the recommended daily exercise. -Mammogram was done on 08/11/2019 and was B RADS category 2 benign -Labs on 12/03/2019 showed creatinine 0.71, LDH 200, WBC 5.2, hemoglobin 13.3, platelets 124. -We will see her back in 4 months with repeat labs  2.osteopenia: - DEXA scan on 07/18/2015 showed T score of -1.7. - DEXA scan on 11/06/2018 showed a T score of -2.1.  Results are reviewed with the patient on 12/10/2018. - She stopped her calcium due to constipation.  She was reeducated on the importance of it and that she may add a stool softener with that. - She was educated on Prolia to prevent osteoporosis.  However she wanted to start taking her calcium twice a day and wait on that treatment. -We will repeat a DEXA scan in 2 years.  3.seizure disorder: -She will continue Keppra 1500 mg p.o. twice daily. -Her last seizure was on 12/07/2019.  No hospitalization required.  Neurologist aware.        Orders placed this encounter:  Orders Placed This Encounter  Procedures  . Lactate dehydrogenase  . VITAMIN D 25 Hydroxy (Vit-D  Deficiency, Fractures)  . Vitamin B12  . CBC with Differential/Platelet  . Comprehensive metabolic panel      Francene Finders, FNP-C [place] [phone]

## 2019-12-10 NOTE — Patient Instructions (Addendum)
[facility] at [facility]  Discharge Instructions: Follow up in 4 months with labs   You saw Randi Lockamy, NP, today. _______________________________________________________________  Thank you for choosing [facility] at [facility] to provide your oncology and hematology care.  To afford each patient quality time with our providers, please arrive at least 15 minutes before your scheduled appointment.  You need to re-schedule your appointment if you arrive 10 or more minutes late.  We strive to give you quality time with our providers, and arriving late affects you and other patients whose appointments are after yours.  Also, if you no show three or more times for appointments you may be dismissed from the clinic.  Again, thank you for choosing [facility] at [facility]. Our hope is that these requests will allow you access to exceptional care and in a timely manner. _______________________________________________________________  If you have questions after your visit, please contact our office at [phone] between the hours of 8:30 a.m. and 5:00 p.m. Voicemails left after 4:30 p.m. will not be returned until the following business day. _______________________________________________________________  For prescription refill requests, have your pharmacy contact our office. _______________________________________________________________  Recommendations made by the consultant and any test results will be sent to your referring physician. _______________________________________________________________ 

## 2019-12-10 NOTE — Assessment & Plan Note (Signed)
1. Stage I (PT1cPN0) left breast invasive ductal carcinoma (IDC): -Screening mammogram on 08/07/2018 once BI-RADS Category 0. - She had left breast mammogram on 08/26/2018 with additional views, and ultrasound showing 0.9 x 0.9 x 0.7 cm mass at 10 o'clock position.  She also had an ultrasound of the left axilla at that time which was negative. - Breast biopsy was done on 09/02/2018.  This showed IDC, grade 1, DCIS intermediate grade, ER/PR positive, her-2 negative. -She underwent left breast lumpectomy and sentinel lymph node biopsy on 10/01/2018. - Dr. Delton Coombes had a prolonged discussion about her pathology report.  It shows 1.6 cm IDC, grade 1.  The deep margins were positive even though it was resected to the pectoralis muscle.  0/1 lymph nodes was involved.  ER was 100% positive, PR 20% and HER-2/neu negative.  Ki 67 was 2%. - Because of the positive deep margins, Dr. Delton Coombes recommended radiation therapy.  She was also scheduled for another mammogram as they could not trace the radioactive seed. - She was referred to Dr. Francesca Jewett for radiation. Looks like she was seen by him in June 2020 and has another follow up in December 2020.  -She was started on antiestrogen therapy in 10/2018 with anastrozole 1 mg daily she will take this for 5 years. -She is tolerating the anastrozole well with no side effects at this time. -She is getting the recommended daily exercise. -Mammogram was done on 08/11/2019 and was B RADS category 2 benign -Labs on 12/03/2019 showed creatinine 0.71, LDH 200, WBC 5.2, hemoglobin 13.3, platelets 124. -We will see her back in 4 months with repeat labs  2.osteopenia: - DEXA scan on 07/18/2015 showed T score of -1.7. - DEXA scan on 11/06/2018 showed a T score of -2.1.  Results are reviewed with the patient on 12/10/2018. - She stopped her calcium due to constipation.  She was reeducated on the importance of it and that she may add a stool softener with that. - She was  educated on Prolia to prevent osteoporosis.  However she wanted to start taking her calcium twice a day and wait on that treatment. -We will repeat a DEXA scan in 2 years.  3.seizure disorder: -She will continue Keppra 1500 mg p.o. twice daily. -Her last seizure was on 12/07/2019.  No hospitalization required.  Neurologist aware.

## 2019-12-10 NOTE — Progress Notes (Signed)
Pt is taking Arimidex as prescribed with no side effects. 

## 2019-12-12 LAB — LACOSAMIDE: Lacosamide: 6.3 ug/mL (ref 5.0–10.0)

## 2019-12-12 LAB — LEVETIRACETAM LEVEL: Levetiracetam Lvl: 59 ug/mL — ABNORMAL HIGH (ref 10.0–40.0)

## 2019-12-14 ENCOUNTER — Telehealth: Payer: Self-pay

## 2019-12-14 NOTE — Telephone Encounter (Signed)
Pt called and I relayed per AL/NP note to her that everything looked good.  Both levetiracetam and lacosamide were good. These medications are rarely toxic but we wanted to be sure the medications were being absorbed since she had a couple of questionable seizures. Everything looked good and we will continue current therapy. Pt verbalized understanding., just checking.

## 2019-12-14 NOTE — Telephone Encounter (Signed)
Patient left a voicemail at 8:50a, asking for a call back to go over lab results.

## 2019-12-14 NOTE — Telephone Encounter (Signed)
Hey Darlene Moore. Both levetiracetam and lacosamide were good. These medications are rarely toxic but we wanted to be sure the medications were being absorbed since she had a couple of questionable seizures. Everything looked good and we will continue current therapy.

## 2019-12-21 NOTE — Progress Notes (Signed)
I reviewed note and agree with plan.   Penni Bombard, MD [id number], [id number] PM Certified in Neurology, Neurophysiology and Neuroimaging  [facility] [address], [place], [place] [address] [phone]

## 2020-01-04 ENCOUNTER — Other Ambulatory Visit: Payer: Self-pay | Admitting: *Deleted

## 2020-01-04 MED ORDER — LEVETIRACETAM ER 500 MG PO TB24: ORAL_TABLET | ORAL | 3 refills | Status: DC

## 2020-03-16 ENCOUNTER — Other Ambulatory Visit (HOSPITAL_COMMUNITY): Payer: Self-pay | Admitting: Nurse Practitioner

## 2020-03-16 DIAGNOSIS — Z170 Estrogen receptor positive status [ER+]: Secondary | ICD-10-CM

## 2020-03-17 ENCOUNTER — Other Ambulatory Visit (HOSPITAL_COMMUNITY): Payer: Self-pay | Admitting: *Deleted

## 2020-03-17 DIAGNOSIS — Z170 Estrogen receptor positive status [ER+]: Secondary | ICD-10-CM

## 2020-03-17 MED ORDER — ANASTROZOLE 1 MG PO TABS: ORAL_TABLET | ORAL | 4 refills | Status: DC

## 2020-03-21 ENCOUNTER — Telehealth: Payer: Self-pay | Admitting: Family Medicine

## 2020-03-21 ENCOUNTER — Other Ambulatory Visit (HOSPITAL_COMMUNITY): Payer: Self-pay | Admitting: *Deleted

## 2020-03-21 MED ORDER — LAMOTRIGINE 25 MG PO TABS: 50.0000 mg | ORAL_TABLET | Freq: Two times a day (BID) | ORAL | 5 refills | Status: DC

## 2020-03-21 NOTE — Telephone Encounter (Signed)
Please call to triage. Any specific triggers? Any missed doses of medications? What were specific symptoms? Confirm medications please. TY.

## 2020-03-21 NOTE — Telephone Encounter (Signed)
Patient called asking to speak to Amy, NP or RN. She states that she had a seizure on Friday and is wanting to discuss this. She states she will need to be called after 1PM because she will be out running errands.

## 2020-03-21 NOTE — Telephone Encounter (Signed)
I called pt and she had seizure on Friday, in the morning around 0930 outside, painting outdoor furniture. Talking to neighbor.  Neighbor went to take garbage can to road and when came back she was lying under the pine trees.  No recall of this.  Lasted maybe a minute.  No med changes, no recreational drugs, sleep pattern no change. No incontinence. No triggers that she is aware of.  Compliant with vimpat 150mg  po bid, keppra 1500mg  po bid.

## 2020-03-21 NOTE — Addendum Note (Signed)
Addended by: Debbora Presto L on: 03/21/2020 02:36 PM   Modules accepted: Orders

## 2020-03-21 NOTE — Telephone Encounter (Signed)
I spoke to pt and relayed the instructions seizure precautions, no driving. (as per below).  Start lamortigine 25mg  po bid for once week then 50gm po bid stay on until seen.  Made appt 03-29-20 at 1000 for evaluation. (AL/NP no availablity).

## 2020-03-22 NOTE — Telephone Encounter (Signed)
Voicemail message, 10:55am:  Pt did not leave much information, she just stated it was in regards to her appointment.

## 2020-03-22 NOTE — Telephone Encounter (Signed)
LMVM for pt that returned call. Has appt 04-18-20 at 1000. She will call back if needed. I see that she did change appt now.

## 2020-03-29 ENCOUNTER — Ambulatory Visit: Payer: Self-pay | Admitting: Diagnostic Neuroimaging

## 2020-04-13 ENCOUNTER — Other Ambulatory Visit (HOSPITAL_COMMUNITY): Payer: Self-pay | Admitting: Nurse Practitioner

## 2020-04-13 ENCOUNTER — Inpatient Hospital Stay (HOSPITAL_COMMUNITY): Payer: Medicare HMO | Attending: Nurse Practitioner | Admitting: Nurse Practitioner

## 2020-04-13 ENCOUNTER — Inpatient Hospital Stay (HOSPITAL_COMMUNITY): Payer: Medicare HMO

## 2020-04-13 ENCOUNTER — Other Ambulatory Visit: Payer: Self-pay

## 2020-04-13 VITALS — BP 108/55 | HR 55 | Temp 97.6°F | Resp 18 | Wt 196.7 lb

## 2020-04-13 DIAGNOSIS — C50212 Malignant neoplasm of upper-inner quadrant of left female breast: Secondary | ICD-10-CM | POA: Insufficient documentation

## 2020-04-13 DIAGNOSIS — Z803 Family history of malignant neoplasm of breast: Secondary | ICD-10-CM | POA: Insufficient documentation

## 2020-04-13 DIAGNOSIS — Z170 Estrogen receptor positive status [ER+]: Secondary | ICD-10-CM

## 2020-04-13 DIAGNOSIS — G40909 Epilepsy, unspecified, not intractable, without status epilepticus: Secondary | ICD-10-CM | POA: Diagnosis not present

## 2020-04-13 DIAGNOSIS — Z800 Family history of malignant neoplasm of digestive organs: Secondary | ICD-10-CM | POA: Insufficient documentation

## 2020-04-13 DIAGNOSIS — Z8349 Family history of other endocrine, nutritional and metabolic diseases: Secondary | ICD-10-CM | POA: Insufficient documentation

## 2020-04-13 DIAGNOSIS — Z8041 Family history of malignant neoplasm of ovary: Secondary | ICD-10-CM | POA: Diagnosis not present

## 2020-04-13 DIAGNOSIS — Z79899 Other long term (current) drug therapy: Secondary | ICD-10-CM | POA: Diagnosis not present

## 2020-04-13 DIAGNOSIS — M8580 Other specified disorders of bone density and structure, unspecified site: Secondary | ICD-10-CM | POA: Diagnosis not present

## 2020-04-13 DIAGNOSIS — Z8051 Family history of malignant neoplasm of kidney: Secondary | ICD-10-CM | POA: Insufficient documentation

## 2020-04-13 DIAGNOSIS — Z79811 Long term (current) use of aromatase inhibitors: Secondary | ICD-10-CM | POA: Insufficient documentation

## 2020-04-13 DIAGNOSIS — Z791 Long term (current) use of non-steroidal anti-inflammatories (NSAID): Secondary | ICD-10-CM | POA: Insufficient documentation

## 2020-04-13 DIAGNOSIS — K5900 Constipation, unspecified: Secondary | ICD-10-CM | POA: Diagnosis not present

## 2020-04-13 DIAGNOSIS — Z8249 Family history of ischemic heart disease and other diseases of the circulatory system: Secondary | ICD-10-CM | POA: Diagnosis not present

## 2020-04-13 LAB — COMPREHENSIVE METABOLIC PANEL
ALT: 14 U/L (ref 0–44)
AST: 19 U/L (ref 15–41)
Albumin: 4 g/dL (ref 3.5–5.0)
Alkaline Phosphatase: 83 U/L (ref 38–126)
Anion gap: 11 (ref 5–15)
BUN: 18 mg/dL (ref 8–23)
CO2: 27 mmol/L (ref 22–32)
Calcium: 10 mg/dL (ref 8.9–10.3)
Chloride: 104 mmol/L (ref 98–111)
Creatinine, Ser: 0.75 mg/dL (ref 0.44–1.00)
GFR calc Af Amer: >60 mL/min (ref >60)
GFR calc non Af Amer: >60 mL/min (ref >60)
Glucose, Bld: 103 mg/dL — ABNORMAL HIGH (ref 70–99)
Potassium: 5 mmol/L (ref 3.5–5.1)
Sodium: 142 mmol/L (ref 135–145)
Total Bilirubin: 0.5 mg/dL (ref 0.3–1.2)
Total Protein: 7.3 g/dL (ref 6.5–8.1)

## 2020-04-13 LAB — CBC WITH DIFFERENTIAL/PLATELET
Abs Immature Granulocytes: 0.01 10*3/uL (ref 0.00–0.07)
Basophils Absolute: 0 10*3/uL (ref 0.0–0.1)
Basophils Relative: 1 %
Eosinophils Absolute: 0.1 10*3/uL (ref 0.0–0.5)
Eosinophils Relative: 2 %
HCT: 40.9 % (ref 36.0–46.0)
Hemoglobin: 13.2 g/dL (ref 12.0–15.0)
Immature Granulocytes: 0 %
Lymphocytes Relative: 25 %
Lymphs Abs: 1 10*3/uL (ref 0.7–4.0)
MCH: 30.2 pg (ref 26.0–34.0)
MCHC: 32.3 g/dL (ref 30.0–36.0)
MCV: 93.6 fL (ref 80.0–100.0)
Monocytes Absolute: 0.4 10*3/uL (ref 0.1–1.0)
Monocytes Relative: 9 %
Neutro Abs: 2.5 10*3/uL (ref 1.7–7.7)
Neutrophils Relative %: 63 %
Platelets: 150 10*3/uL (ref 150–400)
RBC: 4.37 MIL/uL (ref 3.87–5.11)
RDW: 11.8 % (ref 11.5–15.5)
WBC: 4 10*3/uL (ref 4.0–10.5)
nRBC: 0 % (ref 0.0–0.2)

## 2020-04-13 LAB — VITAMIN D 25 HYDROXY (VIT D DEFICIENCY, FRACTURES): Vit D, 25-Hydroxy: 51.37 ng/mL (ref 30–100)

## 2020-04-13 LAB — VITAMIN B12: Vitamin B-12: 399 pg/mL (ref 180–914)

## 2020-04-13 LAB — LACTATE DEHYDROGENASE: LDH: 150 U/L (ref 98–192)

## 2020-04-13 NOTE — Progress Notes (Signed)
[facility], [place] [address]   CLINIC:  Medical Oncology/Hematology  PCP:  Celene Squibb, MD [place] [address] [phone]   REASON FOR VISIT: Follow-up for breast cancer   CURRENT THERAPY: Anastrozole  BRIEF ONCOLOGIC HISTORY:  Oncology History  Breast cancer of upper-inner quadrant of left female breast ([place])  09/10/2018 Initial Diagnosis   Breast cancer of upper-inner quadrant of left female breast ([place])   11/14/2018 Genetic Testing   Negative genetic testing on the multi-cancer panel.  The Multi-Gene Panel offered by [facility] includes sequencing and/or deletion duplication testing of the following 84 genes: AIP, ALK, APC, ATM, AXIN2,BAP1,  BARD1, BLM, BMPR1A, BRCA1, BRCA2, BRIP1, CASR, CDC73, CDH1, CDK4, CDKN1B, CDKN1C, CDKN2A (p14ARF), CDKN2A (p16INK4a), CEBPA, CHEK2, CTNNA1, DICER1, DIS3L2, EGFR (c.2369C>T, p.Thr790Met variant only), EPCAM (Deletion/duplication testing only), FH, FLCN, GATA2, GPC3, GREM1 (Promoter region deletion/duplication testing only), HOXB13 (c.251G>A, p.Gly84Glu), HRAS, KIT, MAX, MEN1, MET, MITF (c.952G>A, p.Glu318Lys variant only), MLH1, MSH2, MSH3, MSH6, MUTYH, NBN, NF1, NF2, NTHL1, PALB2, PDGFRA, PHOX2B, PMS2, POLD1, POLE, POT1, PRKAR1A, PTCH1, PTEN, RAD50, RAD51C, RAD51D, RB1, RECQL4, RET, RUNX1, SDHAF2, SDHA (sequence changes only), SDHB, SDHC, SDHD, SMAD4, SMARCA4, SMARCB1, SMARCE1, STK11, SUFU, TERC, TERT, TMEM127, TP53, TSC1, TSC2, VHL, WRN and WT1.  The report date is November 14, 2018.      INTERVAL HISTORY:  Darlene Moore 74 y.o. female returns for routine follow-up for breast cancer.  Patient reports she is doing well since her last visit.  She is taking her medication as prescribed with no unwanted side effects.  She denies any new lumps or bumps present.  She denies any new bone pain. Denies any nausea, vomiting, or diarrhea. Denies any new pains. Had not noticed any recent bleeding  such as epistaxis, hematuria or hematochezia. Denies recent chest pain on exertion, shortness of breath on minimal exertion, pre-syncopal episodes, or palpitations. Denies any numbness or tingling in hands or feet. Denies any recent fevers, infections, or recent hospitalizations. Patient reports appetite at 75% and energy level at 50%.  She is eating well maintain her weight at this time.     REVIEW OF SYSTEMS:  Review of Systems  Respiratory: Positive for cough.   Neurological: Positive for dizziness.  All other systems reviewed and are negative.    PAST MEDICAL/SURGICAL HISTORY:  Past Medical History:  Diagnosis Date  . Concussion   . Family history of breast cancer   . Family history of kidney cancer   . Family history of ovarian cancer   . Family history of stomach cancer   . Family history of uterine cancer   . High cholesterol   . Medical history non-contributory   . Seizures ([place])    last seizure 05/27/19   Past Surgical History:  Procedure Laterality Date  . CHOLECYSTECTOMY    . COLONOSCOPY    . COLONOSCOPY N/A 09/29/2015   Procedure: COLONOSCOPY;  Surgeon: Rogene Houston, MD;  Location: [facility];  Service: Endoscopy;  Laterality: N/A;  730 - moved to 9:00 - Ann to notify pt  . PARTIAL MASTECTOMY WITH NEEDLE LOCALIZATION AND AXILLARY SENTINEL LYMPH NODE BX Left 10/01/2018   Procedure: LEFT BREAST PARTIAL MASTECTOMY WITH SENTINEL LYMPH NODE BIOPSY AFTER NEEDLE LOCALIZATION AND RADIOTRACER PLACEMENT;  Surgeon: Virl Cagey, MD;  Location: [facility] ORS;  Service: General;  Laterality: Left;     SOCIAL HISTORY:  Social History   Socioeconomic History  . Marital status: Widowed  Spouse name: Not on file  . Number of children: 1  . Years of education: 15  . Highest education level: Not on file  Occupational History  . Not on file  Tobacco Use  . Smoking status: Never Smoker  . Smokeless tobacco: Never Used  Substance and Sexual Activity  . Alcohol use: No  .  Drug use: No  . Sexual activity: Not on file  Other Topics Concern  . Not on file  Social History Narrative   07/2019  Lives home alone. Widow.   Retired/ not working.   Education 12th grade.  Children 1.  Drinks decaff coffee, tea.    Social Determinants of Health   Financial Resource Strain:   . Difficulty of Paying Living Expenses:   Food Insecurity:   . Worried About Charity fundraiser in the Last Year:   . Arboriculturist in the Last Year:   Transportation Needs:   . Film/video editor (Medical):   Marland Kitchen Lack of Transportation (Non-Medical):   Physical Activity:   . Days of Exercise per Week:   . Minutes of Exercise per Session:   Stress:   . Feeling of Stress :   Social Connections:   . Frequency of Communication with Friends and Family:   . Frequency of Social Gatherings with Friends and Family:   . Attends Religious Services:   . Active Member of Clubs or Organizations:   . Attends Archivist Meetings:   Marland Kitchen Marital Status:   Intimate Partner Violence:   . Fear of Current or Ex-Partner:   . Emotionally Abused:   Marland Kitchen Physically Abused:   . Sexually Abused:     FAMILY HISTORY:  Family History  Problem Relation Age of Onset  . Breast cancer Sister 56       d. 75  . Liver cancer Sister   . Breast cancer Sister 89       double mastectomy  . Diabetes Mother   . Heart disease Father        d. 65  . Seizures Other        maternal  . Kidney cancer Maternal Aunt   . Kidney cancer Maternal Uncle   . Brain cancer Paternal Aunt   . Uterine cancer Maternal Grandmother   . Leukemia Paternal Grandmother   . Ovarian cancer Niece 23  . Uterine cancer Niece 36  . Stomach cancer Niece 84  . Stomach cancer Maternal Uncle   . Kidney cancer Paternal Aunt   . Kidney cancer Paternal Aunt   . Bladder Cancer Paternal Aunt   . Skin cancer Paternal Aunt        on ankle; d. 99    CURRENT MEDICATIONS:  Outpatient Encounter Medications as of 04/13/2020  Medication Sig   . anastrozole (ARIMIDEX) 1 MG tablet TAKE 1 TABLET EVERY DAY  . Calcium Carbonate (CALCIUM 600 PO) Take by mouth 2 (two) times daily.  . Ibuprofen-diphenhydrAMINE Cit (ADVIL PM PO) Take 1 tablet by mouth at bedtime.  . Lacosamide 150 MG TABS Take 1 tablet (150 mg total) by mouth in the morning and at bedtime.  . lamoTRIgine (LAMICTAL) 25 MG tablet Take 2 tablets (50 mg total) by mouth 2 (two) times daily.  Marland Kitchen levETIRAcetam (KEPPRA XR) 500 MG 24 hr tablet TAKE 3 TABLETS TWICE DAILY  . Multiple Vitamin (MULTIVITAMIN) tablet Take 1 tablet by mouth daily.  . Probiotic Product (PROBIOTIC PO) Take 2 Doses by mouth daily. Natures made  brand  . sertraline (ZOLOFT) 25 MG tablet Take 25 mg by mouth daily.   Marland Kitchen VITAMIN D PO Take 1,000 Units by mouth.  Marland Kitchen ibuprofen (ADVIL,MOTRIN) 200 MG tablet Take 400 mg by mouth every 8 (eight) hours as needed for mild pain or moderate pain.  (Patient not taking: Reported on 04/13/2020)  . [DISCONTINUED] anastrozole (ARIMIDEX) 1 MG tablet TAKE 1 TABLET(1 MG) BY MOUTH DAILY  . [DISCONTINUED] sertraline (ZOLOFT) 50 MG tablet    No facility-administered encounter medications on file as of 04/13/2020.    ALLERGIES:  No Known Allergies   PHYSICAL EXAM:  ECOG Performance status: 1  Vitals:   04/13/20 1334  BP: (!) 108/55  Pulse: (!) 55  Resp: 18  Temp: 97.6 F (36.4 C)  SpO2: 98%   Filed Weights   04/13/20 1334  Weight: 196 lb 10.4 oz (89.2 kg)   Physical Exam Constitutional:      Appearance: Normal appearance. She is normal weight.  Cardiovascular:     Rate and Rhythm: Normal rate and regular rhythm.     Heart sounds: Normal heart sounds.  Pulmonary:     Effort: Pulmonary effort is normal.     Breath sounds: Normal breath sounds.  Abdominal:     General: Bowel sounds are normal.     Palpations: Abdomen is soft.  Musculoskeletal:        General: Normal range of motion.  Skin:    General: Skin is warm.  Neurological:     Mental Status: She is alert  and oriented to person, place, and time. Mental status is at baseline.  Psychiatric:        Mood and Affect: Mood normal.        Behavior: Behavior normal.        Thought Content: Thought content normal.        Judgment: Judgment normal.      LABORATORY DATA:  I have reviewed the labs as listed.  CBC    Component Value Date/Time   WBC 4.0 04/13/2020 1239   RBC 4.37 04/13/2020 1239   HGB 13.2 04/13/2020 1239   HCT 40.9 04/13/2020 1239   PLT 150 04/13/2020 1239   MCV 93.6 04/13/2020 1239   MCH 30.2 04/13/2020 1239   MCHC 32.3 04/13/2020 1239   RDW 11.8 04/13/2020 1239   LYMPHSABS 1.0 04/13/2020 1239   MONOABS 0.4 04/13/2020 1239   EOSABS 0.1 04/13/2020 1239   BASOSABS 0.0 04/13/2020 1239   CMP Latest Ref Rng & Units 04/13/2020 12/03/2019 08/11/2019  Glucose 70 - 99 mg/dL 103(H) 99 101(H)  BUN 8 - 23 mg/dL 18 26(H) 18  Creatinine 0.44 - 1.00 mg/dL 0.75 0.71 0.78  Sodium 135 - 145 mmol/L 142 138 140  Potassium 3.5 - 5.1 mmol/L 5.0 4.1 4.1  Chloride 98 - 111 mmol/L 104 102 104  CO2 22 - 32 mmol/L '27 26 25  ' Calcium 8.9 - 10.3 mg/dL 10.0 9.4 9.6  Total Protein 6.5 - 8.1 g/dL 7.3 7.6 7.4  Total Bilirubin 0.3 - 1.2 mg/dL 0.5 0.6 0.8  Alkaline Phos 38 - 126 U/L 83 101 87  AST 15 - 41 U/L '19 22 22  ' ALT 0 - 44 U/L '14 20 18    ' All questions were answered to patient's stated satisfaction. Encouraged patient to call with any new concerns or questions before his next visit to the cancer center and we can certain see him sooner, if needed.     ASSESSMENT & PLAN:  Breast cancer of upper-inner quadrant of left female breast ([place]) 1. Stage I (PT1cPN0) left breast invasive ductal carcinoma (IDC): -Screening mammogram on 08/07/2018 once BI-RADS Category 0. - She had left breast mammogram on 08/26/2018 with additional views, and ultrasound showing 0.9 x 0.9 x 0.7 cm mass at 10 o'clock position.  She also had an ultrasound of the left axilla at that time which was negative. - Breast biopsy  was done on 09/02/2018.  This showed IDC, grade 1, DCIS intermediate grade, ER/PR positive, her-2 negative. -She underwent left breast lumpectomy and sentinel lymph node biopsy on 10/01/2018. -Pathology showed 1.6 cm IDC, grade 1.  The deep margins were positive even though it was resected to the pectoralis muscle.  0/1 lymph nodes was involved.  ER was 100% positive, PR 20% and HER-2/neu negative.  Ki 67 was 2%. - Because of the positive deep margins, Dr. Delton Coombes recommended radiation therapy.  She was also scheduled for another mammogram as they could not trace the radioactive seed. - She was referred to Dr. Francesca Jewett for radiation. Looks like she was seen by him in June 2020 and has another follow up in December 2020.  -She was started on antiestrogen therapy in 10/2018 with anastrozole 1 mg daily she will take this for 5 years. -She is tolerating the anastrozole well with no side effects at this time. -She is getting the recommended daily exercise. -Mammogram was done on 08/11/2019 and was B RADS category 2 benign -Labs on 04/13/2020 were all within normal limits -We will see her back in 4 months with repeat labs and mammogram  2.osteopenia: - DEXA scan on 07/18/2015 showed T score of -1.7. - DEXA scan on 11/06/2018 showed a T score of -2.1.  Results are reviewed with the patient on 12/10/2018. - She stopped her calcium due to constipation.  She was reeducated on the importance of it and that she may add a stool softener with that. - She was educated on Prolia to prevent osteoporosis.  However she wanted to start taking her calcium twice a day and wait on that treatment. -We will repeat a DEXA scan in 10/2020  3.seizure disorder: -She will continue Keppra 1500 mg p.o. twice daily. -Her last seizure was on 12/07/2019.  No hospitalization required.  Neurologist aware.       Orders placed this encounter:  Orders Placed This Encounter  Procedures  . MM DIAG BREAST TOMO BILATERAL  . Lactate  dehydrogenase  . CBC with Differential/Platelet  . Comprehensive metabolic panel  . Vitamin B12  . VITAMIN D 25 Hydroxy (Vit-D Deficiency, Fractures)      Francene Finders, FNP-C [facility] [phone]

## 2020-04-13 NOTE — Patient Instructions (Signed)
[place] at [facility] Discharge Instructions  Follow up in 4 months with labs and mammogram   Thank you for choosing [place] at [facility] to provide your oncology and hematology care.  To afford each patient quality time with our provider, please arrive at least 15 minutes before your scheduled appointment time.   If you have a lab appointment with the [facility] please come in thru the Main Entrance and check in at the main information desk.  You need to re-schedule your appointment should you arrive 10 or more minutes late.  We strive to give you quality time with our providers, and arriving late affects you and other patients whose appointments are after yours.  Also, if you no show three or more times for appointments you may be dismissed from the clinic at the providers discretion.     Again, thank you for choosing [facility]  Our hope is that these requests will decrease the amount of time that you wait before being seen by our physicians.       _____________________________________________________________  Should you have questions after your visit to [facility], please contact our office at [phone] and follow the prompts.  Our office hours are 8:00 a.m. and 4:30 p.m. Monday - Friday.  Please note that voicemails left after 4:00 p.m. may not be returned until the following business day.  We are closed weekends and major holidays.  You do have access to a nurse 24-7, just call the main number to the clinic [phone] and do not press any options, hold on the line and a nurse will answer the phone.    For prescription refill requests, have your pharmacy contact our office and allow 72 hours.    Due to Covid, you will need to wear a mask upon entering the hospital. If you do not have a mask, a mask will be given to you at the Main Entrance upon arrival. For doctor visits, patients may have 1 support  person age 49 or older with them. For treatment visits, patients can not have anyone with them due to social distancing guidelines and our immunocompromised population.

## 2020-04-13 NOTE — Assessment & Plan Note (Addendum)
1. Stage I (PT1cPN0) left breast invasive ductal carcinoma (IDC): -Screening mammogram on 08/07/2018 once BI-RADS Category 0. - She had left breast mammogram on 08/26/2018 with additional views, and ultrasound showing 0.9 x 0.9 x 0.7 cm mass at 10 o'clock position.  She also had an ultrasound of the left axilla at that time which was negative. - Breast biopsy was done on 09/02/2018.  This showed IDC, grade 1, DCIS intermediate grade, ER/PR positive, her-2 negative. -She underwent left breast lumpectomy and sentinel lymph node biopsy on 10/01/2018. -Pathology showed 1.6 cm IDC, grade 1.  The deep margins were positive even though it was resected to the pectoralis muscle.  0/1 lymph nodes was involved.  ER was 100% positive, PR 20% and HER-2/neu negative.  Ki 67 was 2%. - Because of the positive deep margins, Dr. Delton Coombes recommended radiation therapy.  She was also scheduled for another mammogram as they could not trace the radioactive seed. - She was referred to Dr. Francesca Jewett for radiation. Looks like she was seen by him in June 2020 and has another follow up in December 2020.  -She was started on antiestrogen therapy in 10/2018 with anastrozole 1 mg daily she will take this for 5 years. -She is tolerating the anastrozole well with no side effects at this time. -She is getting the recommended daily exercise. -Mammogram was done on 08/11/2019 and was B RADS category 2 benign -Labs on 04/13/2020 were all within normal limits -We will see her back in 4 months with repeat labs and mammogram  2.osteopenia: - DEXA scan on 07/18/2015 showed T score of -1.7. - DEXA scan on 11/06/2018 showed a T score of -2.1.  Results are reviewed with the patient on 12/10/2018. - She stopped her calcium due to constipation.  She was reeducated on the importance of it and that she may add a stool softener with that. - She was educated on Prolia to prevent osteoporosis.  However she wanted to start taking her calcium twice a  day and wait on that treatment. -We will repeat a DEXA scan in 10/2020  3.seizure disorder: -She will continue Keppra 1500 mg p.o. twice daily. -Her last seizure was on 12/07/2019.  No hospitalization required.  Neurologist aware.

## 2020-04-18 ENCOUNTER — Ambulatory Visit: Payer: Medicare HMO | Admitting: Diagnostic Neuroimaging

## 2020-04-18 ENCOUNTER — Other Ambulatory Visit: Payer: Self-pay

## 2020-04-18 ENCOUNTER — Encounter: Payer: Self-pay | Admitting: Diagnostic Neuroimaging

## 2020-04-18 VITALS — BP 120/69 | HR 50 | Ht 66.0 in | Wt 197.4 lb

## 2020-04-18 DIAGNOSIS — G40109 Localization-related (focal) (partial) symptomatic epilepsy and epileptic syndromes with simple partial seizures, not intractable, without status epilepticus: Secondary | ICD-10-CM | POA: Diagnosis not present

## 2020-04-18 MED ORDER — LEVETIRACETAM ER 500 MG PO TB24: 1500.0000 mg | ORAL_TABLET | Freq: Two times a day (BID) | ORAL | 4 refills | Status: DC

## 2020-04-18 MED ORDER — LAMOTRIGINE 25 MG PO TABS: 50.0000 mg | ORAL_TABLET | Freq: Two times a day (BID) | ORAL | 4 refills | Status: DC

## 2020-04-18 MED ORDER — LACOSAMIDE 150 MG PO TABS: 150.0000 mg | ORAL_TABLET | Freq: Two times a day (BID) | ORAL | 5 refills | Status: DC

## 2020-04-18 NOTE — Patient Instructions (Signed)
-   continue levetiracetam ER 1500mg  twice a day   - continue vimpat 150mg  twice a day   - continue lamotrigine 50mg  twice a day   - no driving
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
## 2020-04-19 DIAGNOSIS — R7301 Impaired fasting glucose: Secondary | ICD-10-CM | POA: Diagnosis not present

## 2020-04-19 DIAGNOSIS — D696 Thrombocytopenia, unspecified: Secondary | ICD-10-CM | POA: Diagnosis not present

## 2020-04-19 DIAGNOSIS — E782 Mixed hyperlipidemia: Secondary | ICD-10-CM | POA: Diagnosis not present

## 2020-04-19 DIAGNOSIS — E7849 Other hyperlipidemia: Secondary | ICD-10-CM | POA: Diagnosis not present

## 2020-04-27 DIAGNOSIS — K5900 Constipation, unspecified: Secondary | ICD-10-CM | POA: Diagnosis not present

## 2020-04-27 DIAGNOSIS — D696 Thrombocytopenia, unspecified: Secondary | ICD-10-CM | POA: Diagnosis not present

## 2020-04-27 DIAGNOSIS — R7301 Impaired fasting glucose: Secondary | ICD-10-CM | POA: Diagnosis not present

## 2020-04-27 DIAGNOSIS — M25561 Pain in right knee: Secondary | ICD-10-CM | POA: Diagnosis not present

## 2020-04-27 DIAGNOSIS — C50212 Malignant neoplasm of upper-inner quadrant of left female breast: Secondary | ICD-10-CM | POA: Diagnosis not present

## 2020-04-27 DIAGNOSIS — F411 Generalized anxiety disorder: Secondary | ICD-10-CM | POA: Diagnosis not present

## 2020-04-27 DIAGNOSIS — M25562 Pain in left knee: Secondary | ICD-10-CM | POA: Diagnosis not present

## 2020-04-27 DIAGNOSIS — G4089 Other seizures: Secondary | ICD-10-CM | POA: Diagnosis not present

## 2020-04-27 DIAGNOSIS — E782 Mixed hyperlipidemia: Secondary | ICD-10-CM | POA: Diagnosis not present

## 2020-05-10 DIAGNOSIS — Z20828 Contact with and (suspected) exposure to other viral communicable diseases: Secondary | ICD-10-CM | POA: Diagnosis not present

## 2020-05-20 ENCOUNTER — Telehealth: Payer: Self-pay | Admitting: Diagnostic Neuroimaging

## 2020-05-20 MED ORDER — LACOSAMIDE 150 MG PO TABS: 150.0000 mg | ORAL_TABLET | Freq: Two times a day (BID) | ORAL | 3 refills | Status: DC

## 2020-05-20 NOTE — Telephone Encounter (Signed)
Pt called stating that she is needing a refill on her Vimpat 50mg  twice a day sent in to the [facility] on [address]. Pt states she will not have any for Sunday or Monday and is wanting to know if it can be called in for her today. Please call pt.

## 2020-05-20 NOTE — Telephone Encounter (Signed)
Patient requested a refill on Vimpat to be sent to her [place]. I reviewed the chart, a prescription was sent to [facility] mail order pharmacy end of July 2021. I sent a 30-day supply with 3 refills to her [place] as requested.

## 2020-06-02 ENCOUNTER — Other Ambulatory Visit (HOSPITAL_COMMUNITY): Payer: Self-pay | Admitting: Nurse Practitioner

## 2020-06-02 DIAGNOSIS — Z170 Estrogen receptor positive status [ER+]: Secondary | ICD-10-CM

## 2020-06-06 ENCOUNTER — Ambulatory Visit: Payer: Medicare HMO | Admitting: Family Medicine

## 2020-06-22 DIAGNOSIS — Z923 Personal history of irradiation: Secondary | ICD-10-CM | POA: Diagnosis not present

## 2020-06-22 DIAGNOSIS — Z853 Personal history of malignant neoplasm of breast: Secondary | ICD-10-CM | POA: Diagnosis not present

## 2020-06-22 DIAGNOSIS — C50412 Malignant neoplasm of upper-outer quadrant of left female breast: Secondary | ICD-10-CM | POA: Diagnosis not present

## 2020-06-22 DIAGNOSIS — Z170 Estrogen receptor positive status [ER+]: Secondary | ICD-10-CM | POA: Diagnosis not present

## 2020-06-22 DIAGNOSIS — Z79811 Long term (current) use of aromatase inhibitors: Secondary | ICD-10-CM | POA: Diagnosis not present

## 2020-07-04 DIAGNOSIS — R1310 Dysphagia, unspecified: Secondary | ICD-10-CM | POA: Diagnosis not present

## 2020-07-21 ENCOUNTER — Encounter (INDEPENDENT_AMBULATORY_CARE_PROVIDER_SITE_OTHER): Payer: Self-pay | Admitting: *Deleted

## 2020-07-26 ENCOUNTER — Encounter (INDEPENDENT_AMBULATORY_CARE_PROVIDER_SITE_OTHER): Payer: Self-pay

## 2020-08-03 ENCOUNTER — Other Ambulatory Visit (HOSPITAL_COMMUNITY): Payer: Self-pay | Admitting: Hematology

## 2020-08-03 DIAGNOSIS — Z170 Estrogen receptor positive status [ER+]: Secondary | ICD-10-CM

## 2020-08-03 DIAGNOSIS — C50212 Malignant neoplasm of upper-inner quadrant of left female breast: Secondary | ICD-10-CM

## 2020-08-15 ENCOUNTER — Other Ambulatory Visit (HOSPITAL_COMMUNITY): Payer: Self-pay

## 2020-08-15 DIAGNOSIS — Z170 Estrogen receptor positive status [ER+]: Secondary | ICD-10-CM

## 2020-08-15 DIAGNOSIS — C50212 Malignant neoplasm of upper-inner quadrant of left female breast: Secondary | ICD-10-CM

## 2020-08-16 ENCOUNTER — Other Ambulatory Visit: Payer: Self-pay

## 2020-08-16 ENCOUNTER — Emergency Department (HOSPITAL_COMMUNITY): Payer: Medicare HMO

## 2020-08-16 ENCOUNTER — Ambulatory Visit (HOSPITAL_COMMUNITY)
Admission: RE | Admit: 2020-08-16 | Discharge: 2020-08-16 | Disposition: A | Discharge status: Home / Self Care | Payer: Medicare HMO | Source: Ambulatory Visit | Attending: Nurse Practitioner | Admitting: Nurse Practitioner

## 2020-08-16 ENCOUNTER — Inpatient Hospital Stay (HOSPITAL_COMMUNITY): Payer: Medicare HMO | Attending: Hematology

## 2020-08-16 ENCOUNTER — Emergency Department (HOSPITAL_COMMUNITY)
Admission: EM | Admit: 2020-08-16 | Discharge: 2020-08-17 | Disposition: A | Discharge status: Home / Self Care | Payer: Medicare HMO | Attending: Emergency Medicine | Admitting: Emergency Medicine

## 2020-08-16 ENCOUNTER — Encounter (HOSPITAL_COMMUNITY): Payer: Self-pay | Admitting: *Deleted

## 2020-08-16 DIAGNOSIS — Z170 Estrogen receptor positive status [ER+]: Secondary | ICD-10-CM

## 2020-08-16 DIAGNOSIS — C50212 Malignant neoplasm of upper-inner quadrant of left female breast: Secondary | ICD-10-CM | POA: Insufficient documentation

## 2020-08-16 DIAGNOSIS — G40909 Epilepsy, unspecified, not intractable, without status epilepticus: Secondary | ICD-10-CM | POA: Diagnosis not present

## 2020-08-16 DIAGNOSIS — S0003XA Contusion of scalp, initial encounter: Secondary | ICD-10-CM | POA: Diagnosis not present

## 2020-08-16 DIAGNOSIS — R928 Other abnormal and inconclusive findings on diagnostic imaging of breast: Secondary | ICD-10-CM | POA: Diagnosis not present

## 2020-08-16 DIAGNOSIS — W228XXA Striking against or struck by other objects, initial encounter: Secondary | ICD-10-CM | POA: Diagnosis not present

## 2020-08-16 DIAGNOSIS — R569 Unspecified convulsions: Secondary | ICD-10-CM | POA: Diagnosis not present

## 2020-08-16 DIAGNOSIS — I959 Hypotension, unspecified: Secondary | ICD-10-CM | POA: Diagnosis not present

## 2020-08-16 DIAGNOSIS — R0689 Other abnormalities of breathing: Secondary | ICD-10-CM | POA: Diagnosis not present

## 2020-08-16 LAB — COMPREHENSIVE METABOLIC PANEL WITH GFR
ALT: 20 U/L (ref 0–44)
AST: 23 U/L (ref 15–41)
Albumin: 4.5 g/dL (ref 3.5–5.0)
Alkaline Phosphatase: 73 U/L (ref 38–126)
Anion gap: 11 (ref 5–15)
BUN: 17 mg/dL (ref 8–23)
CO2: 27 mmol/L (ref 22–32)
Calcium: 10.1 mg/dL (ref 8.9–10.3)
Chloride: 103 mmol/L (ref 98–111)
Creatinine, Ser: 0.77 mg/dL (ref 0.44–1.00)
GFR, Estimated: >60 mL/min
Glucose, Bld: 110 mg/dL — ABNORMAL HIGH (ref 70–99)
Potassium: 5 mmol/L (ref 3.5–5.1)
Sodium: 141 mmol/L (ref 135–145)
Total Bilirubin: 0.3 mg/dL (ref 0.3–1.2)
Total Protein: 7.7 g/dL (ref 6.5–8.1)

## 2020-08-16 LAB — CBC WITH DIFFERENTIAL/PLATELET
Abs Immature Granulocytes: 0.01 10*3/uL (ref 0.00–0.07)
Basophils Absolute: 0.1 10*3/uL (ref 0.0–0.1)
Basophils Relative: 1 %
Eosinophils Absolute: 0 10*3/uL (ref 0.0–0.5)
Eosinophils Relative: 0 %
HCT: 42.1 % (ref 36.0–46.0)
Hemoglobin: 13.6 g/dL (ref 12.0–15.0)
Immature Granulocytes: 0 %
Lymphocytes Relative: 17 %
Lymphs Abs: 0.9 10*3/uL (ref 0.7–4.0)
MCH: 30.9 pg (ref 26.0–34.0)
MCHC: 32.3 g/dL (ref 30.0–36.0)
MCV: 95.7 fL (ref 80.0–100.0)
Monocytes Absolute: 0.4 10*3/uL (ref 0.1–1.0)
Monocytes Relative: 7 %
Neutro Abs: 3.7 10*3/uL (ref 1.7–7.7)
Neutrophils Relative %: 75 %
Platelets: 150 10*3/uL (ref 150–400)
RBC: 4.4 MIL/uL (ref 3.87–5.11)
RDW: 12.4 % (ref 11.5–15.5)
WBC: 5 10*3/uL (ref 4.0–10.5)
nRBC: 0 % (ref 0.0–0.2)

## 2020-08-16 LAB — LACTATE DEHYDROGENASE: LDH: 171 U/L (ref 98–192)

## 2020-08-16 LAB — VITAMIN B12: Vitamin B-12: 364 pg/mL (ref 180–914)

## 2020-08-16 LAB — CBG MONITORING, ED: Glucose-Capillary: 100 mg/dL — ABNORMAL HIGH (ref 70–99)

## 2020-08-16 LAB — VITAMIN D 25 HYDROXY (VIT D DEFICIENCY, FRACTURES): Vit D, 25-Hydroxy: 73.71 ng/mL (ref 30–100)

## 2020-08-16 MED ORDER — LEVETIRACETAM ER 500 MG PO TB24: 1500.0000 mg | ORAL_TABLET | Freq: Every day | ORAL | Status: DC

## 2020-08-16 NOTE — ED Notes (Signed)
EKG done at this time. Patient on 12 lead.

## 2020-08-16 NOTE — ED Provider Notes (Signed)
[facility] EMERGENCY DEPARTMENT Provider Note   CSN: [id number] Arrival date & time: 08/16/20  1911     History Chief Complaint  Patient presents with  . Seizures    Darlene Moore is a 74 y.o. female.  74 yo F with a cc of a seizure.  Hx of same. Back to baseline.  Struck her head, denies other injury.    The history is provided by the patient.  Seizures Seizure activity on arrival: no   Seizure type:  Grand mal Initial focality:  None Episode characteristics: abnormal movements   Return to baseline: no   Severity:  Moderate Duration:  2 minutes Timing:  Once Number of seizures this episode:  1 Progression:  Resolved PTA treatment:  None History of seizures: yes        Past Medical History:  Diagnosis Date  . Concussion   . Family history of breast cancer   . Family history of kidney cancer   . Family history of ovarian cancer   . Family history of stomach cancer   . Family history of uterine cancer   . High cholesterol   . Medical history non-contributory   . Seizures ([place])    last seizure 03/18/20    Patient Active Problem List   Diagnosis Date Noted  . Genetic testing 11/20/2018  . Family history of breast cancer   . Family history of ovarian cancer   . Family history of uterine cancer   . Family history of stomach cancer   . Family history of kidney cancer   . Therapeutic drug monitoring 09/22/2018  . Breast cancer of upper-inner quadrant of left female breast ([place]) 09/10/2018  . Temporal lobe epilepsy ([place]) 07/28/2018  . Closed nondisplaced fracture of sixth cervical vertebra with routine healing 09/18/2017    Past Surgical History:  Procedure Laterality Date  . CHOLECYSTECTOMY    . COLONOSCOPY    . COLONOSCOPY N/A 09/29/2015   Procedure: COLONOSCOPY;  Surgeon: Rogene Houston, MD;  Location: [facility] ENDO SUITE;  Service: Endoscopy;  Laterality: N/A;  730 - moved to 9:00 - Ann to notify pt  . PARTIAL MASTECTOMY WITH NEEDLE LOCALIZATION AND  AXILLARY SENTINEL LYMPH NODE BX Left 10/01/2018   Procedure: LEFT BREAST PARTIAL MASTECTOMY WITH SENTINEL LYMPH NODE BIOPSY AFTER NEEDLE LOCALIZATION AND RADIOTRACER PLACEMENT;  Surgeon: Virl Cagey, MD;  Location: [facility] ORS;  Service: General;  Laterality: Left;     OB History   No obstetric history on file.     Family History  Problem Relation Age of Onset  . Breast cancer Sister 37       d. 59  . Liver cancer Sister   . Breast cancer Sister 77       double mastectomy  . Diabetes Mother   . Heart disease Father        d. 38  . Seizures Other        maternal  . Kidney cancer Maternal Aunt   . Kidney cancer Maternal Uncle   . Brain cancer Paternal Aunt   . Uterine cancer Maternal Grandmother   . Leukemia Paternal Grandmother   . Ovarian cancer Niece 42  . Uterine cancer Niece 43  . Stomach cancer Niece 35  . Stomach cancer Maternal Uncle   . Kidney cancer Paternal Aunt   . Kidney cancer Paternal Aunt   . Bladder Cancer Paternal Aunt   . Skin cancer Paternal Aunt        on ankle;  d. 53    Social History   Tobacco Use  . Smoking status: Never Smoker  . Smokeless tobacco: Never Used  Substance Use Topics  . Alcohol use: No  . Drug use: No    Home Medications Prior to Admission medications   Medication Sig Start Date End Date Taking? Authorizing Provider  Calcium Carbonate (CALCIUM 600 PO) Take by mouth 2 (two) times daily.   Yes [provider]  ibuprofen (ADVIL,MOTRIN) 200 MG tablet Take 400 mg by mouth every 8 (eight) hours as needed for mild pain or moderate pain.    Yes [provider]  Ibuprofen-diphenhydrAMINE Cit (ADVIL PM PO) Take 2 tablets by mouth at bedtime.    Yes [provider]  Lacosamide 150 MG TABS Take 1 tablet (150 mg total) by mouth in the morning and at bedtime. 05/20/20  Yes Star Age, MD  lamoTRIgine (LAMICTAL) 25 MG tablet Take 2 tablets (50 mg total) by mouth 2 (two) times daily. 04/18/20  Yes Penumalli, Earlean Polka, MD  levETIRAcetam (KEPPRA XR) 500 MG 24 hr tablet Take 3 tablets (1,500 mg total) by mouth in the morning and at bedtime. 04/18/20  Yes Penumalli, Earlean Polka, MD  sertraline (ZOLOFT) 25 MG tablet Take 25 mg by mouth daily.  11/25/19  Yes [provider]  VITAMIN D PO Take 1,000 Units by mouth.   Yes [provider]  Wheat Dextrin (BENEFIBER PO) Take by mouth.   Yes [provider]  amoxicillin (AMOXIL) 500 MG capsule Take 500 mg by mouth 4 (four) times daily. Patient not taking: Reported on 08/16/2020 05/26/20   [provider]  anastrozole (ARIMIDEX) 1 MG tablet TAKE 1 TABLET(1 MG) BY MOUTH DAILY Patient not taking: Reported on 08/16/2020 06/03/20   Glennie Isle, NP-C  HYDROcodone-acetaminophen (NORCO/VICODIN) 5-325 MG tablet Take 1 tablet by mouth every 6 (six) hours as needed. Patient not taking: Reported on 08/16/2020 05/26/20   [provider]  Multiple Vitamin (MULTIVITAMIN) tablet Take 1 tablet by mouth daily. Patient not taking: Reported on 08/16/2020    [provider]    Allergies    Patient has no known allergies.  Review of Systems   Review of Systems  Constitutional: Negative for chills and fever.  HENT: Negative for congestion and rhinorrhea.   Eyes: Negative for redness and visual disturbance.  Respiratory: Negative for shortness of breath and wheezing.   Cardiovascular: Negative for chest pain and palpitations.  Gastrointestinal: Negative for nausea and vomiting.  Genitourinary: Negative for dysuria and urgency.  Musculoskeletal: Negative for arthralgias and myalgias.  Skin: Negative for pallor and wound.  Neurological: Positive for seizures. Negative for dizziness and headaches.    Physical Exam Updated Vital Signs BP 128/60   Pulse 60   Temp (!) 97.1 F (36.2 C) (Temporal)   Resp 16   Ht 5\' 7"  (1.702 m)   Wt 83.9 kg   SpO2 98%   BMI 28.98 kg/m   Physical Exam Vitals and nursing note reviewed.    Constitutional:      General: She is not in acute distress.    Appearance: She is well-developed. She is not diaphoretic.  HENT:     Head: Normocephalic.     Comments: R occipital hematoma.  Eyes:     Pupils: Pupils are equal, round, and reactive to light.  Cardiovascular:     Rate and Rhythm: Normal rate and regular rhythm.     Heart sounds: No murmur heard.  No  friction rub. No gallop.   Pulmonary:     Effort: Pulmonary effort is normal.     Breath sounds: No wheezing or rales.  Abdominal:     General: There is no distension.     Palpations: Abdomen is soft.     Tenderness: There is no abdominal tenderness.  Musculoskeletal:        General: No tenderness.     Cervical back: Normal range of motion and neck supple.  Skin:    General: Skin is warm and dry.  Neurological:     Mental Status: She is alert and oriented to person, place, and time.  Psychiatric:        Behavior: Behavior normal.     ED Results / Procedures / Treatments   Labs (all labs ordered are listed, but only abnormal results are displayed) Labs Reviewed  CBG MONITORING, ED - Abnormal; Notable for the following components:      Result Value   Glucose-Capillary 100 (*)    All other components within normal limits    EKG None  Radiology MM DIAG BREAST TOMO BILATERAL  Result Date: 08/16/2020 CLINICAL DATA:  74 year old female with history left breast cancer post lumpectomy 10/01/2018. EXAM: DIGITAL DIAGNOSTIC BILATERAL MAMMOGRAM WITH TOMO AND CAD COMPARISON:  Previous exams. ACR Breast Density Category b: There are scattered areas of fibroglandular density. FINDINGS: No suspicious masses or calcifications identified in either breast. Stable appearance of lumpectomy changes in the upper inner posterior left breast. Spot compression magnification cc view of the left breast lumpectomy site was performed. There is no mammographic evidence of locally recurrent malignancy. Mammographic images were processed  with CAD. IMPRESSION: Stable appearance of breast lumpectomy site. No mammographic evidence of malignancy in either breast. RECOMMENDATION: Diagnostic mammogram is suggested in 1 year. (Code:DM-B-01Y) I have discussed the findings and recommendations with the patient. If applicable, a reminder letter will be sent to the patient regarding the next appointment. BI-RADS CATEGORY  2: Benign. Electronically Signed   By: Everlean Alstrom M.D.   On: 08/16/2020 10:46    Procedures Procedures (including critical care time)  Medications Ordered in ED Medications  levETIRAcetam (KEPPRA XR) 24 hr tablet 1,500 mg (has no administration in time range)    ED Course  I have reviewed the triage vital signs and the nursing notes.  Pertinent labs & imaging results that were available during my care of the patient were reviewed by me and considered in my medical decision making (see chart for details).    MDM Rules/Calculators/A&P                          74 yo F with a cc of seizure.  Hx of same.  Stuck her head.  Denies current symptoms. Will CT head.   Awaiting CT, signed out to Dr. Christy Gentles, please see his note for further details of care in the ED.  The patients results and plan were reviewed and discussed.   Any x-rays performed were independently reviewed by myself.   Differential diagnosis were considered with the presenting HPI.  Medications - No data to display  Vitals:   08/16/20 2330 08/16/20 2345 08/17/20 0000 08/17/20 0110  BP: (!) 129/59  132/72 127/68  Pulse: 60 (!) 58 61 65  Resp: 19 16 15    Temp:      TempSrc:      SpO2: 97% 97% 96% 98%  Weight:      Height:  Final diagnoses:  Seizure [facility])       Final Clinical Impression(s) / ED Diagnoses Final diagnoses:  None    Rx / DC Orders ED Discharge Orders    None       Deno Etienne, DO 08/17/20 [phone]

## 2020-08-16 NOTE — ED Notes (Signed)
Spoke with son, Sunya Humbarger, [phone], and updated him on patient status.  Please call with any further updates or when patient is being discharged.

## 2020-08-16 NOTE — ED Triage Notes (Signed)
Pt with seizure activity at church tonight and fell back hit head on concrete.  Pt with hx of seizures.  Pt alert and oriented in triage.

## 2020-08-17 DIAGNOSIS — R569 Unspecified convulsions: Secondary | ICD-10-CM | POA: Diagnosis not present

## 2020-08-17 NOTE — ED Notes (Signed)
20G IV in right wrist, that was inserted by EMS, was removed. Catheter intact.

## 2020-08-17 NOTE — Discharge Instructions (Addendum)
Please be aware you may have another seizure ° °Do not drive until seen by your physician for your condition ° °Do not climb ladders/roofs/trees as a seizure can occur at that height and cause serious harm ° °Do not bathe/swim alone as a seizure can occur and cause serious harm ° °Please followup with your physician or neurologist for further testing and possible treatment ° ° °

## 2020-08-17 NOTE — ED Provider Notes (Signed)
I assumed care in signout to follow-up on CT imaging.  CT head is negative.  Patient is back to baseline.  She is requesting discharge so she can go home and take her medications.  Patient is awake alert in no distress at this time   Ripley Fraise, MD 08/17/20 [phone]

## 2020-08-23 ENCOUNTER — Inpatient Hospital Stay (HOSPITAL_COMMUNITY): Payer: Medicare HMO | Admitting: Hematology

## 2020-09-01 DIAGNOSIS — Z23 Encounter for immunization: Secondary | ICD-10-CM | POA: Diagnosis not present

## 2020-09-08 ENCOUNTER — Ambulatory Visit (INDEPENDENT_AMBULATORY_CARE_PROVIDER_SITE_OTHER): Payer: Medicare HMO | Admitting: Gastroenterology

## 2020-10-20 ENCOUNTER — Encounter: Payer: Self-pay | Admitting: *Deleted

## 2020-10-26 ENCOUNTER — Other Ambulatory Visit: Payer: Self-pay | Admitting: *Deleted

## 2020-10-26 DIAGNOSIS — G40109 Localization-related (focal) (partial) symptomatic epilepsy and epileptic syndromes with simple partial seizures, not intractable, without status epilepticus: Secondary | ICD-10-CM

## 2020-10-26 MED ORDER — LACOSAMIDE 150 MG PO TABS: 150.0000 mg | ORAL_TABLET | Freq: Two times a day (BID) | ORAL | 3 refills | Status: DC

## 2020-10-26 NOTE — Telephone Encounter (Signed)
Called patient and advised her I received fax from [facility] order pharmacy to send 3 seizure medication refill to them. She is only due for vimpat refill at this time; she asked that vimpat refill be sent to [facility]. She stated that since her seizure in Nov she has been fine, confirmed her FU in April. I advised she call for any seizures, questions, concerns before FU. Patient verbalized understanding, appreciation. Vimpat refill e scribed to [facility].

## 2020-10-31 ENCOUNTER — Telehealth: Payer: Self-pay | Admitting: *Deleted

## 2020-10-31 MED ORDER — LACOSAMIDE 150 MG PO TABS: 150.0000 mg | ORAL_TABLET | Freq: Two times a day (BID) | ORAL | 1 refills | Status: DC

## 2020-10-31 NOTE — Telephone Encounter (Signed)
[place], ID [id number], Rx Bin [id number], PCN [id number], Group [id number]. Vimpat PA. Key: [id number], [id number]. Received reply: available without authorization. [facility], spoke with Gregary Signs who stated that they are just waiting on 90 day supply Rx to be sent. I advised her it was just sent. She verified they have received it, stated patient must call them to request it be shipped because it is a new Rx. I advised will let her know.  Call [phone]. Called patient, advised her of above. Patient verbalized understanding, appreciation.

## 2020-10-31 NOTE — Addendum Note (Signed)
Addended by: Minna Antis on: 10/31/2020 01:15 PM   Modules accepted: Orders

## 2020-10-31 NOTE — Telephone Encounter (Signed)
Patient called stating that she received a call from her insurance ([employer]) stating that they have not received a PA for Vimpat? Patient also wondering if she will continue to get a 90-day supply of Vimpat. Please call patient to advise.

## 2020-10-31 NOTE — Telephone Encounter (Addendum)
Called patient to obtain insurance information. SHe has [employer] advantage plan  Id: [id number]  Rx bin [id number], PCN [id number], grp [id number], pharmacy claims [phone]. I adivsed will owrk on PA for Vimpat. She has 5 weeks of medication. Advised the Rx can be changed to a 90 day supply. Patient verbalized understanding, appreciation.

## 2020-12-21 DIAGNOSIS — Z923 Personal history of irradiation: Secondary | ICD-10-CM | POA: Diagnosis not present

## 2020-12-21 DIAGNOSIS — C50412 Malignant neoplasm of upper-outer quadrant of left female breast: Secondary | ICD-10-CM | POA: Diagnosis not present

## 2020-12-21 DIAGNOSIS — Z170 Estrogen receptor positive status [ER+]: Secondary | ICD-10-CM | POA: Diagnosis not present

## 2020-12-21 DIAGNOSIS — Z79811 Long term (current) use of aromatase inhibitors: Secondary | ICD-10-CM | POA: Diagnosis not present

## 2020-12-21 DIAGNOSIS — Z853 Personal history of malignant neoplasm of breast: Secondary | ICD-10-CM | POA: Diagnosis not present

## 2020-12-21 DIAGNOSIS — Z08 Encounter for follow-up examination after completed treatment for malignant neoplasm: Secondary | ICD-10-CM | POA: Diagnosis not present

## 2020-12-26 DIAGNOSIS — Z136 Encounter for screening for cardiovascular disorders: Secondary | ICD-10-CM | POA: Diagnosis not present

## 2020-12-26 DIAGNOSIS — M8580 Other specified disorders of bone density and structure, unspecified site: Secondary | ICD-10-CM | POA: Diagnosis not present

## 2020-12-26 DIAGNOSIS — Z8669 Personal history of other diseases of the nervous system and sense organs: Secondary | ICD-10-CM | POA: Diagnosis not present

## 2020-12-26 DIAGNOSIS — Z23 Encounter for immunization: Secondary | ICD-10-CM | POA: Diagnosis not present

## 2020-12-26 DIAGNOSIS — G4089 Other seizures: Secondary | ICD-10-CM | POA: Diagnosis not present

## 2020-12-26 DIAGNOSIS — E6609 Other obesity due to excess calories: Secondary | ICD-10-CM | POA: Diagnosis not present

## 2020-12-26 DIAGNOSIS — D696 Thrombocytopenia, unspecified: Secondary | ICD-10-CM | POA: Diagnosis not present

## 2020-12-26 DIAGNOSIS — F411 Generalized anxiety disorder: Secondary | ICD-10-CM | POA: Diagnosis not present

## 2020-12-26 DIAGNOSIS — E7849 Other hyperlipidemia: Secondary | ICD-10-CM | POA: Diagnosis not present

## 2020-12-26 DIAGNOSIS — R5383 Other fatigue: Secondary | ICD-10-CM | POA: Diagnosis not present

## 2020-12-26 DIAGNOSIS — M25561 Pain in right knee: Secondary | ICD-10-CM | POA: Diagnosis not present

## 2020-12-26 DIAGNOSIS — Z0001 Encounter for general adult medical examination with abnormal findings: Secondary | ICD-10-CM | POA: Diagnosis not present

## 2020-12-26 DIAGNOSIS — E782 Mixed hyperlipidemia: Secondary | ICD-10-CM | POA: Diagnosis not present

## 2020-12-26 DIAGNOSIS — K5900 Constipation, unspecified: Secondary | ICD-10-CM | POA: Diagnosis not present

## 2020-12-26 DIAGNOSIS — R001 Bradycardia, unspecified: Secondary | ICD-10-CM | POA: Diagnosis not present

## 2020-12-26 DIAGNOSIS — R42 Dizziness and giddiness: Secondary | ICD-10-CM | POA: Diagnosis not present

## 2021-01-02 ENCOUNTER — Other Ambulatory Visit (HOSPITAL_COMMUNITY): Payer: Self-pay

## 2021-01-02 ENCOUNTER — Telehealth (HOSPITAL_COMMUNITY): Payer: Self-pay

## 2021-01-02 DIAGNOSIS — Z170 Estrogen receptor positive status [ER+]: Secondary | ICD-10-CM

## 2021-01-02 DIAGNOSIS — C50212 Malignant neoplasm of upper-inner quadrant of left female breast: Secondary | ICD-10-CM

## 2021-01-02 MED ORDER — ANASTROZOLE 1 MG PO TABS: ORAL_TABLET | ORAL | 4 refills | Status: DC

## 2021-01-02 NOTE — Telephone Encounter (Signed)
Patient called and wanted to know if she is supposed to continue taking her Anastrozole even though she had surgery.  This nurse advised patient that according to the MD notes she started this medication in 10/2018 and she is supposed to continue for five years.  Patient stated that she needs a refill.  This nurse processed refill for patient.  No further questions or concerns at this time.

## 2021-01-17 ENCOUNTER — Ambulatory Visit: Payer: Medicare HMO | Admitting: Family Medicine

## 2021-01-27 ENCOUNTER — Other Ambulatory Visit (HOSPITAL_COMMUNITY): Payer: Self-pay | Admitting: *Deleted

## 2021-01-27 DIAGNOSIS — C50212 Malignant neoplasm of upper-inner quadrant of left female breast: Secondary | ICD-10-CM

## 2021-01-27 DIAGNOSIS — Z170 Estrogen receptor positive status [ER+]: Secondary | ICD-10-CM

## 2021-01-30 ENCOUNTER — Inpatient Hospital Stay (HOSPITAL_COMMUNITY): Payer: PPO | Attending: Hematology

## 2021-01-30 ENCOUNTER — Telehealth: Payer: Self-pay | Admitting: *Deleted

## 2021-01-30 DIAGNOSIS — M8580 Other specified disorders of bone density and structure, unspecified site: Secondary | ICD-10-CM | POA: Diagnosis not present

## 2021-01-30 DIAGNOSIS — Z79811 Long term (current) use of aromatase inhibitors: Secondary | ICD-10-CM | POA: Insufficient documentation

## 2021-01-30 DIAGNOSIS — Z170 Estrogen receptor positive status [ER+]: Secondary | ICD-10-CM

## 2021-01-30 DIAGNOSIS — C50212 Malignant neoplasm of upper-inner quadrant of left female breast: Secondary | ICD-10-CM | POA: Insufficient documentation

## 2021-01-30 DIAGNOSIS — G40109 Localization-related (focal) (partial) symptomatic epilepsy and epileptic syndromes with simple partial seizures, not intractable, without status epilepticus: Secondary | ICD-10-CM

## 2021-01-30 LAB — COMPREHENSIVE METABOLIC PANEL
ALT: 15 U/L (ref 0–44)
AST: 20 U/L (ref 15–41)
Albumin: 4 g/dL (ref 3.5–5.0)
Alkaline Phosphatase: 99 U/L (ref 38–126)
Anion gap: 5 (ref 5–15)
BUN: 21 mg/dL (ref 8–23)
CO2: 28 mmol/L (ref 22–32)
Calcium: 9.4 mg/dL (ref 8.9–10.3)
Chloride: 105 mmol/L (ref 98–111)
Creatinine, Ser: 0.71 mg/dL (ref 0.44–1.00)
GFR, Estimated: >60 mL/min (ref >60)
Glucose, Bld: 106 mg/dL — ABNORMAL HIGH (ref 70–99)
Potassium: 4.7 mmol/L (ref 3.5–5.1)
Sodium: 138 mmol/L (ref 135–145)
Total Bilirubin: 0.5 mg/dL (ref 0.3–1.2)
Total Protein: 7.1 g/dL (ref 6.5–8.1)

## 2021-01-30 LAB — CBC WITH DIFFERENTIAL/PLATELET
Abs Immature Granulocytes: 0.01 10*3/uL (ref 0.00–0.07)
Basophils Absolute: 0.1 10*3/uL (ref 0.0–0.1)
Basophils Relative: 1 %
Eosinophils Absolute: 0.1 10*3/uL (ref 0.0–0.5)
Eosinophils Relative: 2 %
HCT: 41.1 % (ref 36.0–46.0)
Hemoglobin: 13.1 g/dL (ref 12.0–15.0)
Immature Granulocytes: 0 %
Lymphocytes Relative: 20 %
Lymphs Abs: 1 10*3/uL (ref 0.7–4.0)
MCH: 31.1 pg (ref 26.0–34.0)
MCHC: 31.9 g/dL (ref 30.0–36.0)
MCV: 97.6 fL (ref 80.0–100.0)
Monocytes Absolute: 0.4 10*3/uL (ref 0.1–1.0)
Monocytes Relative: 7 %
Neutro Abs: 3.7 10*3/uL (ref 1.7–7.7)
Neutrophils Relative %: 70 %
Platelets: 159 10*3/uL (ref 150–400)
RBC: 4.21 MIL/uL (ref 3.87–5.11)
RDW: 12.7 % (ref 11.5–15.5)
WBC: 5.2 10*3/uL (ref 4.0–10.5)
nRBC: 0 % (ref 0.0–0.2)

## 2021-01-30 LAB — LACTATE DEHYDROGENASE: LDH: 153 U/L (ref 98–192)

## 2021-01-30 LAB — VITAMIN B12: Vitamin B-12: 384 pg/mL (ref 180–914)

## 2021-01-30 LAB — VITAMIN D 25 HYDROXY (VIT D DEFICIENCY, FRACTURES): Vit D, 25-Hydroxy: 37.18 ng/mL (ref 30–100)

## 2021-01-30 NOTE — Telephone Encounter (Signed)
LVM requesting call back or my chart to advise if Okay to refill levetiracetam to [facility].

## 2021-01-31 MED ORDER — LEVETIRACETAM ER 500 MG PO TB24: 1500.0000 mg | ORAL_TABLET | Freq: Two times a day (BID) | ORAL | 4 refills | Status: DC

## 2021-01-31 NOTE — Telephone Encounter (Signed)
Patient called and stated she now uses [facility] mail pharmacy because she doesn't drive.  I advised will send Rx to them. She asked to be reminded of her FU, gave her date, time. Patient verbalized understanding, appreciation. Rx for levetiracetam sent in.

## 2021-02-03 ENCOUNTER — Ambulatory Visit: Payer: PPO | Admitting: Cardiology

## 2021-02-06 ENCOUNTER — Other Ambulatory Visit: Payer: Self-pay

## 2021-02-06 ENCOUNTER — Inpatient Hospital Stay (HOSPITAL_COMMUNITY): Payer: PPO | Admitting: Hematology

## 2021-02-06 VITALS — BP 111/51 | HR 56 | Temp 96.9°F | Resp 18 | Wt 195.0 lb

## 2021-02-06 DIAGNOSIS — C50212 Malignant neoplasm of upper-inner quadrant of left female breast: Secondary | ICD-10-CM | POA: Diagnosis not present

## 2021-02-06 DIAGNOSIS — Z170 Estrogen receptor positive status [ER+]: Secondary | ICD-10-CM

## 2021-02-06 NOTE — Patient Instructions (Addendum)
[place] at [facility] Discharge Instructions  You were seen today by Dr. Delton Coombes. He went over your recent results. Continue taking calcium + vitamin D. You will be scheduled for a bone density scan prior to your next visit. You will also be scheduled a mammogram after 08/16/2021. Dr. Delton Coombes will see you back in 4 months for labs and follow up.   Thank you for choosing [place] at [facility] to provide your oncology and hematology care.  To afford each patient quality time with our provider, please arrive at least 15 minutes before your scheduled appointment time.   If you have a lab appointment with the [facility] please come in thru the Main Entrance and check in at the main information desk  You need to re-schedule your appointment should you arrive 10 or more minutes late.  We strive to give you quality time with our providers, and arriving late affects you and other patients whose appointments are after yours.  Also, if you no show three or more times for appointments you may be dismissed from the clinic at the providers discretion.     Again, thank you for choosing [facility].  Our hope is that these requests will decrease the amount of time that you wait before being seen by our physicians.       _____________________________________________________________  Should you have questions after your visit to [facility], please contact our office at [phone] between the hours of 8:00 a.m. and 4:30 p.m.  Voicemails left after 4:00 p.m. will not be returned until the following business day.  For prescription refill requests, have your pharmacy contact our office and allow 72 hours.    Cancer Center Support Programs:   > Cancer Support Group  2nd Tuesday of the month 1pm-2pm, Journey Room

## 2021-02-06 NOTE — Progress Notes (Signed)
[facility] [address] [place] [address]   Patient Care Team: Celene Squibb, MD as PCP - General (Internal Medicine)  SUMMARY OF ONCOLOGIC HISTORY: Oncology History  Breast cancer of upper-inner quadrant of left female breast ([place])  09/10/2018 Initial Diagnosis   Breast cancer of upper-inner quadrant of left female breast ([place])   11/14/2018 Genetic Testing   Negative genetic testing on the multi-cancer panel.  The Multi-Gene Panel offered by [facility] includes sequencing and/or deletion duplication testing of the following 84 genes: AIP, ALK, APC, ATM, AXIN2,BAP1,  BARD1, BLM, BMPR1A, BRCA1, BRCA2, BRIP1, CASR, CDC73, CDH1, CDK4, CDKN1B, CDKN1C, CDKN2A (p14ARF), CDKN2A (p16INK4a), CEBPA, CHEK2, CTNNA1, DICER1, DIS3L2, EGFR (c.2369C>T, p.Thr790Met variant only), EPCAM (Deletion/duplication testing only), FH, FLCN, GATA2, GPC3, GREM1 (Promoter region deletion/duplication testing only), HOXB13 (c.251G>A, p.Gly84Glu), HRAS, KIT, MAX, MEN1, MET, MITF (c.952G>A, p.Glu318Lys variant only), MLH1, MSH2, MSH3, MSH6, MUTYH, NBN, NF1, NF2, NTHL1, PALB2, PDGFRA, PHOX2B, PMS2, POLD1, POLE, POT1, PRKAR1A, PTCH1, PTEN, RAD50, RAD51C, RAD51D, RB1, RECQL4, RET, RUNX1, SDHAF2, SDHA (sequence changes only), SDHB, SDHC, SDHD, SMAD4, SMARCA4, SMARCB1, SMARCE1, STK11, SUFU, TERC, TERT, TMEM127, TP53, TSC1, TSC2, VHL, WRN and WT1.  The report date is November 14, 2018.     CHIEF COMPLIANT: breast cancer of upper inner quadrant of left breast   INTERVAL HISTORY: [name] is a 75 y.o. female here today for follow up of her upper inner quadrant of left breast. Her last visit was on 04/13/2020.   Today she reports feeling very well. She denies hot flashes or any new aches or pains. She has been taking calcium+vitamin D.     REVIEW OF SYSTEMS:   Review of Systems  Constitutional: Negative for appetite change and fatigue.  HENT:   Positive for trouble swallowing.    Gastrointestinal: Positive for constipation.  Neurological: Positive for dizziness and headaches (occasional).  All other systems reviewed and are negative.   I have reviewed the past medical history, past surgical history, social history and family history with the patient and they are unchanged from previous note.   ALLERGIES:   has No Known Allergies.   MEDICATIONS:  Current Outpatient Medications  Medication Sig Dispense Refill  . amoxicillin (AMOXIL) 500 MG capsule Take 500 mg by mouth 4 (four) times daily. (Patient not taking: Reported on 08/16/2020)    . anastrozole (ARIMIDEX) 1 MG tablet TAKE 1 TABLET(1 MG) BY MOUTH DAILY 30 tablet 4  . Calcium Carbonate (CALCIUM 600 PO) Take by mouth 2 (two) times daily.    Marland Kitchen HYDROcodone-acetaminophen (NORCO/VICODIN) 5-325 MG tablet Take 1 tablet by mouth every 6 (six) hours as needed. (Patient not taking: Reported on 08/16/2020)    . ibuprofen (ADVIL,MOTRIN) 200 MG tablet Take 400 mg by mouth every 8 (eight) hours as needed for mild pain or moderate pain.     . Ibuprofen-diphenhydrAMINE Cit (ADVIL PM PO) Take 2 tablets by mouth at bedtime.     . Lacosamide 150 MG TABS Take 1 tablet (150 mg total) by mouth in the morning and at bedtime. Changed to 90 day supply 180 tablet 1  . lamoTRIgine (LAMICTAL) 25 MG tablet Take 2 tablets (50 mg total) by mouth 2 (two) times daily. 360 tablet 4  . levETIRAcetam (KEPPRA XR) 500 MG 24 hr tablet Take 3 tablets (1,500 mg total) by mouth in the morning and at bedtime. 540 tablet 4  . Multiple Vitamin (MULTIVITAMIN) tablet Take 1 tablet by mouth daily. (Patient not taking: Reported  on 08/16/2020)    . sertraline (ZOLOFT) 25 MG tablet Take 25 mg by mouth daily.     Marland Kitchen VITAMIN D PO Take 1,000 Units by mouth.    . Wheat Dextrin (BENEFIBER PO) Take by mouth.     No current facility-administered medications for this visit.     PHYSICAL EXAMINATION: Performance status (ECOG): 1 - Symptomatic but completely  ambulatory  There were no vitals filed for this visit. Wt Readings from Last 3 Encounters:  08/16/20 185 lb (83.9 kg)  04/18/20 197 lb 6.4 oz (89.5 kg)  04/13/20 196 lb 10.4 oz (89.2 kg)   Physical Exam Vitals reviewed.  Constitutional:      Appearance: Normal appearance.  Cardiovascular:     Rate and Rhythm: Normal rate and regular rhythm.     Pulses: Normal pulses.     Heart sounds: Normal heart sounds.  Pulmonary:     Effort: Pulmonary effort is normal.     Breath sounds: Normal breath sounds.  Chest:  Breasts:     Right: No mass, axillary adenopathy or supraclavicular adenopathy.     Left: No mass, skin change (uppe innner quadrant lumpectomy scar healed and normal), axillary adenopathy or supraclavicular adenopathy.    Abdominal:     Palpations: Abdomen is soft. There is no hepatomegaly, splenomegaly or mass.     Tenderness: There is no abdominal tenderness.  Musculoskeletal:     Right lower leg: No edema.     Left lower leg: No edema.  Lymphadenopathy:     Upper Body:     Right upper body: No supraclavicular, axillary or pectoral adenopathy.     Left upper body: No supraclavicular, axillary or pectoral adenopathy.  Neurological:     General: No focal deficit present.     Mental Status: She is alert and oriented to person, place, and time.  Psychiatric:        Mood and Affect: Mood normal.        Behavior: Behavior normal.     Breast Exam Chaperone: Thana Ates     LABORATORY DATA:  I have reviewed the data as listed CMP Latest Ref Rng & Units 01/30/2021 08/16/2020 04/13/2020  Glucose 70 - 99 mg/dL 106(H) 110(H) 103(H)  BUN 8 - 23 mg/dL _0 Creatinine 0.44 - 1.00 mg/dL 0.71 0.77 0.75  Sodium 135 - 145 mmol/L 138 141 142  Potassium 3.5 - 5.1 mmol/L 4.7 5.0 5.0  Chloride 98 - 111 mmol/L 105 103 104  CO2 22 - 32 mmol/L _1 Calcium 8.9 - 10.3 mg/dL 9.4 10.1 10.0  Total Protein 6.5 - 8.1 g/dL 7.1 7.7 7.3  Total Bilirubin 0.3 - 1.2 mg/dL 0.5 0.3  0.5  Alkaline Phos 38 - 126 U/L 99 73 83  AST 15 - 41 U/L _2 ALT 0 - 44 U/L _3 No results found for: KVT552 Lab Results  Component Value Date   WBC 5.2 01/30/2021   HGB 13.1 01/30/2021   HCT 41.1 01/30/2021   MCV 97.6 01/30/2021   PLT 159 01/30/2021   NEUTROABS 3.7 01/30/2021    ASSESSMENT:  1. Stage I (PT1cPN0) left breast invasive ductal carcinoma (IDC): -Screening mammogram on 08/07/2018 once BI-RADS Category 0. - She had left breast mammogram on 08/26/2018 with additional views, and ultrasound showing 0.9 x 0.9 x 0.7 cm mass at 10 o'clock position.  She also had an ultrasound of the left axilla at that time  which was negative. - Breast biopsy was done on 09/02/2018.  This showed IDC, grade 1, DCIS intermediate grade, ER/PR positive, her-2 negative. -She underwent left breast lumpectomy and sentinel lymph node biopsy on 10/01/2018. -Pathology showed 1.6 cm IDC, grade 1.  The deep margins were positive even though it was resected to the pectoralis muscle.  0/1 lymph nodes was involved.  ER was 100% positive, PR 20% and HER-2/neu negative.  Ki 67 was 2%. - Because of the positive deep margins, Dr. Delton Coombes recommended radiation therapy.  She was also scheduled for another mammogram as they could not trace the radioactive seed. - She was referred to Dr. Francesca Jewett for radiation. Looks like she was seen by him in June 2020 and has another follow up in December 2020.  -She was started on antiestrogen therapy in 10/2018 with anastrozole 1 mg daily she will take this for 5 years. -Last mammogram on 08/16/2020 within normal limits.  2.osteopenia: - DEXA scan on 07/18/2015 showed T score of -1.7. - DEXA scan on 11/06/2018 showed a T score of -2.1.  Results are reviewed with the patient on 12/10/2018.   3.seizure disorder: -She will continue Keppra 1500 mg p.o. twice daily. -Her last seizure was on 12/07/2019.  No hospitalization required.  Neurologist aware.   PLAN:  1.  Stage I (PT1cPN0) left breast invasive ductal carcinoma (IDC): - Physical examination today did not reveal any palpable masses.  Left breast upper inner quadrant lumpectomy scar is within normal limits.  No palpable adenopathy. - Reviewed labs from 01/30/2021 which showed normal chemistries. - Continue anastrozole for 5 years. - Recommend mammogram after 08/16/2021.  RTC 4 months for follow-up.  2.Osteopenia: - Continue calcium and vitamin D once daily.  Vitamin D level today is 37. - Recommend DEXA scan prior to next visit in 4 months.   Breast Cancer therapy associated bone loss: I have recommended calcium, Vitamin D and weight bearing exercises.  Orders placed this encounter:  No orders of the defined types were placed in this encounter.   The patient has a good understanding of the overall plan. She agrees with it. She will call with any problems that may develop before the next visit here.  Derek Jack, MD Standard [phone]   I, Thana Ates, am acting as a scribe for Dr. Derek Jack.  I, Derek Jack MD, have reviewed the above documentation for accuracy and completeness, and I agree with the above.

## 2021-02-09 ENCOUNTER — Ambulatory Visit (INDEPENDENT_AMBULATORY_CARE_PROVIDER_SITE_OTHER): Payer: PPO

## 2021-02-09 ENCOUNTER — Other Ambulatory Visit: Payer: Self-pay

## 2021-02-09 ENCOUNTER — Ambulatory Visit: Payer: PPO | Admitting: Cardiology

## 2021-02-09 ENCOUNTER — Encounter: Payer: Self-pay | Admitting: Cardiology

## 2021-02-09 VITALS — BP 121/61 | HR 72 | Ht 66.0 in | Wt 194.8 lb

## 2021-02-09 DIAGNOSIS — R001 Bradycardia, unspecified: Secondary | ICD-10-CM

## 2021-02-09 DIAGNOSIS — R011 Cardiac murmur, unspecified: Secondary | ICD-10-CM | POA: Diagnosis not present

## 2021-02-09 NOTE — Progress Notes (Unsigned)
Patient enrolled for [employer] to ship a 14 day ZIO XT monitor to her home. 

## 2021-02-09 NOTE — Progress Notes (Signed)
Cardiology Office Note:    Date:  02/09/2021   ID:  Darlene Moore, DOB Nov 30, 1945, MRN [id number]  PCP:  Celene Squibb, MD  Cardiologist:  None  Electrophysiologist:  None   Referring MD: Celene Squibb, MD   Chief Complaint  Patient presents with  . Bradycardia    History of Present Illness:    Darlene Moore is a 75 y.o. female with a hx of seizures, hyperlipidemia who is referred by Dr. Nevada Crane for evaluation of bradycardia.    Today, she is doing well. She states that at her recent doctor visit her blood pressure and heart rate reading was low. She states that she has seizures and with her seizure medications comes lightheadedness but denies any syncope. She states that she also experiences lightheadedness when she wakes in the morning. She states that when she experiences the lightheadedness she sits on her bed until her symptoms alleviate. She states that she has not had any seizures recently.  She denies any chest pain, palpitations,SOB, or lower extremity edema.  Past Medical History:  Diagnosis Date  . Concussion   . Family history of breast cancer   . Family history of kidney cancer   . Family history of ovarian cancer   . Family history of stomach cancer   . Family history of uterine cancer   . High cholesterol   . Medical history non-contributory   . Seizures ([place])    last seizure 03/18/20    Past Surgical History:  Procedure Laterality Date  . CHOLECYSTECTOMY    . COLONOSCOPY    . COLONOSCOPY N/A 09/29/2015   Procedure: COLONOSCOPY;  Surgeon: Rogene Houston, MD;  Location: [facility] SUITE;  Service: Endoscopy;  Laterality: N/A;  730 - moved to 9:00 - Ann to notify pt  . PARTIAL MASTECTOMY WITH NEEDLE LOCALIZATION AND AXILLARY SENTINEL LYMPH NODE BX Left 10/01/2018   Procedure: LEFT BREAST PARTIAL MASTECTOMY WITH SENTINEL LYMPH NODE BIOPSY AFTER NEEDLE LOCALIZATION AND RADIOTRACER PLACEMENT;  Surgeon: Virl Cagey, MD;  Location: [facility] ORS;  Service: General;   Laterality: Left;    Current Medications: Current Meds  Medication Sig  . anastrozole (ARIMIDEX) 1 MG tablet TAKE 1 TABLET(1 MG) BY MOUTH DAILY  . Calcium Carbonate (CALCIUM 600 PO) Take by mouth 2 (two) times daily.  Marland Kitchen ibuprofen (ADVIL,MOTRIN) 200 MG tablet Take 400 mg by mouth every 8 (eight) hours as needed for mild pain or moderate pain.  . Lacosamide 150 MG TABS Take 1 tablet (150 mg total) by mouth in the morning and at bedtime. Changed to 90 day supply  . lamoTRIgine (LAMICTAL) 25 MG tablet Take 2 tablets (50 mg total) by mouth 2 (two) times daily.  Marland Kitchen levETIRAcetam (KEPPRA XR) 500 MG 24 hr tablet Take 3 tablets (1,500 mg total) by mouth in the morning and at bedtime.  . Multiple Vitamin (MULTIVITAMIN) tablet Take 1 tablet by mouth daily.  Marland Kitchen VITAMIN D PO Take 1,000 Units by mouth.  . Wheat Dextrin (BENEFIBER PO) Take by mouth.     Allergies:   Patient has no known allergies.   Social History   Socioeconomic History  . Marital status: Widowed    Spouse name: Not on file  . Number of children: 1  . Years of education: 62  . Highest education level: Not on file  Occupational History  . Not on file  Tobacco Use  . Smoking status: Never Smoker  . Smokeless tobacco: Never Used  Substance and  Sexual Activity  . Alcohol use: No  . Drug use: No  . Sexual activity: Not on file  Other Topics Concern  . Not on file  Social History Narrative   07/2019  Lives home alone. Widow.   Retired/ not working.   Education 12th grade.  Children 1.  Drinks decaff coffee, tea.    Social Determinants of Health   Financial Resource Strain: Not on file  Food Insecurity: Not on file  Transportation Needs: Not on file  Physical Activity: Not on file  Stress: Not on file  Social Connections: Not on file     Family History: The patient's family history includes Bladder Cancer in her paternal aunt; Brain cancer in her paternal aunt; Breast cancer (age of onset: 90) in her sister; Breast  cancer (age of onset: 68) in her sister; Diabetes in her mother; Heart disease in her father; Kidney cancer in her maternal aunt, maternal uncle, paternal aunt, and paternal aunt; Leukemia in her paternal grandmother; Liver cancer in her sister; Ovarian cancer (age of onset: 51) in her niece; Seizures in an other family member; Skin cancer in her paternal aunt; Stomach cancer in her maternal uncle; Stomach cancer (age of onset: 62) in her niece; Uterine cancer in her maternal grandmother; Uterine cancer (age of onset: 23) in her niece.  ROS:   Please see the history of present illness.     All other systems reviewed and are negative.  EKGs/Labs/Other Studies Reviewed:    The following studies were reviewed today:   EKG:   02/09/2021- The ekg ordered today demonstrates Normal sinus rhythm, first degree AV block, Rate- 72, No S/T abnormalities   Recent Labs: 01/30/2021: ALT 15; BUN 21; Creatinine, Ser 0.71; Hemoglobin 13.1; Platelets 159; Potassium 4.7; Sodium 138  Recent Lipid Panel No results found for: CHOL, TRIG, HDL, CHOLHDL, VLDL, LDLCALC, LDLDIRECT  Physical Exam:    VS:  BP 121/61   Pulse 72   Ht 5\' 6"  (1.676 m)   Wt 194 lb 12.8 oz (88.4 kg)   SpO2 95%   BMI 31.44 kg/m     Wt Readings from Last 3 Encounters:  02/09/21 194 lb 12.8 oz (88.4 kg)  02/06/21 195 lb (88.5 kg)  08/16/20 185 lb (83.9 kg)     GEN:  Well nourished, well developed in no acute distress HEENT: Normal NECK: No JVD; No carotid bruits LYMPHATICS: No lymphadenopathy CARDIAC: RRR, 3/6 systolic murmur, rubs, gallops RESPIRATORY:  Clear to auscultation without rales, wheezing or rhonchi  ABDOMEN: Soft, non-tender, non-distended MUSCULOSKELETAL:  No edema; No deformity  SKIN: Warm and dry NEUROLOGIC:  Alert and oriented x 3 PSYCHIATRIC:  Normal affect   ASSESSMENT:    1. Bradycardia   2. Heart murmur    PLAN:    Bradycardia: Heart rate in 70s in clinic today but was previously in 60s at Dr. Juel Burrow  office.  Will check Zio patch x14 days to evaluate for bradyarrhythmia  Murmur: 3/6 systolic murmur loudest at RUSB.  Will check echocardiogram  RTC in 3 months   Medication Adjustments/Labs and Tests Ordered: Current medicines are reviewed at length with the patient today.  Concerns regarding medicines are outlined above.  Orders Placed This Encounter  Procedures  . LONG TERM MONITOR (3-14 DAYS)  . EKG 12-Lead  . ECHOCARDIOGRAM COMPLETE   No orders of the defined types were placed in this encounter.   Patient Instructions  Medication Instructions:  Your physician recommends that you continue on your current  medications as directed. Please refer to the Current Medication list given to you today.  Testing/Procedures: Your physician has requested that you have an echocardiogram. Echocardiography is a painless test that uses sound waves to create images of your heart. It provides your doctor with information about the size and shape of your heart and how well your heart's chambers and valves are working. This procedure takes approximately one hour. There are no restrictions for this procedure. This will be done at our [facility] location:  [facility] has requested you wear a ZIO patch monitor for _14__ days.  This is a single patch monitor.   [employer] supplies one patch monitor per enrollment. Additional stickers are not available. Please do not apply patch if you will be having a Nuclear Stress Test, Echocardiogram, Cardiac CT, MRI, or Chest Xray during the period you would be wearing the monitor. The patch cannot be worn during these tests. You cannot remove and re-apply the ZIO XT patch monitor.  Your ZIO patch monitor will be sent Fed Ex from [employer] directly to your home address. It may take 3-5 days to receive your monitor after you have been enrolled.  Once you have received your monitor,  please review the enclosed instructions. Your monitor has already been registered assigning a specific monitor serial # to you.  Billing and Patient Assistance Program Information   We have supplied [employer] with any of your insurance information on file for billing purposes. [employer] offers a sliding scale Patient Assistance Program for patients that do not have insurance, or whose insurance does not completely cover the cost of the ZIO monitor.   You must apply for the Patient Assistance Program to qualify for this discounted rate.     To apply, please call [employer] at [phone], select option 4, then select option 2, and ask to apply for Patient Assistance Program.  Theodore Demark will ask your household income, and how many people are in your household.  They will quote your out-of-pocket cost based on that information.  [employer] will also be able to set up a 80-month, interest-free payment plan if needed.  Applying the monitor   Shave hair from upper left chest.  Hold abrader disc by orange tab. Rub abrader in 40 strokes over the upper left chest as indicated in your monitor instructions.  Clean area with 4 enclosed alcohol pads. Let dry.  Apply patch as indicated in monitor instructions. Patch will be placed under collarbone on left side of chest with arrow pointing upward.  Rub patch adhesive wings for 2 minutes. Remove white label marked "1". Remove the white label marked "2". Rub patch adhesive wings for 2 additional minutes.  While looking in a mirror, press and release button in center of patch. A small green light will flash 3-4 times. This will be your only indicator that the monitor has been turned on. ?  Do not shower for the first 24 hours. You may shower after the first 24 hours.  Press the button if you feel a symptom. You will hear a small click. Record Date, Time and Symptom in the Patient Logbook.  When you are ready to remove the patch, follow instructions on the last 2 pages of the  Patient Logbook. Stick patch monitor onto the last page of Patient Logbook.  Place Patient Logbook in the blue and white box.  Use locking tab on box and tape  box closed securely.  The blue and white box has prepaid postage on it. Please place it in the mailbox as soon as possible. Your physician should have your test results approximately 7 days after the monitor has been mailed back to [facility].  Call [facility] at [phone] if you have questions regarding your ZIO XT patch monitor. Call them immediately if you see an orange light blinking on your monitor.  If your monitor falls off in less than 4 days, contact our Monitor department at [phone]. ?If your monitor becomes loose or falls off after 4 days call [employer] at [phone] for suggestions on securing your monitor.?    Follow-Up: At [facility], you and your health needs are our priority.  As part of our continuing mission to provide you with exceptional heart care, we have created designated Provider Care Teams.  These Care Teams include your primary Cardiologist (physician) and Advanced Practice Providers (APPs -  Physician Assistants and Nurse Practitioners) who all work together to provide you with the care you need, when you need it.  We recommend signing up for the patient portal called "MyChart".  Sign up information is provided on this After Visit Summary.  MyChart is used to connect with patients for Virtual Visits (Telemedicine).  Patients are able to view lab/test results, encounter notes, upcoming appointments, etc.  Non-urgent messages can be sent to your provider as well.   To learn more about what you can do with MyChart, go to [email or web address].    Your next appointment:   3 month(s)  The format for your next appointment:   In Person  Provider:   Oswaldo Milian, MD         Ella Bodo as a scribe for Donato Heinz, MD.,have documented all  relevant documentation on the behalf of Donato Heinz, MD,as directed by  Donato Heinz, MD while in the presence of Donato Heinz, MD.  I, Donato Heinz, MD, have reviewed all documentation for this visit. The documentation on 02/09/21 for the exam, diagnosis, procedures, and orders are all accurate and complete.   Signed, Donato Heinz, MD  02/09/2021 4:33 PM    [facility]

## 2021-02-09 NOTE — Progress Notes (Deleted)
Cardiology Office Note:    Date:  02/09/2021   ID:  Darlene Moore, DOB 1946-03-01, MRN [id number]  PCP:  Celene Squibb, MD  Cardiologist:  None  Electrophysiologist:  None   Referring MD: Celene Squibb, MD   No chief complaint on file. ***  History of Present Illness:    Darlene Moore is a 75 y.o. female with a hx of seizures, hyperlipidemia who is referred by Dr. Nevada Crane for evaluation of bradycardia.    Past Medical History:  Diagnosis Date  . Concussion   . Family history of breast cancer   . Family history of kidney cancer   . Family history of ovarian cancer   . Family history of stomach cancer   . Family history of uterine cancer   . High cholesterol   . Medical history non-contributory   . Seizures ([place])    last seizure 03/18/20    Past Surgical History:  Procedure Laterality Date  . CHOLECYSTECTOMY    . COLONOSCOPY    . COLONOSCOPY N/A 09/29/2015   Procedure: COLONOSCOPY;  Surgeon: Rogene Houston, MD;  Location: [facility];  Service: Endoscopy;  Laterality: N/A;  730 - moved to 9:00 - Ann to notify pt  . PARTIAL MASTECTOMY WITH NEEDLE LOCALIZATION AND AXILLARY SENTINEL LYMPH NODE BX Left 10/01/2018   Procedure: LEFT BREAST PARTIAL MASTECTOMY WITH SENTINEL LYMPH NODE BIOPSY AFTER NEEDLE LOCALIZATION AND RADIOTRACER PLACEMENT;  Surgeon: Virl Cagey, MD;  Location: [facility] ORS;  Service: General;  Laterality: Left;    Current Medications: No outpatient medications have been marked as taking for the 02/09/21 encounter (Appointment) with Donato Heinz, MD.     Allergies:   Patient has no known allergies.   Social History   Socioeconomic History  . Marital status: Widowed    Spouse name: Not on file  . Number of children: 1  . Years of education: 12  . Highest education level: Not on file  Occupational History  . Not on file  Tobacco Use  . Smoking status: Never Smoker  . Smokeless tobacco: Never Used  Substance and Sexual Activity  .  Alcohol use: No  . Drug use: No  . Sexual activity: Not on file  Other Topics Concern  . Not on file  Social History Narrative   07/2019  Lives home alone. Widow.   Retired/ not working.   Education 12th grade.  Children 1.  Drinks decaff coffee, tea.    Social Determinants of Health   Financial Resource Strain: Not on file  Food Insecurity: Not on file  Transportation Needs: Not on file  Physical Activity: Not on file  Stress: Not on file  Social Connections: Not on file     Family History: The patient's ***family history includes Bladder Cancer in her paternal aunt; Brain cancer in her paternal aunt; Breast cancer (age of onset: 43) in her sister; Breast cancer (age of onset: 28) in her sister; Diabetes in her mother; Heart disease in her father; Kidney cancer in her maternal aunt, maternal uncle, paternal aunt, and paternal aunt; Leukemia in her paternal grandmother; Liver cancer in her sister; Ovarian cancer (age of onset: 68) in her niece; Seizures in an other family member; Skin cancer in her paternal aunt; Stomach cancer in her maternal uncle; Stomach cancer (age of onset: 71) in her niece; Uterine cancer in her maternal grandmother; Uterine cancer (age of onset: 28) in her niece.  ROS:   Please see the history  of present illness.    *** All other systems reviewed and are negative.  EKGs/Labs/Other Studies Reviewed:    The following studies were reviewed today: ***  EKG:  EKG is *** ordered today.  The ekg ordered today demonstrates ***  Recent Labs: 01/30/2021: ALT 15; BUN 21; Creatinine, Ser 0.71; Hemoglobin 13.1; Platelets 159; Potassium 4.7; Sodium 138  Recent Lipid Panel No results found for: CHOL, TRIG, HDL, CHOLHDL, VLDL, LDLCALC, LDLDIRECT  Physical Exam:    VS:  There were no vitals taken for this visit.    Wt Readings from Last 3 Encounters:  02/06/21 195 lb (88.5 kg)  08/16/20 185 lb (83.9 kg)  04/18/20 197 lb 6.4 oz (89.5 kg)     GEN: *** Well  nourished, well developed in no acute distress HEENT: Normal NECK: No JVD; No carotid bruits LYMPHATICS: No lymphadenopathy CARDIAC: ***RRR, no murmurs, rubs, gallops RESPIRATORY:  Clear to auscultation without rales, wheezing or rhonchi  ABDOMEN: Soft, non-tender, non-distended MUSCULOSKELETAL:  No edema; No deformity  SKIN: Warm and dry NEUROLOGIC:  Alert and oriented x 3 PSYCHIATRIC:  Normal affect   ASSESSMENT:    No diagnosis found. PLAN:    Bradycardia: Heart rate in 70s in clinic today but was previously in 76s at Dr. Juel Burrow office.  Will check Zio patch x14 days to evaluate for bradyarrhythmia  Murmur: 3/6 systolic murmur loudest at RUSB.  Will check echocardiogram  RTC in 3 months   Medication Adjustments/Labs and Tests Ordered: Current medicines are reviewed at length with the patient today.  Concerns regarding medicines are outlined above.  No orders of the defined types were placed in this encounter.  No orders of the defined types were placed in this encounter.   There are no Patient Instructions on file for this visit.   Signed, Donato Heinz, MD  02/09/2021 8:38 AM    [place]

## 2021-02-09 NOTE — Patient Instructions (Signed)
Medication Instructions:  Your physician recommends that you continue on your current medications as directed. Please refer to the Current Medication list given to you today.  Testing/Procedures: Your physician has requested that you have an echocardiogram. Echocardiography is a painless test that uses sound waves to create images of your heart. It provides your doctor with information about the size and shape of your heart and how well your heart's chambers and valves are working. This procedure takes approximately one hour. There are no restrictions for this procedure. This will be done at our [facility] location:  [place] has requested you wear a ZIO patch monitor for _14__ days.  This is a single patch monitor.   [employer] supplies one patch monitor per enrollment. Additional stickers are not available. Please do not apply patch if you will be having a Nuclear Stress Test, Echocardiogram, Cardiac CT, MRI, or Chest Xray during the period you would be wearing the monitor. The patch cannot be worn during these tests. You cannot remove and re-apply the ZIO XT patch monitor.  Your ZIO patch monitor will be sent Fed Ex from [employer] directly to your home address. It may take 3-5 days to receive your monitor after you have been enrolled.  Once you have received your monitor, please review the enclosed instructions. Your monitor has already been registered assigning a specific monitor serial # to you.  Billing and Patient Assistance Program Information   We have supplied [employer] with any of your insurance information on file for billing purposes. [employer] offers a sliding scale Patient Assistance Program for patients that do not have insurance, or whose insurance does not completely cover the cost of the ZIO monitor.   You must apply for the Patient Assistance Program to qualify for this discounted rate.     To  apply, please call [employer] at [phone], select option 4, then select option 2, and ask to apply for Patient Assistance Program.  Theodore Demark will ask your household income, and how many people are in your household.  They will quote your out-of-pocket cost based on that information.  [employer] will also be able to set up a 42-month interest-free payment plan if needed.  Applying the monitor   Shave hair from upper left chest.  Hold abrader disc by orange tab. Rub abrader in 40 strokes over the upper left chest as indicated in your monitor instructions.  Clean area with 4 enclosed alcohol pads. Let dry.  Apply patch as indicated in monitor instructions. Patch will be placed under collarbone on left side of chest with arrow pointing upward.  Rub patch adhesive wings for 2 minutes. Remove white label marked "1". Remove the white label marked "2". Rub patch adhesive wings for 2 additional minutes.  While looking in a mirror, press and release button in center of patch. A small green light will flash 3-4 times. This will be your only indicator that the monitor has been turned on. ?  Do not shower for the first 24 hours. You may shower after the first 24 hours.  Press the button if you feel a symptom. You will hear a small click. Record Date, Time and Symptom in the Patient Logbook.  When you are ready to remove the patch, follow instructions on the last 2 pages of the Patient Logbook. Stick patch monitor onto the last page of Patient Logbook.  Place Patient Logbook in the blue  and white box.  Use locking tab on box and tape box closed securely.  The blue and white box has prepaid postage on it. Please place it in the mailbox as soon as possible. Your physician should have your test results approximately 7 days after the monitor has been mailed back to [facility].  Call [place] at [phone] if you have questions regarding your ZIO XT patch monitor. Call them immediately if you see  an orange light blinking on your monitor.  If your monitor falls off in less than 4 days, contact our Monitor department at [phone]. ?If your monitor becomes loose or falls off after 4 days call [employer] at [phone] for suggestions on securing your monitor.?    Follow-Up: At [facility], you and your health needs are our priority.  As part of our continuing mission to provide you with exceptional heart care, we have created designated Provider Care Teams.  These Care Teams include your primary Cardiologist (physician) and Advanced Practice Providers (APPs -  Physician Assistants and Nurse Practitioners) who all work together to provide you with the care you need, when you need it.  We recommend signing up for the patient portal called "MyChart".  Sign up information is provided on this After Visit Summary.  MyChart is used to connect with patients for Virtual Visits (Telemedicine).  Patients are able to view lab/test results, encounter notes, upcoming appointments, etc.  Non-urgent messages can be sent to your provider as well.   To learn more about what you can do with MyChart, go to [email or web address].    Your next appointment:   3 month(s)  The format for your next appointment:   In Person  Provider:   Oswaldo Milian, MD

## 2021-02-14 DIAGNOSIS — R001 Bradycardia, unspecified: Secondary | ICD-10-CM | POA: Diagnosis not present

## 2021-03-06 ENCOUNTER — Encounter (HOSPITAL_COMMUNITY): Payer: Self-pay | Admitting: Cardiology

## 2021-03-06 ENCOUNTER — Ambulatory Visit (HOSPITAL_COMMUNITY): Admission: RE | Admit: 2021-03-06 | Payer: PPO | Source: Ambulatory Visit

## 2021-03-08 ENCOUNTER — Ambulatory Visit: Payer: PPO | Admitting: Family Medicine

## 2021-03-09 DIAGNOSIS — R001 Bradycardia, unspecified: Secondary | ICD-10-CM | POA: Diagnosis not present

## 2021-03-13 ENCOUNTER — Ambulatory Visit: Payer: PPO | Admitting: Diagnostic Neuroimaging

## 2021-03-13 ENCOUNTER — Encounter: Payer: Self-pay | Admitting: Diagnostic Neuroimaging

## 2021-03-13 VITALS — BP 133/66 | HR 50 | Ht 66.0 in | Wt 187.0 lb

## 2021-03-13 DIAGNOSIS — R55 Syncope and collapse: Secondary | ICD-10-CM | POA: Diagnosis not present

## 2021-03-13 DIAGNOSIS — G40109 Localization-related (focal) (partial) symptomatic epilepsy and epileptic syndromes with simple partial seizures, not intractable, without status epilepticus: Secondary | ICD-10-CM

## 2021-03-13 MED ORDER — LEVETIRACETAM ER 500 MG PO TB24: 1500.0000 mg | ORAL_TABLET | Freq: Two times a day (BID) | ORAL | 4 refills | Status: DC

## 2021-03-13 MED ORDER — LAMOTRIGINE 25 MG PO TABS
50.0000 mg | ORAL_TABLET | Freq: Two times a day (BID) | ORAL | 4 refills | Status: DC
Start: 2021-03-13 — End: 2021-03-14

## 2021-03-13 MED ORDER — LACOSAMIDE 150 MG PO TABS: 150.0000 mg | ORAL_TABLET | Freq: Two times a day (BID) | ORAL | 4 refills | Status: DC
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
## 2021-03-14 ENCOUNTER — Telehealth: Payer: Self-pay | Admitting: Diagnostic Neuroimaging

## 2021-03-14 ENCOUNTER — Ambulatory Visit: Payer: Self-pay | Admitting: Family Medicine

## 2021-03-14 DIAGNOSIS — G40109 Localization-related (focal) (partial) symptomatic epilepsy and epileptic syndromes with simple partial seizures, not intractable, without status epilepticus: Secondary | ICD-10-CM

## 2021-03-14 MED ORDER — LACOSAMIDE 150 MG PO TABS: 150.0000 mg | ORAL_TABLET | Freq: Two times a day (BID) | ORAL | 4 refills | Status: DC

## 2021-03-14 MED ORDER — LEVETIRACETAM ER 500 MG PO TB24: 1500.0000 mg | ORAL_TABLET | Freq: Two times a day (BID) | ORAL | 4 refills | Status: DC

## 2021-03-14 MED ORDER — LAMOTRIGINE 25 MG PO TABS
50.0000 mg | ORAL_TABLET | Freq: Two times a day (BID) | ORAL | 4 refills | Status: DC
Start: 2021-03-14 — End: 2021-05-25

## 2021-03-14 NOTE — Telephone Encounter (Signed)
Meds ordered this encounter  Medications   DISCONTD: Lacosamide 150 MG TABS    Sig: Take 1 tablet (150 mg total) by mouth in the morning and at bedtime. Changed to 90 day supply    Dispense:  180 tablet    Refill:  4   lamoTRIgine (LAMICTAL) 25 MG tablet    Sig: Take 2 tablets (50 mg total) by mouth 2 (two) times daily.    Dispense:  360 tablet    Refill:  4   levETIRAcetam (KEPPRA XR) 500 MG 24 hr tablet    Sig: Take 3 tablets (1,500 mg total) by mouth in the morning and at bedtime.    Dispense:  540 tablet    Refill:  4   Lacosamide 150 MG TABS    Sig: Take 1 tablet (150 mg total) by mouth in the morning and at bedtime. Changed to 90 day supply    Dispense:  180 tablet    Refill:  [place], MD 7/65/4650, 3:54 AM Certified in Neurology, Neurophysiology and [facility] [address], [place], [place] [address] [phone]

## 2021-03-14 NOTE — Telephone Encounter (Signed)
Called [facility] and cx rx's sent in yesterday. Re-set to [facility] order per pt request.

## 2021-03-14 NOTE — Telephone Encounter (Signed)
Pt called wanting to inform the RN that she uses the [facility] for her prescriptions and not the [facility] on [address].

## 2021-03-17 ENCOUNTER — Telehealth (HOSPITAL_COMMUNITY): Payer: Self-pay | Admitting: Cardiology

## 2021-03-17 NOTE — Telephone Encounter (Signed)
Just an FYI. We have made several attempts to contact this patient including sending a letter to schedule or reschedule their echocardiogram. We will be removing the patient from the echo WQ.  03/06/21 NO SHOWED-MAILED LETTER [facility]          Thank you

## 2021-03-20 ENCOUNTER — Other Ambulatory Visit: Payer: Self-pay | Admitting: Diagnostic Neuroimaging

## 2021-03-20 DIAGNOSIS — G40109 Localization-related (focal) (partial) symptomatic epilepsy and epileptic syndromes with simple partial seizures, not intractable, without status epilepticus: Secondary | ICD-10-CM

## 2021-03-20 NOTE — Telephone Encounter (Signed)
LVM on mobile # we have on file. Called # patient used to call in, LVM advising her that Vimpat was refilled to mail order on 03/14/21. This was done at her request. Advised her we don't have Jolly in her EMR. Requested she call back as needed.

## 2021-03-20 NOTE — Telephone Encounter (Signed)
Pt calling for a refill on her VIMPAT says she only has enough left over for tonight would like the prescription called into [facility]

## 2021-03-21 MED ORDER — LACOSAMIDE 150 MG PO TABS
150.0000 mg | ORAL_TABLET | Freq: Two times a day (BID) | ORAL | 0 refills | Status: DC
Start: 2021-03-21 — End: 2021-05-25

## 2021-03-21 NOTE — Telephone Encounter (Signed)
Patient called, stated she called [facility] last week for Vimpat, was told she is in donut hole, cost will be approximately  $500. She stated that isn't a problem but [facility] hasn't sent medicine out. She skipped  her dose last nite so she could take 1 today. She is out of Vimpat. I advised will have Dr Leta Baptist send in temporary supply to local pharmacy asap. She requested RX go to [facility]. I told her Rx will be sent today. Patient verbalized understanding, appreciation.

## 2021-03-21 NOTE — Addendum Note (Signed)
Addended by: Minna Antis on: 03/21/2021 09:29 AM   Modules accepted: Orders

## 2021-05-03 ENCOUNTER — Encounter: Payer: Self-pay | Admitting: *Deleted

## 2021-05-16 ENCOUNTER — Other Ambulatory Visit: Payer: Self-pay

## 2021-05-16 ENCOUNTER — Ambulatory Visit: Payer: PPO | Admitting: Cardiology

## 2021-05-16 ENCOUNTER — Encounter: Payer: Self-pay | Admitting: Cardiology

## 2021-05-16 VITALS — BP 124/68 | HR 62 | Ht 66.0 in | Wt 194.4 lb

## 2021-05-16 DIAGNOSIS — R011 Cardiac murmur, unspecified: Secondary | ICD-10-CM

## 2021-05-16 DIAGNOSIS — R001 Bradycardia, unspecified: Secondary | ICD-10-CM | POA: Diagnosis not present

## 2021-05-16 NOTE — Patient Instructions (Addendum)
Medication Instructions:  Your physician recommends that you continue on your current medications as directed. Please refer to the Current Medication list given to you today.  *If you need a refill on your cardiac medications before your next appointment, please call your pharmacy*  Testing/Procedures: Your physician has requested that you have an echocardiogram (at [facility]). Echocardiography is a painless test that uses sound waves to create images of your heart. It provides your doctor with information about the size and shape of your heart and how well your heart's chambers and valves are working. This procedure takes approximately one hour. There are no restrictions for this procedure.   Follow-Up: At [facility], you and your health needs are our priority.  As part of our continuing mission to provide you with exceptional heart care, we have created designated Provider Care Teams.  These Care Teams include your primary Cardiologist (physician) and Advanced Practice Providers (APPs -  Physician Assistants and Nurse Practitioners) who all work together to provide you with the care you need, when you need it.  We recommend signing up for the patient portal called "MyChart".  Sign up information is provided on this After Visit Summary.  MyChart is used to connect with patients for Virtual Visits (Telemedicine).  Patients are able to view lab/test results, encounter notes, upcoming appointments, etc.  Non-urgent messages can be sent to your provider as well.   To learn more about what you can do with MyChart, go to [email or web address].    Your next appointment:   12 month(s)  The format for your next appointment:   In Person  Provider:   Oswaldo Milian, MD

## 2021-05-16 NOTE — Progress Notes (Signed)
Cardiology Office Note:    Date:  05/17/2021   ID:  Darlene Moore, DOB 1946/08/11, MRN [id number]  PCP:  Celene Squibb, MD  Cardiologist:  None  Electrophysiologist:  None   Referring MD: Celene Squibb, MD   Chief Complaint  Patient presents with   Bradycardia     History of Present Illness:    Darlene Moore is a 75 y.o. female with a hx of seizures, hyperlipidemia who presents for follow-up.  She was referred by Dr. Nevada Crane for evaluation of bradycardia.  Heart rate was in 40s at clinic visit with Dr. [place] Crane.  Zio patch x14 days on 03/09/2021 showed no significant arrhythmias, average heart rate 69 bpm.  Since last clinic visit, she denies any chest pain, dyspnea, syncope, lower extremity edema, or palpitations.  Has intermittent lightheadedness that she attributes to vertigo.  No recent syncope or seizures.  She has not been exercising due to concern for seizures.   Past Medical History:  Diagnosis Date   Concussion    Family history of breast cancer    Family history of kidney cancer    Family history of ovarian cancer    Family history of stomach cancer    Family history of uterine cancer    High cholesterol    Medical history non-contributory    Seizures ([address])    last seizure 03/18/20    Past Surgical History:  Procedure Laterality Date   CHOLECYSTECTOMY     COLONOSCOPY     COLONOSCOPY N/A 09/29/2015   Procedure: COLONOSCOPY;  Surgeon: Rogene Houston, MD;  Location: [facility];  Service: Endoscopy;  Laterality: N/A;  730 - moved to 9:00 - Ann to notify pt   PARTIAL MASTECTOMY WITH NEEDLE LOCALIZATION AND AXILLARY SENTINEL LYMPH NODE BX Left 10/01/2018   Procedure: LEFT BREAST PARTIAL MASTECTOMY WITH SENTINEL LYMPH NODE BIOPSY AFTER NEEDLE LOCALIZATION AND RADIOTRACER PLACEMENT;  Surgeon: Virl Cagey, MD;  Location: [facility] ORS;  Service: General;  Laterality: Left;    Current Medications: Current Meds  Medication Sig   anastrozole (ARIMIDEX) 1 MG tablet TAKE 1  TABLET(1 MG) BY MOUTH DAILY   Calcium Carbonate (CALCIUM 600 PO) Take by mouth 2 (two) times daily.   ibuprofen (ADVIL,MOTRIN) 200 MG tablet Take 400 mg by mouth every 8 (eight) hours as needed for mild pain or moderate pain.   Lacosamide 150 MG TABS Take 1 tablet (150 mg total) by mouth in the morning and at bedtime. Changed to 90 day supply   lamoTRIgine (LAMICTAL) 25 MG tablet Take 2 tablets (50 mg total) by mouth 2 (two) times daily.   levETIRAcetam (KEPPRA XR) 500 MG 24 hr tablet Take 3 tablets (1,500 mg total) by mouth in the morning and at bedtime.   Multiple Vitamin (MULTIVITAMIN) tablet Take 1 tablet by mouth daily.   VITAMIN D PO Take 1,000 Units by mouth.   Wheat Dextrin (BENEFIBER PO) Take by mouth.     Allergies:   Patient has no known allergies.   Social History   Socioeconomic History   Marital status: Widowed    Spouse name: Not on file   Number of children: 1   Years of education: 72   Highest education level: Not on file  Occupational History   Not on file  Tobacco Use   Smoking status: Never   Smokeless tobacco: Never  Substance and Sexual Activity   Alcohol use: No   Drug use: No   Sexual activity: Not  on file  Other Topics Concern   Not on file  Social History Narrative   07/2019  Lives home alone. Widow.   Retired/ not working.   Education 12th grade.  Children 1.  Drinks decaff coffee, tea.    Social Determinants of Health   Financial Resource Strain: Not on file  Food Insecurity: Not on file  Transportation Needs: Not on file  Physical Activity: Not on file  Stress: Not on file  Social Connections: Not on file     Family History: The patient's family history includes Bladder Cancer in her paternal aunt; Brain cancer in her paternal aunt; Breast cancer (age of onset: 66) in her sister; Breast cancer (age of onset: 63) in her sister; Diabetes in her mother; Heart disease in her father; Kidney cancer in her maternal aunt, maternal uncle, paternal  aunt, and paternal aunt; Leukemia in her paternal grandmother; Liver cancer in her sister; Ovarian cancer (age of onset: 46) in her niece; Seizures in an other family member; Skin cancer in her paternal aunt; Stomach cancer in her maternal uncle; Stomach cancer (age of onset: 57) in her niece; Uterine cancer in her maternal grandmother; Uterine cancer (age of onset: 56) in her niece.  ROS:   Please see the history of present illness.     All other systems reviewed and are negative.  EKGs/Labs/Other Studies Reviewed:    The following studies were reviewed today:   EKG:   02/09/2021- The ekg ordered today demonstrates Normal sinus rhythm, first degree AV block, Rate- 72, No S/T abnormalities   Recent Labs: 01/30/2021: ALT 15; BUN 21; Creatinine, Ser 0.71; Hemoglobin 13.1; Platelets 159; Potassium 4.7; Sodium 138  Recent Lipid Panel No results found for: CHOL, TRIG, HDL, CHOLHDL, VLDL, LDLCALC, LDLDIRECT  Physical Exam:    VS:  BP 124/68   Pulse 62   Ht '5\' 6"'$  (1.676 m)   Wt 194 lb 6.4 oz (88.2 kg)   SpO2 95%   BMI 31.38 kg/m     Wt Readings from Last 3 Encounters:  05/16/21 194 lb 6.4 oz (88.2 kg)  03/13/21 187 lb (84.8 kg)  02/09/21 194 lb 12.8 oz (88.4 kg)     GEN:  Well nourished, well developed in no acute distress HEENT: Normal NECK: No JVD; No carotid bruits LYMPHATICS: No lymphadenopathy CARDIAC: RRR, 3/6 systolic murmur, rubs, gallops RESPIRATORY:  Clear to auscultation without rales, wheezing or rhonchi  ABDOMEN: Soft, non-tender, non-distended MUSCULOSKELETAL:  No edema; No deformity  SKIN: Warm and dry NEUROLOGIC:  Alert and oriented x 3 PSYCHIATRIC:  Normal affect   ASSESSMENT:    1. Heart murmur   2. Bradycardia     PLAN:    Bradycardia: Heart rate in 40s at clinic visit with Dr. Nevada Crane.  No symptoms reported.  Zio patch x14 days showed no significant arrhythmias, average heart rate 69 bpm.   Murmur: 3/6 systolic murmur loudest at RUSB.  Echocardiogram  ordered at last clinic visit but she missed her appointment.  Will reschedule.   RTC in 1 year   Medication Adjustments/Labs and Tests Ordered: Current medicines are reviewed at length with the patient today.  Concerns regarding medicines are outlined above.  Orders Placed This Encounter  Procedures   ECHOCARDIOGRAM COMPLETE    No orders of the defined types were placed in this encounter.   Patient Instructions  Medication Instructions:  Your physician recommends that you continue on your current medications as directed. Please refer to the Current Medication list  given to you today.  *If you need a refill on your cardiac medications before your next appointment, please call your pharmacy*  Testing/Procedures: Your physician has requested that you have an echocardiogram (at [facility]). Echocardiography is a painless test that uses sound waves to create images of your heart. It provides your doctor with information about the size and shape of your heart and how well your heart's chambers and valves are working. This procedure takes approximately one hour. There are no restrictions for this procedure.   Follow-Up: At [facility], you and your health needs are our priority.  As part of our continuing mission to provide you with exceptional heart care, we have created designated Provider Care Teams.  These Care Teams include your primary Cardiologist (physician) and Advanced Practice Providers (APPs -  Physician Assistants and Nurse Practitioners) who all work together to provide you with the care you need, when you need it.  We recommend signing up for the patient portal called "MyChart".  Sign up information is provided on this After Visit Summary.  MyChart is used to connect with patients for Virtual Visits (Telemedicine).  Patients are able to view lab/test results, encounter notes, upcoming appointments, etc.  Non-urgent messages can be sent to your provider as well.   To learn more  about what you can do with MyChart, go to [email or web address].    Your next appointment:   12 month(s)  The format for your next appointment:   In Person  Provider:   Oswaldo Milian, MD       Signed, Donato Heinz, MD  05/17/2021 6:40 AM    [place]

## 2021-05-22 ENCOUNTER — Telehealth: Payer: Self-pay | Admitting: Diagnostic Neuroimaging

## 2021-05-22 NOTE — Telephone Encounter (Signed)
Pt called stating she is switching her medicare to [facility] it will not go into effect until 05/25/2021. However she is wanting her prescriptions to be sent to [facility] from now on.

## 2021-05-22 NOTE — Telephone Encounter (Signed)
Noted  

## 2021-05-22 NOTE — Telephone Encounter (Signed)
Called patient who stated she'll need vimpat refill soon. She doesn;t know which pharmacy her insurance uses. When she gets her new cards she'll call back.  I requested she send photos front and back via my chart. She stated she can't get on; her son changed it. I advised she ask him to get her back on my chart so she can send photos. Patient verbalized understanding, appreciation.

## 2021-05-22 NOTE — Telephone Encounter (Signed)
Pt called back to give insurance information.  [employer] MEM ID: [id number] RX#: [id number] GRP#: [id number]

## 2021-05-25 ENCOUNTER — Other Ambulatory Visit: Payer: Self-pay

## 2021-05-25 ENCOUNTER — Other Ambulatory Visit: Payer: Self-pay | Admitting: Diagnostic Neuroimaging

## 2021-05-25 DIAGNOSIS — G40109 Localization-related (focal) (partial) symptomatic epilepsy and epileptic syndromes with simple partial seizures, not intractable, without status epilepticus: Secondary | ICD-10-CM

## 2021-05-25 MED ORDER — LAMOTRIGINE 25 MG PO TABS
50.0000 mg | ORAL_TABLET | Freq: Two times a day (BID) | ORAL | 4 refills | Status: DC
Start: 2021-05-25 — End: 2022-03-15

## 2021-05-25 MED ORDER — LEVETIRACETAM ER 500 MG PO TB24: 1500.0000 mg | ORAL_TABLET | Freq: Two times a day (BID) | ORAL | 4 refills | Status: DC

## 2021-05-25 MED ORDER — LACOSAMIDE 150 MG PO TABS: 150.0000 mg | ORAL_TABLET | Freq: Two times a day (BID) | ORAL | 4 refills | Status: DC

## 2021-05-25 NOTE — Telephone Encounter (Signed)
Contacted pt, she just needs refill for lacosaminde

## 2021-05-25 NOTE — Telephone Encounter (Signed)
Pt has called for a refill on her vimpat, lamoTRIgine (LAMICTAL) 25 MG tablet, levETIRAcetam (KEPPRA XR) 500 MG 24 hr tablet to [facility]

## 2021-05-25 NOTE — Addendum Note (Signed)
Addended by: Gertie Baron D on: 05/25/2021 11:30 AM   Modules accepted: Orders

## 2021-05-25 NOTE — Telephone Encounter (Signed)
[place] drug registry was checked, Last filled for a temp script 03/21/21. Last seen 03/13/21, advised to FU in one yr and has upcoming 03/19/22.  Current script was until mail order arrived. Refill appropriate, Please advised

## 2021-05-31 ENCOUNTER — Ambulatory Visit (HOSPITAL_COMMUNITY)
Admission: RE | Admit: 2021-05-31 | Discharge: 2021-05-31 | Disposition: A | Discharge status: Home / Self Care | Payer: Medicare Other | Source: Ambulatory Visit | Attending: Cardiology | Admitting: Cardiology

## 2021-05-31 ENCOUNTER — Other Ambulatory Visit: Payer: Self-pay

## 2021-05-31 DIAGNOSIS — R011 Cardiac murmur, unspecified: Secondary | ICD-10-CM | POA: Diagnosis not present

## 2021-05-31 LAB — ECHOCARDIOGRAM COMPLETE
AR max vel: 2.18 cm2
AV Area VTI: 1.96 cm2
AV Area mean vel: 2.21 cm2
AV Mean grad: 4 mmHg
AV Peak grad: 7.6 mmHg
Ao pk vel: 1.38 m/s
Area-P 1/2: 3.6 cm2
MV VTI: 2.2 cm2
S' Lateral: 3.15 cm

## 2021-06-01 ENCOUNTER — Other Ambulatory Visit (HOSPITAL_COMMUNITY): Payer: Self-pay | Admitting: *Deleted

## 2021-06-01 DIAGNOSIS — Z170 Estrogen receptor positive status [ER+]: Secondary | ICD-10-CM

## 2021-06-01 DIAGNOSIS — C50212 Malignant neoplasm of upper-inner quadrant of left female breast: Secondary | ICD-10-CM

## 2021-06-01 MED ORDER — ANASTROZOLE 1 MG PO TABS: ORAL_TABLET | ORAL | 4 refills | Status: DC

## 2021-06-01 NOTE — Telephone Encounter (Signed)
Refill sent for Arimidex.  Notes reviewed and patient is continuing treatment.

## 2021-06-07 ENCOUNTER — Other Ambulatory Visit: Payer: Self-pay

## 2021-06-07 ENCOUNTER — Inpatient Hospital Stay (HOSPITAL_COMMUNITY): Payer: Medicare Other | Attending: Hematology

## 2021-06-07 ENCOUNTER — Ambulatory Visit (HOSPITAL_COMMUNITY)
Admission: RE | Admit: 2021-06-07 | Discharge: 2021-06-07 | Disposition: A | Discharge status: Home / Self Care | Payer: Medicare Other | Source: Ambulatory Visit | Attending: Hematology | Admitting: Hematology

## 2021-06-07 DIAGNOSIS — Z79899 Other long term (current) drug therapy: Secondary | ICD-10-CM | POA: Insufficient documentation

## 2021-06-07 DIAGNOSIS — Z780 Asymptomatic menopausal state: Secondary | ICD-10-CM | POA: Insufficient documentation

## 2021-06-07 DIAGNOSIS — M810 Age-related osteoporosis without current pathological fracture: Secondary | ICD-10-CM | POA: Diagnosis not present

## 2021-06-07 DIAGNOSIS — M85851 Other specified disorders of bone density and structure, right thigh: Secondary | ICD-10-CM | POA: Diagnosis not present

## 2021-06-07 DIAGNOSIS — Z170 Estrogen receptor positive status [ER+]: Secondary | ICD-10-CM | POA: Insufficient documentation

## 2021-06-07 DIAGNOSIS — Z79811 Long term (current) use of aromatase inhibitors: Secondary | ICD-10-CM | POA: Insufficient documentation

## 2021-06-07 DIAGNOSIS — C50212 Malignant neoplasm of upper-inner quadrant of left female breast: Secondary | ICD-10-CM | POA: Diagnosis not present

## 2021-06-07 LAB — CBC WITH DIFFERENTIAL/PLATELET
Abs Immature Granulocytes: 0.01 10*3/uL (ref 0.00–0.07)
Basophils Absolute: 0.1 10*3/uL (ref 0.0–0.1)
Basophils Relative: 1 %
Eosinophils Absolute: 0.1 10*3/uL (ref 0.0–0.5)
Eosinophils Relative: 2 %
HCT: 45.2 % (ref 36.0–46.0)
Hemoglobin: 14.7 g/dL (ref 12.0–15.0)
Immature Granulocytes: 0 %
Lymphocytes Relative: 25 %
Lymphs Abs: 1.1 10*3/uL (ref 0.7–4.0)
MCH: 31.1 pg (ref 26.0–34.0)
MCHC: 32.5 g/dL (ref 30.0–36.0)
MCV: 95.8 fL (ref 80.0–100.0)
Monocytes Absolute: 0.5 10*3/uL (ref 0.1–1.0)
Monocytes Relative: 10 %
Neutro Abs: 2.7 10*3/uL (ref 1.7–7.7)
Neutrophils Relative %: 62 %
Platelets: 169 10*3/uL (ref 150–400)
RBC: 4.72 MIL/uL (ref 3.87–5.11)
RDW: 12.3 % (ref 11.5–15.5)
WBC: 4.4 10*3/uL (ref 4.0–10.5)
nRBC: 0 % (ref 0.0–0.2)

## 2021-06-07 LAB — COMPREHENSIVE METABOLIC PANEL
ALT: 17 U/L (ref 0–44)
AST: 23 U/L (ref 15–41)
Albumin: 4.4 g/dL (ref 3.5–5.0)
Alkaline Phosphatase: 106 U/L (ref 38–126)
Anion gap: 9 (ref 5–15)
BUN: 20 mg/dL (ref 8–23)
CO2: 29 mmol/L (ref 22–32)
Calcium: 10 mg/dL (ref 8.9–10.3)
Chloride: 102 mmol/L (ref 98–111)
Creatinine, Ser: 0.81 mg/dL (ref 0.44–1.00)
GFR, Estimated: >60 mL/min (ref >60)
Glucose, Bld: 94 mg/dL (ref 70–99)
Potassium: 4.9 mmol/L (ref 3.5–5.1)
Sodium: 140 mmol/L (ref 135–145)
Total Bilirubin: 0.5 mg/dL (ref 0.3–1.2)
Total Protein: 7.6 g/dL (ref 6.5–8.1)

## 2021-06-07 LAB — VITAMIN D 25 HYDROXY (VIT D DEFICIENCY, FRACTURES): Vit D, 25-Hydroxy: 31.49 ng/mL (ref 30–100)

## 2021-06-07 LAB — LACTATE DEHYDROGENASE: LDH: 142 U/L (ref 98–192)

## 2021-06-07 LAB — VITAMIN B12: Vitamin B-12: 572 pg/mL (ref 180–914)

## 2021-06-09 ENCOUNTER — Encounter: Payer: Self-pay | Admitting: *Deleted

## 2021-06-11 NOTE — Progress Notes (Signed)
[facility] [address] [place] [address]   Patient Care Team: Celene Squibb, MD as PCP - General (Internal Medicine)  SUMMARY OF ONCOLOGIC HISTORY: Oncology History  Breast cancer of upper-inner quadrant of left female breast ([place])  09/10/2018 Initial Diagnosis   Breast cancer of upper-inner quadrant of left female breast ([place])   11/14/2018 Genetic Testing   Negative genetic testing on the multi-cancer panel.  The Multi-Gene Panel offered by [facility] includes sequencing and/or deletion duplication testing of the following 84 genes: AIP, ALK, APC, ATM, AXIN2,BAP1,  BARD1, BLM, BMPR1A, BRCA1, BRCA2, BRIP1, CASR, CDC73, CDH1, CDK4, CDKN1B, CDKN1C, CDKN2A (p14ARF), CDKN2A (p16INK4a), CEBPA, CHEK2, CTNNA1, DICER1, DIS3L2, EGFR (c.2369C>T, p.Thr790Met variant only), EPCAM (Deletion/duplication testing only), FH, FLCN, GATA2, GPC3, GREM1 (Promoter region deletion/duplication testing only), HOXB13 (c.251G>A, p.Gly84Glu), HRAS, KIT, MAX, MEN1, MET, MITF (c.952G>A, p.Glu318Lys variant only), MLH1, MSH2, MSH3, MSH6, MUTYH, NBN, NF1, NF2, NTHL1, PALB2, PDGFRA, PHOX2B, PMS2, POLD1, POLE, POT1, PRKAR1A, PTCH1, PTEN, RAD50, RAD51C, RAD51D, RB1, RECQL4, RET, RUNX1, SDHAF2, SDHA (sequence changes only), SDHB, SDHC, SDHD, SMAD4, SMARCA4, SMARCB1, SMARCE1, STK11, SUFU, TERC, TERT, TMEM127, TP53, TSC1, TSC2, VHL, WRN and WT1.  The report date is November 14, 2018.     CHIEF COMPLIANT: Follow-up of breast cancer of upper inner quadrant of left breast   INTERVAL HISTORY: [name] is a 75 y.o. female here today for follow up of her breast cancer of upper inner quadrant of left breast. Her last visit was on 02/06/2021.   Today she reports feeling good. She reports headaches and dizziness which she associates with chronic seizures; she denies any seizures since her last visit. She also reports difficulty swallowing pills. She is taking anastrozole and tolerating it well; she denies  any hot flashes, aches, and pains.   REVIEW OF SYSTEMS:   Review of Systems  Constitutional:  Negative for appetite change and fatigue.  HENT:   Positive for trouble swallowing (pills).   Gastrointestinal:  Positive for constipation.  Endocrine: Negative for hot flashes.  Musculoskeletal:  Negative for arthralgias and myalgias.  Neurological:  Positive for dizziness, headaches and seizures.  Psychiatric/Behavioral:  Positive for sleep disturbance. The patient is nervous/anxious.   All other systems reviewed and are negative.  I have reviewed the past medical history, past surgical history, social history and family history with the patient and they are unchanged from previous note.   ALLERGIES:   has No Known Allergies.   MEDICATIONS:  Current Outpatient Medications  Medication Sig Dispense Refill   anastrozole (ARIMIDEX) 1 MG tablet TAKE 1 TABLET(1 MG) BY MOUTH DAILY 30 tablet 4   Calcium Carbonate (CALCIUM 600 PO) Take by mouth 2 (two) times daily.     ibuprofen (ADVIL,MOTRIN) 200 MG tablet Take 400 mg by mouth every 8 (eight) hours as needed for mild pain or moderate pain.     Lacosamide 150 MG TABS Take 1 tablet (150 mg total) by mouth in the morning and at bedtime. Changed to 90 day supply 180 tablet 4   lamoTRIgine (LAMICTAL) 25 MG tablet Take 2 tablets (50 mg total) by mouth 2 (two) times daily. 360 tablet 4   levETIRAcetam (KEPPRA XR) 500 MG 24 hr tablet Take 3 tablets (1,500 mg total) by mouth in the morning and at bedtime. 540 tablet 4   Multiple Vitamin (MULTIVITAMIN) tablet Take 1 tablet by mouth daily.     VITAMIN D PO Take 1,000 Units by mouth.  Wheat Dextrin (BENEFIBER PO) Take by mouth.     No current facility-administered medications for this visit.     PHYSICAL EXAMINATION: Performance status (ECOG): 1 - Symptomatic but completely ambulatory  There were no vitals filed for this visit. Wt Readings from Last 3 Encounters:  05/16/21 194 lb 6.4 oz (88.2 kg)   03/13/21 187 lb (84.8 kg)  02/09/21 194 lb 12.8 oz (88.4 kg)   Physical Exam Vitals reviewed.  Constitutional:      Appearance: Normal appearance. She is obese.  Cardiovascular:     Rate and Rhythm: Normal rate and regular rhythm.     Pulses: Normal pulses.     Heart sounds: Normal heart sounds.  Pulmonary:     Effort: Pulmonary effort is normal.     Breath sounds: Normal breath sounds.  Chest:  Breasts:    Right: Normal. No inverted nipple, mass, nipple discharge, skin change or tenderness.     Left: Skin change (UIQ lumpectomy scar within normal limits) present. No inverted nipple, mass, nipple discharge or tenderness.  Abdominal:     Palpations: Abdomen is soft. There is no hepatomegaly, splenomegaly or mass.     Tenderness: There is no abdominal tenderness.  Lymphadenopathy:     Upper Body:     Right upper body: No supraclavicular, axillary or pectoral adenopathy.     Left upper body: No supraclavicular, axillary or pectoral adenopathy.  Neurological:     General: No focal deficit present.     Mental Status: She is alert and oriented to person, place, and time.  Psychiatric:        Mood and Affect: Mood normal.        Behavior: Behavior normal.    Breast Exam Chaperone: Thana Ates     LABORATORY DATA:  I have reviewed the data as listed CMP Latest Ref Rng & Units 06/07/2021 01/30/2021 08/16/2020  Glucose 70 - 99 mg/dL 94 106(H) 110(H)  BUN 8 - 23 mg/dL _0 Creatinine 0.44 - 1.00 mg/dL 0.81 0.71 0.77  Sodium 135 - 145 mmol/L 140 138 141  Potassium 3.5 - 5.1 mmol/L 4.9 4.7 5.0  Chloride 98 - 111 mmol/L 102 105 103  CO2 22 - 32 mmol/L _1 Calcium 8.9 - 10.3 mg/dL 10.0 9.4 10.1  Total Protein 6.5 - 8.1 g/dL 7.6 7.1 7.7  Total Bilirubin 0.3 - 1.2 mg/dL 0.5 0.5 0.3  Alkaline Phos 38 - 126 U/L 106 99 73  AST 15 - 41 U/L _2 ALT 0 - 44 U/L _3 No results found for: FKC127 Lab Results  Component Value Date   WBC 4.4 06/07/2021   HGB 14.7  06/07/2021   HCT 45.2 06/07/2021   MCV 95.8 06/07/2021   PLT 169 06/07/2021   NEUTROABS 2.7 06/07/2021    ASSESSMENT:  1. Stage I (PT1cPN0) left breast invasive ductal carcinoma (IDC): -Screening mammogram on 08/07/2018 once BI-RADS Category 0. - She had left breast mammogram on 08/26/2018 with additional views, and ultrasound showing 0.9 x 0.9 x 0.7 cm mass at 10 o'clock position.  She also had an ultrasound of the left axilla at that time which was negative. - Breast biopsy was done on 09/02/2018.  This showed IDC, grade 1, DCIS intermediate grade, ER/PR positive, her-2 negative. -She underwent left breast lumpectomy and sentinel lymph node biopsy on 10/01/2018. -Pathology showed 1.6 cm IDC, grade 1.  The deep margins were positive even though it  was resected to the pectoralis muscle.  0/1 lymph nodes was involved.  ER was 100% positive, PR 20% and HER-2/neu negative.  Ki 67 was 2%. - Because of the positive deep margins, Dr. Delton Coombes recommended radiation therapy.  She was also scheduled for another mammogram as they could not trace the radioactive seed. - She was referred to Dr. Francesca Jewett for radiation. Looks like she was seen by him in June 2020 and has another follow up in December 2020.  -She was started on antiestrogen therapy in 10/2018 with anastrozole 1 mg daily she will take this for 5 years. -Last mammogram on 08/16/2020 within normal limits.   2.osteopenia: - DEXA scan on 07/18/2015 showed T score of -1.7. - DEXA scan on 11/06/2018 showed a T score of -2.1.  Results are reviewed with the patient on 12/10/2018.     3.seizure disorder: -She will continue Keppra 1500 mg p.o. twice daily. -Her last seizure was on 12/07/2019.  No hospitalization required.  Neurologist aware.   PLAN:  1. Stage I (PT1cPN0) left breast invasive ductal carcinoma (IDC): - Physical examination today did not reveal any palpable masses.  Left breast upper inner quadrant lumpectomy scar is within normal  limits.  No palpable adenopathy. - She is tolerating anastrozole without any major hot flashes or musculoskeletal symptoms. - Reviewed labs from 06/07/2021 which showed normal LFTs.  Calcium was normal.  CBC was normal. - We will arrange for mammogram after 08/16/2021. - RTC 6 months for follow-up with labs and exam.   2. Osteoporosis: - Reviewed results of DEXA scan from 06/07/2021 which showed T score -2.5.  We discussed her new diagnosis of osteoporosis. - Discussed options for management of osteoporosis including weekly bisphosphonate orally versus Prolia every 6 months. - Discussed side effects including but not limited to hypocalcemia, rare chance of osteonecrosis of the jaw. - Patient would like to proceed with Prolia every 6 months.  Plan to repeat DEXA scan in 3 years. - Vitamin D level is 31.4.  Continue calcium and vitamin D supplements. - She will continue close dental follow-ups.  She has 1 in October.  Breast Cancer therapy associated bone loss: I have recommended calcium, Vitamin D and weight bearing exercises.  Orders placed this encounter:  No orders of the defined types were placed in this encounter.   The patient has a good understanding of the overall plan. She agrees with it. She will call with any problems that may develop before the next visit here.  Derek Jack, MD [place] [phone]   I, Thana Ates, am acting as a scribe for Dr. Derek Jack.  I, Derek Jack MD, have reviewed the above documentation for accuracy and completeness, and I agree with the above.

## 2021-06-12 ENCOUNTER — Inpatient Hospital Stay (HOSPITAL_COMMUNITY): Payer: Medicare Other | Admitting: Hematology

## 2021-06-12 ENCOUNTER — Other Ambulatory Visit (HOSPITAL_COMMUNITY): Payer: Self-pay | Admitting: Hematology

## 2021-06-12 ENCOUNTER — Other Ambulatory Visit: Payer: Self-pay

## 2021-06-12 VITALS — BP 101/60 | HR 58 | Temp 96.8°F | Resp 18 | Wt 194.9 lb

## 2021-06-12 DIAGNOSIS — Z9889 Other specified postprocedural states: Secondary | ICD-10-CM

## 2021-06-12 DIAGNOSIS — M8000XA Age-related osteoporosis with current pathological fracture, unspecified site, initial encounter for fracture: Secondary | ICD-10-CM | POA: Diagnosis not present

## 2021-06-12 DIAGNOSIS — C50212 Malignant neoplasm of upper-inner quadrant of left female breast: Secondary | ICD-10-CM

## 2021-06-12 DIAGNOSIS — Z170 Estrogen receptor positive status [ER+]: Secondary | ICD-10-CM | POA: Diagnosis not present

## 2021-06-12 DIAGNOSIS — M810 Age-related osteoporosis without current pathological fracture: Secondary | ICD-10-CM

## 2021-06-12 DIAGNOSIS — Z79811 Long term (current) use of aromatase inhibitors: Secondary | ICD-10-CM | POA: Diagnosis not present

## 2021-06-12 DIAGNOSIS — Z79899 Other long term (current) drug therapy: Secondary | ICD-10-CM | POA: Diagnosis not present

## 2021-06-12 NOTE — Patient Instructions (Addendum)
[place] at [facility] Discharge Instructions  You were seen today by Dr. Delton Coombes. He went over your recent results. You will be scheduled for a mammogram after 08/16/2020. At your next dentist appointment, remember to inform them that you will be beginning an injection for your osteoporosis called Prolia. Dr. Delton Coombes will see you back in 6 months for labs and follow up.   Thank you for choosing [place] at [facility] to provide your oncology and hematology care.  To afford each patient quality time with our provider, please arrive at least 15 minutes before your scheduled appointment time.   If you have a lab appointment with the [place] please come in thru the Main Entrance and check in at the main information desk  You need to re-schedule your appointment should you arrive 10 or more minutes late.  We strive to give you quality time with our providers, and arriving late affects you and other patients whose appointments are after yours.  Also, if you no show three or more times for appointments you may be dismissed from the clinic at the providers discretion.     Again, thank you for choosing [facility].  Our hope is that these requests will decrease the amount of time that you wait before being seen by our physicians.       _____________________________________________________________  Should you have questions after your visit to [facility], please contact our office at [phone] between the hours of 8:00 a.m. and 4:30 p.m.  Voicemails left after 4:00 p.m. will not be returned until the following business day.  For prescription refill requests, have your pharmacy contact our office and allow 72 hours.    Cancer Center Support Programs:   > Cancer Support Group  2nd Tuesday of the month 1pm-2pm, Journey Room

## 2021-06-28 DIAGNOSIS — C50412 Malignant neoplasm of upper-outer quadrant of left female breast: Secondary | ICD-10-CM | POA: Diagnosis not present

## 2021-06-28 DIAGNOSIS — Z170 Estrogen receptor positive status [ER+]: Secondary | ICD-10-CM | POA: Diagnosis not present

## 2021-07-18 ENCOUNTER — Telehealth: Payer: Self-pay | Admitting: Diagnostic Neuroimaging

## 2021-07-18 DIAGNOSIS — G40109 Localization-related (focal) (partial) symptomatic epilepsy and epileptic syndromes with simple partial seizures, not intractable, without status epilepticus: Secondary | ICD-10-CM

## 2021-07-18 DIAGNOSIS — W19XXXA Unspecified fall, initial encounter: Secondary | ICD-10-CM

## 2021-07-18 NOTE — Telephone Encounter (Signed)
Patient son Darlene Moore called stating that on 07/11/21 his mother had a seizure and hit her head but she refused to go to the hospital. They are wondering if the doctor will order a CT or a MRI and she would like to have it at [facility].

## 2021-07-20 ENCOUNTER — Other Ambulatory Visit: Payer: Self-pay | Admitting: Diagnostic Neuroimaging

## 2021-07-20 DIAGNOSIS — G40109 Localization-related (focal) (partial) symptomatic epilepsy and epileptic syndromes with simple partial seizures, not intractable, without status epilepticus: Secondary | ICD-10-CM

## 2021-07-20 NOTE — Telephone Encounter (Signed)
Per VO from Dr. Leta Baptist, ok to order CT of head w/wo.   I have placed order for processing.

## 2021-07-20 NOTE — Progress Notes (Signed)
Orders Placed This Encounter  Procedures   CT HEAD WO CONTRAST (5MM)   Breakthrough seizure and head trauma.   Penni Bombard, MD 17/08/7870, 8:36 PM Certified in Neurology, Neurophysiology and Neuroimaging  [facility] [address], [place], [place] [address] [phone]

## 2021-07-20 NOTE — Telephone Encounter (Signed)
Noted, Health team no auth req. Order sent to [facility] cone they will reach out to the patient to schedule at [facility].

## 2021-07-31 DIAGNOSIS — Z23 Encounter for immunization: Secondary | ICD-10-CM | POA: Diagnosis not present

## 2021-08-07 ENCOUNTER — Other Ambulatory Visit (HOSPITAL_COMMUNITY): Payer: Self-pay

## 2021-08-07 DIAGNOSIS — C50212 Malignant neoplasm of upper-inner quadrant of left female breast: Secondary | ICD-10-CM

## 2021-08-07 MED ORDER — ANASTROZOLE 1 MG PO TABS: ORAL_TABLET | ORAL | 4 refills | Status: DC

## 2021-08-08 ENCOUNTER — Other Ambulatory Visit (HOSPITAL_COMMUNITY): Payer: Self-pay | Admitting: *Deleted

## 2021-08-08 DIAGNOSIS — C50212 Malignant neoplasm of upper-inner quadrant of left female breast: Secondary | ICD-10-CM

## 2021-08-08 MED ORDER — ANASTROZOLE 1 MG PO TABS: ORAL_TABLET | ORAL | 4 refills | Status: DC

## 2021-08-08 NOTE — Telephone Encounter (Signed)
Anastrozole prescription sent to [facility] order per patient request.  Per Dr. Tomie China note, patient is to continue on this medication.

## 2021-08-14 DIAGNOSIS — Z760 Encounter for issue of repeat prescription: Secondary | ICD-10-CM | POA: Diagnosis not present

## 2021-08-22 ENCOUNTER — Encounter (HOSPITAL_COMMUNITY): Payer: Medicare Other

## 2021-08-22 ENCOUNTER — Ambulatory Visit (HOSPITAL_COMMUNITY): Payer: Medicare Other

## 2021-08-30 ENCOUNTER — Other Ambulatory Visit: Payer: Self-pay

## 2021-08-30 ENCOUNTER — Ambulatory Visit (HOSPITAL_COMMUNITY)
Admission: RE | Admit: 2021-08-30 | Discharge: 2021-08-30 | Disposition: A | Discharge status: Home / Self Care | Payer: Medicare Other | Source: Ambulatory Visit | Attending: Diagnostic Neuroimaging | Admitting: Diagnostic Neuroimaging

## 2021-08-30 DIAGNOSIS — G40109 Localization-related (focal) (partial) symptomatic epilepsy and epileptic syndromes with simple partial seizures, not intractable, without status epilepticus: Secondary | ICD-10-CM | POA: Diagnosis present

## 2021-08-30 DIAGNOSIS — R569 Unspecified convulsions: Secondary | ICD-10-CM | POA: Diagnosis not present

## 2021-09-12 ENCOUNTER — Encounter (HOSPITAL_COMMUNITY): Payer: Medicare Other

## 2021-09-12 ENCOUNTER — Ambulatory Visit (HOSPITAL_COMMUNITY): Payer: Medicare Other

## 2021-09-28 ENCOUNTER — Other Ambulatory Visit: Payer: Self-pay

## 2021-09-28 ENCOUNTER — Ambulatory Visit (HOSPITAL_COMMUNITY)
Admission: RE | Admit: 2021-09-28 | Discharge: 2021-09-28 | Disposition: A | Discharge status: Home / Self Care | Payer: Medicare Other | Source: Ambulatory Visit | Attending: Hematology | Admitting: Hematology

## 2021-09-28 DIAGNOSIS — C50212 Malignant neoplasm of upper-inner quadrant of left female breast: Secondary | ICD-10-CM | POA: Diagnosis not present

## 2021-09-28 DIAGNOSIS — Z9889 Other specified postprocedural states: Secondary | ICD-10-CM

## 2021-09-28 DIAGNOSIS — Z170 Estrogen receptor positive status [ER+]: Secondary | ICD-10-CM | POA: Diagnosis not present

## 2021-09-28 DIAGNOSIS — R922 Inconclusive mammogram: Secondary | ICD-10-CM | POA: Diagnosis not present

## 2021-09-28 DIAGNOSIS — M8000XA Age-related osteoporosis with current pathological fracture, unspecified site, initial encounter for fracture: Secondary | ICD-10-CM | POA: Diagnosis not present

## 2021-10-09 ENCOUNTER — Other Ambulatory Visit (HOSPITAL_COMMUNITY): Payer: Self-pay | Admitting: Hematology

## 2021-10-09 DIAGNOSIS — Z170 Estrogen receptor positive status [ER+]: Secondary | ICD-10-CM

## 2021-10-09 DIAGNOSIS — C50212 Malignant neoplasm of upper-inner quadrant of left female breast: Secondary | ICD-10-CM

## 2021-10-10 ENCOUNTER — Encounter (HOSPITAL_COMMUNITY): Payer: Self-pay | Admitting: Hematology

## 2021-11-14 DIAGNOSIS — E785 Hyperlipidemia, unspecified: Secondary | ICD-10-CM | POA: Diagnosis not present

## 2021-11-14 DIAGNOSIS — R7303 Prediabetes: Secondary | ICD-10-CM | POA: Diagnosis not present

## 2021-11-21 DIAGNOSIS — K5900 Constipation, unspecified: Secondary | ICD-10-CM | POA: Diagnosis not present

## 2021-11-21 DIAGNOSIS — R011 Cardiac murmur, unspecified: Secondary | ICD-10-CM | POA: Diagnosis not present

## 2021-11-21 DIAGNOSIS — J019 Acute sinusitis, unspecified: Secondary | ICD-10-CM | POA: Diagnosis not present

## 2021-11-21 DIAGNOSIS — E782 Mixed hyperlipidemia: Secondary | ICD-10-CM | POA: Diagnosis not present

## 2021-11-21 DIAGNOSIS — M810 Age-related osteoporosis without current pathological fracture: Secondary | ICD-10-CM | POA: Diagnosis not present

## 2021-11-21 DIAGNOSIS — Z0001 Encounter for general adult medical examination with abnormal findings: Secondary | ICD-10-CM | POA: Diagnosis not present

## 2021-11-21 DIAGNOSIS — R7303 Prediabetes: Secondary | ICD-10-CM | POA: Diagnosis not present

## 2021-11-21 DIAGNOSIS — Z853 Personal history of malignant neoplasm of breast: Secondary | ICD-10-CM | POA: Diagnosis not present

## 2021-12-07 ENCOUNTER — Inpatient Hospital Stay (HOSPITAL_COMMUNITY): Payer: Medicare Other

## 2021-12-07 ENCOUNTER — Inpatient Hospital Stay (HOSPITAL_COMMUNITY): Payer: Medicare Other | Attending: Hematology

## 2021-12-07 DIAGNOSIS — C50212 Malignant neoplasm of upper-inner quadrant of left female breast: Secondary | ICD-10-CM | POA: Insufficient documentation

## 2021-12-07 DIAGNOSIS — M810 Age-related osteoporosis without current pathological fracture: Secondary | ICD-10-CM | POA: Insufficient documentation

## 2021-12-07 DIAGNOSIS — E538 Deficiency of other specified B group vitamins: Secondary | ICD-10-CM | POA: Insufficient documentation

## 2021-12-07 DIAGNOSIS — Z170 Estrogen receptor positive status [ER+]: Secondary | ICD-10-CM | POA: Insufficient documentation

## 2021-12-11 ENCOUNTER — Other Ambulatory Visit: Payer: Self-pay | Admitting: Diagnostic Neuroimaging

## 2021-12-11 DIAGNOSIS — G40109 Localization-related (focal) (partial) symptomatic epilepsy and epileptic syndromes with simple partial seizures, not intractable, without status epilepticus: Secondary | ICD-10-CM

## 2021-12-12 ENCOUNTER — Telehealth: Payer: Self-pay | Admitting: Diagnostic Neuroimaging

## 2021-12-12 DIAGNOSIS — G40109 Localization-related (focal) (partial) symptomatic epilepsy and epileptic syndromes with simple partial seizures, not intractable, without status epilepticus: Secondary | ICD-10-CM

## 2021-12-12 MED ORDER — LACOSAMIDE 150 MG PO TABS: 150.0000 mg | ORAL_TABLET | Freq: Two times a day (BID) | ORAL | 1 refills | Status: DC

## 2021-12-12 NOTE — Telephone Encounter (Signed)
Pt states she has about 30 days worth of Lacosamide 150 MG TABS  her but she has been told by [facility]  that they need a new prescription for her Lacosamide 150 MG TABS ?

## 2021-12-12 NOTE — Telephone Encounter (Signed)
Meds ordered this encounter  ?Medications  ? Lacosamide 150 MG TABS  ?  Sig: Take 1 tablet (150 mg total) by mouth in the morning and at bedtime. Changed to 90 day supply  ?  Dispense:  180 tablet  ?  Refill:  1  ?  Temporary Rx until mail order arrives.  ? ? ?Penni Bombard, MD 6/68/1594, 7:07 PM ?Certified in Neurology, Neurophysiology and Neuroimaging ? ?[facility] ?[address], [address] ?[place], [place] [address] ?([phone] ? ? ?

## 2021-12-12 NOTE — Telephone Encounter (Signed)
Rx has been sent  

## 2021-12-12 NOTE — Telephone Encounter (Signed)
[place] drug registry has been verified. Last refill was 09/08/21 refills on file through registry cannot be used at pharmacy. Last appt was 03/13/21.  ?

## 2021-12-13 ENCOUNTER — Ambulatory Visit (HOSPITAL_COMMUNITY): Payer: Medicare Other | Admitting: Hematology

## 2021-12-14 ENCOUNTER — Ambulatory Visit (HOSPITAL_COMMUNITY): Payer: Medicare Other | Admitting: Hematology

## 2021-12-19 ENCOUNTER — Inpatient Hospital Stay (HOSPITAL_COMMUNITY): Payer: Medicare Other

## 2021-12-19 DIAGNOSIS — M810 Age-related osteoporosis without current pathological fracture: Secondary | ICD-10-CM | POA: Diagnosis not present

## 2021-12-19 DIAGNOSIS — M8000XA Age-related osteoporosis with current pathological fracture, unspecified site, initial encounter for fracture: Secondary | ICD-10-CM

## 2021-12-19 DIAGNOSIS — Z170 Estrogen receptor positive status [ER+]: Secondary | ICD-10-CM | POA: Diagnosis not present

## 2021-12-19 DIAGNOSIS — E538 Deficiency of other specified B group vitamins: Secondary | ICD-10-CM | POA: Diagnosis not present

## 2021-12-19 DIAGNOSIS — C50212 Malignant neoplasm of upper-inner quadrant of left female breast: Secondary | ICD-10-CM | POA: Diagnosis not present

## 2021-12-19 LAB — CBC WITH DIFFERENTIAL/PLATELET
Abs Immature Granulocytes: 0.01 10*3/uL (ref 0.00–0.07)
Basophils Absolute: 0 10*3/uL (ref 0.0–0.1)
Basophils Relative: 1 %
Eosinophils Absolute: 0.1 10*3/uL (ref 0.0–0.5)
Eosinophils Relative: 2 %
HCT: 40.4 % (ref 36.0–46.0)
Hemoglobin: 13.4 g/dL (ref 12.0–15.0)
Immature Granulocytes: 0 %
Lymphocytes Relative: 20 %
Lymphs Abs: 1 10*3/uL (ref 0.7–4.0)
MCH: 31.3 pg (ref 26.0–34.0)
MCHC: 33.2 g/dL (ref 30.0–36.0)
MCV: 94.4 fL (ref 80.0–100.0)
Monocytes Absolute: 0.3 10*3/uL (ref 0.1–1.0)
Monocytes Relative: 6 %
Neutro Abs: 3.3 10*3/uL (ref 1.7–7.7)
Neutrophils Relative %: 71 %
Platelets: 158 10*3/uL (ref 150–400)
RBC: 4.28 MIL/uL (ref 3.87–5.11)
RDW: 12.8 % (ref 11.5–15.5)
WBC: 4.8 10*3/uL (ref 4.0–10.5)
nRBC: 0 % (ref 0.0–0.2)

## 2021-12-19 LAB — COMPREHENSIVE METABOLIC PANEL
ALT: 16 U/L (ref 0–44)
AST: 21 U/L (ref 15–41)
Albumin: 4.3 g/dL (ref 3.5–5.0)
Alkaline Phosphatase: 97 U/L (ref 38–126)
Anion gap: 7 (ref 5–15)
BUN: 19 mg/dL (ref 8–23)
CO2: 26 mmol/L (ref 22–32)
Calcium: 9.2 mg/dL (ref 8.9–10.3)
Chloride: 105 mmol/L (ref 98–111)
Creatinine, Ser: 0.77 mg/dL (ref 0.44–1.00)
GFR, Estimated: >60 mL/min (ref >60)
Glucose, Bld: 126 mg/dL — ABNORMAL HIGH (ref 70–99)
Potassium: 3.7 mmol/L (ref 3.5–5.1)
Sodium: 138 mmol/L (ref 135–145)
Total Bilirubin: 0.4 mg/dL (ref 0.3–1.2)
Total Protein: 7.3 g/dL (ref 6.5–8.1)

## 2021-12-19 LAB — VITAMIN B12: Vitamin B-12: 570 pg/mL (ref 180–914)

## 2021-12-19 LAB — VITAMIN D 25 HYDROXY (VIT D DEFICIENCY, FRACTURES): Vit D, 25-Hydroxy: 39.09 ng/mL (ref 30–100)

## 2021-12-28 ENCOUNTER — Inpatient Hospital Stay (HOSPITAL_COMMUNITY): Payer: Medicare Other

## 2021-12-28 ENCOUNTER — Inpatient Hospital Stay (HOSPITAL_COMMUNITY): Payer: Medicare Other | Attending: Hematology | Admitting: Hematology

## 2021-12-28 ENCOUNTER — Other Ambulatory Visit (HOSPITAL_COMMUNITY): Payer: Self-pay | Admitting: *Deleted

## 2021-12-28 VITALS — BP 110/61 | HR 71 | Temp 98.1°F | Resp 18 | Ht 64.96 in | Wt 203.5 lb

## 2021-12-28 DIAGNOSIS — C50212 Malignant neoplasm of upper-inner quadrant of left female breast: Secondary | ICD-10-CM | POA: Diagnosis not present

## 2021-12-28 DIAGNOSIS — M810 Age-related osteoporosis without current pathological fracture: Secondary | ICD-10-CM | POA: Diagnosis not present

## 2021-12-28 DIAGNOSIS — Z79811 Long term (current) use of aromatase inhibitors: Secondary | ICD-10-CM | POA: Insufficient documentation

## 2021-12-28 DIAGNOSIS — Z170 Estrogen receptor positive status [ER+]: Secondary | ICD-10-CM | POA: Diagnosis not present

## 2021-12-28 DIAGNOSIS — M8000XA Age-related osteoporosis with current pathological fracture, unspecified site, initial encounter for fracture: Secondary | ICD-10-CM

## 2021-12-28 NOTE — Progress Notes (Signed)
? ?[facility] ?[address] ?[place], [place] [address] ? ? ?Patient Care Team: ?Celene Squibb, MD as PCP - General (Internal Medicine) ? ?SUMMARY OF ONCOLOGIC HISTORY: ?Oncology History  ?Breast cancer of upper-inner quadrant of left female breast ([place])  ?09/10/2018 Initial Diagnosis  ? Breast cancer of upper-inner quadrant of left female breast [facility]) ?  ?11/14/2018 Genetic Testing  ? Negative genetic testing on the multi-cancer panel.  The Multi-Gene Panel offered by [facility] includes sequencing and/or deletion duplication testing of the following 84 genes: AIP, ALK, APC, ATM, AXIN2,BAP1,  BARD1, BLM, BMPR1A, BRCA1, BRCA2, BRIP1, CASR, CDC73, CDH1, CDK4, CDKN1B, CDKN1C, CDKN2A (p14ARF), CDKN2A (p16INK4a), CEBPA, CHEK2, CTNNA1, DICER1, DIS3L2, EGFR (c.2369C>T, p.Thr790Met variant only), EPCAM (Deletion/duplication testing only), FH, FLCN, GATA2, GPC3, GREM1 (Promoter region deletion/duplication testing only), HOXB13 (c.251G>A, p.Gly84Glu), HRAS, KIT, MAX, MEN1, MET, MITF (c.952G>A, p.Glu318Lys variant only), MLH1, MSH2, MSH3, MSH6, MUTYH, NBN, NF1, NF2, NTHL1, PALB2, PDGFRA, PHOX2B, PMS2, POLD1, POLE, POT1, PRKAR1A, PTCH1, PTEN, RAD50, RAD51C, RAD51D, RB1, RECQL4, RET, RUNX1, SDHAF2, SDHA (sequence changes only), SDHB, SDHC, SDHD, SMAD4, SMARCA4, SMARCB1, SMARCE1, STK11, SUFU, TERC, TERT, TMEM127, TP53, TSC1, TSC2, VHL, WRN and WT1.  The report date is November 14, 2018. ?  ? ? ?CHIEF COMPLIANT: Follow-up of left upper inner quadrant breast cancer ? ? ?INTERVAL HISTORY: [name] is a 76 y.o. female here today for follow up of her left upper inner quadrant breast cancer. Her last visit was on 06/12/2021.  ? ?Today she reports feeling good. She reports occasional hot flashes which are tolerable. She has not started Prolia. She denies history of dental issues. She is taking calcium and vitamin D.  ? ?REVIEW OF SYSTEMS:   ?Review of Systems  ?Constitutional:  Negative for appetite change and  fatigue.  ?Endocrine: Positive for hot flashes.  ?Neurological:  Positive for dizziness.  ?Psychiatric/Behavioral:  Positive for sleep disturbance.   ?All other systems reviewed and are negative. ? ?I have reviewed the past medical history, past surgical history, social history and family history with the patient and they are unchanged from previous note. ? ? ?ALLERGIES:   ?has No Known Allergies. ? ? ?MEDICATIONS:  ?Current Outpatient Medications  ?Medication Sig Dispense Refill  ? anastrozole (ARIMIDEX) 1 MG tablet TAKE 1 TABLET BY MOUTH DAILY 90 tablet 3  ? atorvastatin (LIPITOR) 20 MG tablet Take 20 mg by mouth daily.    ? Calcium Carbonate (CALCIUM 600 PO) Take by mouth 2 (two) times daily.    ? ibuprofen (ADVIL,MOTRIN) 200 MG tablet Take 400 mg by mouth every 8 (eight) hours as needed for mild pain or moderate pain.    ? Lacosamide 150 MG TABS Take 1 tablet (150 mg total) by mouth in the morning and at bedtime. Changed to 90 day supply 180 tablet 1  ? lamoTRIgine (LAMICTAL) 25 MG tablet Take 2 tablets (50 mg total) by mouth 2 (two) times daily. 360 tablet 4  ? levETIRAcetam (KEPPRA XR) 500 MG 24 hr tablet Take 3 tablets (1,500 mg total) by mouth in the morning and at bedtime. 540 tablet 4  ? Multiple Vitamin (MULTIVITAMIN) tablet Take 1 tablet by mouth daily.    ? sertraline (ZOLOFT) 25 MG tablet Take 25 mg by mouth daily.    ? VITAMIN D PO Take 1,000 Units by mouth.    ? Wheat Dextrin (BENEFIBER PO) Take by mouth.    ? ?No current facility-administered medications for this visit.  ? ? ? ?PHYSICAL  EXAMINATION: ?Performance status (ECOG): 1 - Symptomatic but completely ambulatory ? ?Vitals:  ? 12/28/21 1202  ?BP: 110/61  ?Pulse: 71  ?Resp: 18  ?Temp: 98.1 ?F (36.7 ?C)  ?SpO2: 98%  ? ?Wt Readings from Last 3 Encounters:  ?12/28/21 203 lb 7.8 oz (92.3 kg)  ?06/12/21 194 lb 14.4 oz (88.4 kg)  ?05/16/21 194 lb 6.4 oz (88.2 kg)  ? ?Physical Exam ?Vitals reviewed.  ?Constitutional:   ?   Appearance: Normal  appearance.  ?Cardiovascular:  ?   Rate and Rhythm: Normal rate and regular rhythm.  ?   Pulses: Normal pulses.  ?   Heart sounds: Normal heart sounds.  ?Pulmonary:  ?   Effort: Pulmonary effort is normal.  ?   Breath sounds: Normal breath sounds.  ?Chest:  ?Breasts: ?   Right: No swelling, bleeding, inverted nipple, mass, nipple discharge, skin change or tenderness.  ?   Left: No swelling, bleeding, inverted nipple, mass, nipple discharge, skin change (UIQ lumpecomtomy scar WNL) or tenderness.  ?Abdominal:  ?   Palpations: Abdomen is soft. There is no hepatomegaly, splenomegaly or mass.  ?   Tenderness: There is no abdominal tenderness.  ?Musculoskeletal:  ?   Right lower leg: No edema.  ?   Left lower leg: No edema.  ?Lymphadenopathy:  ?   Upper Body:  ?   Right upper body: No supraclavicular or axillary adenopathy.  ?   Left upper body: No supraclavicular or axillary adenopathy.  ?Neurological:  ?   General: No focal deficit present.  ?   Mental Status: She is alert and oriented to person, place, and time.  ?Psychiatric:     ?   Mood and Affect: Mood normal.     ?   Behavior: Behavior normal.  ? ? ?Breast Exam Chaperone: Thana Ates   ? ? ?LABORATORY DATA:  ?I have reviewed the data as listed ? ?  Latest Ref Rng & Units 12/19/2021  ? 12:30 PM 06/07/2021  ? 11:22 AM 01/30/2021  ? 11:00 AM  ?CMP  ?Glucose 70 - 99 mg/dL 126   94   106    ?BUN 8 - 23 mg/dL _0 ?Creatinine 0.44 - 1.00 mg/dL 0.77   0.81   0.71    ?Sodium 135 - 145 mmol/L 138   140   138    ?Potassium 3.5 - 5.1 mmol/L 3.7   4.9   4.7    ?Chloride 98 - 111 mmol/L 105   102   105    ?CO2 22 - 32 mmol/L _1 ?Calcium 8.9 - 10.3 mg/dL 9.2   10.0   9.4    ?Total Protein 6.5 - 8.1 g/dL 7.3   7.6   7.1    ?Total Bilirubin 0.3 - 1.2 mg/dL 0.4   0.5   0.5    ?Alkaline Phos 38 - 126 U/L 97   106   99    ?AST 15 - 41 U/L _2 ?ALT 0 - 44 U/L _3 ? ?No results found for: EYC144 ?Lab Results  ?Component Value Date  ? WBC  4.8 12/19/2021  ? HGB 13.4 12/19/2021  ? HCT 40.4 12/19/2021  ? MCV 94.4 12/19/2021  ? PLT 158 12/19/2021  ? NEUTROABS 3.3 12/19/2021  ? ? ?ASSESSMENT:  ?1. Stage  I (PT1cPN0) left breast invasive ductal carcinoma (IDC): ?-Screening mammogram on 08/07/2018 once BI-RADS Category 0. ?- She had left breast mammogram on 08/26/2018 with additional views, and ultrasound showing 0.9 x 0.9 x 0.7 cm mass at 10 o'clock position.  She also had an ultrasound of the left axilla at that time which was negative. ?- Breast biopsy was done on 09/02/2018.  This showed IDC, grade 1, DCIS intermediate grade, ER/PR positive, her-2 negative. ?-She underwent left breast lumpectomy and sentinel lymph node biopsy on 10/01/2018. ?-Pathology showed 1.6 cm IDC, grade 1.  The deep margins were positive even though it was resected to the pectoralis muscle.  0/1 lymph nodes was involved.  ER was 100% positive, PR 20% and HER-2/neu negative.  Ki 67 was 2%. ?- Because of the positive deep margins, Dr. Delton Coombes recommended radiation therapy.  She was also scheduled for another mammogram as they could not trace the radioactive seed. ?- She was referred to Dr. Francesca Jewett for radiation. Looks like she was seen by him in June 2020 and has another follow up in December 2020.  ?- Anastrozole started in February 2020. ? ?  ?2.osteopenia: ?- DEXA scan on 07/18/2015 showed T score of -1.7. ?- DEXA scan on 11/06/2018 showed a T score of -2.1.  Results are reviewed with the patient on 12/10/2018. ?  ?  ?3.seizure disorder: ?-She will continue Keppra 1500 mg p.o. twice daily. ?-Her last seizure was on 12/07/2019.  No hospitalization required.  Neurologist aware. ? ? ?PLAN:  ?1. Stage I (PT1cPN0) left breast invasive ductal carcinoma (IDC): ?- Physical examination today did not reveal any palpable masses.  Left breast upper inner quadrant lumpectomy scar is within normal limits.  No palpable adenopathy or masses in bilateral breasts. ?- Reviewed labs today which  showed normal LFTs.  CBC was normal.  B12, vitamin D were normal. ?- She has some occasional hot flashes but denies any arthralgias.  She gained about 7 pounds. ?- Continue anastrozole for a total of 5 years. ?- Reviewed

## 2021-12-28 NOTE — Progress Notes (Signed)
Patient is taking Anastrozole as prescribed.  She has not missed any doses and reports no side effects at this time.   

## 2021-12-28 NOTE — Patient Instructions (Signed)
[place] at [facility] ?Discharge Instructions ? ? ?You were seen and examined today by Dr. Delton Coombes. ? ?He reviewed your lab work which is normal/stable. ? ?Let your dentist know that you will be receiving an injection called Prolia. It is given once every 6 months.  This shot helps strengthen your bones. ? ?Return as scheduled in 6 months.  ? ? ?Thank you for choosing [place] at [facility] to provide your oncology and hematology care.  To afford each patient quality time with our provider, please arrive at least 15 minutes before your scheduled appointment time.  ? ?If you have a lab appointment with the [facility] please come in thru the Main Entrance and check in at the main information desk. ? ?You need to re-schedule your appointment should you arrive 10 or more minutes late.  We strive to give you quality time with our providers, and arriving late affects you and other patients whose appointments are after yours.  Also, if you no show three or more times for appointments you may be dismissed from the clinic at the providers discretion.     ?Again, thank you for choosing [facility].  Our hope is that these requests will decrease the amount of time that you wait before being seen by our physicians.       ?_____________________________________________________________ ? ?Should you have questions after your visit to [facility], please contact our office at [phone] and follow the prompts.  Our office hours are 8:00 a.m. and 4:30 p.m. Monday - Friday.  Please note that voicemails left after 4:00 p.m. may not be returned until the following business day.  We are closed weekends and major holidays.  You do have access to a nurse 24-7, just call the main number to the clinic [phone] and do not press any options, hold on the line and a nurse will answer the phone.   ? ?For prescription refill requests, have your pharmacy  contact our office and allow 72 hours.   ? ?Due to Covid, you will need to wear a mask upon entering the hospital. If you do not have a mask, a mask will be given to you at the Main Entrance upon arrival. For doctor visits, patients may have 1 support person age 64 or older with them. For treatment visits, patients can not have anyone with them due to social distancing guidelines and our immunocompromised population.  ? ?   ?

## 2022-01-02 ENCOUNTER — Other Ambulatory Visit: Payer: Self-pay

## 2022-01-10 ENCOUNTER — Inpatient Hospital Stay (HOSPITAL_COMMUNITY): Payer: Medicare Other

## 2022-01-10 VITALS — BP 129/67 | HR 60 | Temp 97.7°F | Resp 18

## 2022-01-10 DIAGNOSIS — Z170 Estrogen receptor positive status [ER+]: Secondary | ICD-10-CM

## 2022-01-10 DIAGNOSIS — C50212 Malignant neoplasm of upper-inner quadrant of left female breast: Secondary | ICD-10-CM | POA: Diagnosis not present

## 2022-01-10 DIAGNOSIS — M8000XA Age-related osteoporosis with current pathological fracture, unspecified site, initial encounter for fracture: Secondary | ICD-10-CM

## 2022-01-10 DIAGNOSIS — M810 Age-related osteoporosis without current pathological fracture: Secondary | ICD-10-CM | POA: Diagnosis not present

## 2022-01-10 DIAGNOSIS — Z79811 Long term (current) use of aromatase inhibitors: Secondary | ICD-10-CM | POA: Diagnosis not present

## 2022-01-10 LAB — COMPREHENSIVE METABOLIC PANEL
ALT: 19 U/L (ref 0–44)
AST: 22 U/L (ref 15–41)
Albumin: 4.2 g/dL (ref 3.5–5.0)
Alkaline Phosphatase: 106 U/L (ref 38–126)
Anion gap: 8 (ref 5–15)
BUN: 25 mg/dL — ABNORMAL HIGH (ref 8–23)
CO2: 28 mmol/L (ref 22–32)
Calcium: 10 mg/dL (ref 8.9–10.3)
Chloride: 105 mmol/L (ref 98–111)
Creatinine, Ser: 0.95 mg/dL (ref 0.44–1.00)
GFR, Estimated: >60 mL/min (ref >60)
Glucose, Bld: 96 mg/dL (ref 70–99)
Potassium: 4.3 mmol/L (ref 3.5–5.1)
Sodium: 141 mmol/L (ref 135–145)
Total Bilirubin: 0.4 mg/dL (ref 0.3–1.2)
Total Protein: 7.6 g/dL (ref 6.5–8.1)

## 2022-01-10 LAB — CBC WITH DIFFERENTIAL/PLATELET
Abs Immature Granulocytes: 0.01 10*3/uL (ref 0.00–0.07)
Basophils Absolute: 0.1 10*3/uL (ref 0.0–0.1)
Basophils Relative: 1 %
Eosinophils Absolute: 0.1 10*3/uL (ref 0.0–0.5)
Eosinophils Relative: 2 %
HCT: 43.9 % (ref 36.0–46.0)
Hemoglobin: 14.1 g/dL (ref 12.0–15.0)
Immature Granulocytes: 0 %
Lymphocytes Relative: 26 %
Lymphs Abs: 1.1 10*3/uL (ref 0.7–4.0)
MCH: 30 pg (ref 26.0–34.0)
MCHC: 32.1 g/dL (ref 30.0–36.0)
MCV: 93.4 fL (ref 80.0–100.0)
Monocytes Absolute: 0.5 10*3/uL (ref 0.1–1.0)
Monocytes Relative: 10 %
Neutro Abs: 2.7 10*3/uL (ref 1.7–7.7)
Neutrophils Relative %: 61 %
Platelets: 162 10*3/uL (ref 150–400)
RBC: 4.7 MIL/uL (ref 3.87–5.11)
RDW: 12.7 % (ref 11.5–15.5)
WBC: 4.4 10*3/uL (ref 4.0–10.5)
nRBC: 0 % (ref 0.0–0.2)

## 2022-01-10 MED ORDER — ZOLEDRONIC ACID 4 MG/100ML IV SOLN
4.0000 mg | Freq: Once | INTRAVENOUS | Status: AC
  Administered 2022-01-10: 4 mg via INTRAVENOUS
  Filled 2022-01-10: qty 100

## 2022-01-10 MED ORDER — SODIUM CHLORIDE 0.9 % IV SOLN: Freq: Once | INTRAVENOUS | Status: AC

## 2022-01-10 NOTE — Progress Notes (Signed)
Patient presents today for Zometa infusion per providers order.  Vital signs and labs within parameters for treatment.  Patient has no new complaints at this time. ? ?Peripheral IV started and blood return noted pre and post infusion. ? ?Zometa infusion given today per MD orders.  Stable during infusion without adverse affects.  Vital signs stable.  No complaints at this time.  Discharge from clinic ambulatory in stable condition.  Alert and oriented X 3.  Follow up with [facility] as scheduled.  ?

## 2022-02-01 DIAGNOSIS — H25813 Combined forms of age-related cataract, bilateral: Secondary | ICD-10-CM | POA: Diagnosis not present
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
## 2022-03-14 ENCOUNTER — Other Ambulatory Visit: Payer: Self-pay | Admitting: Diagnostic Neuroimaging

## 2022-03-19 ENCOUNTER — Ambulatory Visit: Payer: PPO | Admitting: Family Medicine

## 2022-03-19 ENCOUNTER — Encounter (HOSPITAL_COMMUNITY): Payer: Self-pay | Admitting: Hematology

## 2022-03-19 DIAGNOSIS — G40109 Localization-related (focal) (partial) symptomatic epilepsy and epileptic syndromes with simple partial seizures, not intractable, without status epilepticus: Secondary | ICD-10-CM

## 2022-03-28 ENCOUNTER — Other Ambulatory Visit: Payer: Self-pay | Admitting: Diagnostic Neuroimaging

## 2022-03-28 DIAGNOSIS — G40109 Localization-related (focal) (partial) symptomatic epilepsy and epileptic syndromes with simple partial seizures, not intractable, without status epilepticus: Secondary | ICD-10-CM

## 2022-04-10 ENCOUNTER — Other Ambulatory Visit: Payer: Self-pay | Admitting: Diagnostic Neuroimaging

## 2022-04-10 DIAGNOSIS — G40109 Localization-related (focal) (partial) symptomatic epilepsy and epileptic syndromes with simple partial seizures, not intractable, without status epilepticus: Secondary | ICD-10-CM

## 2022-04-10 MED ORDER — LACOSAMIDE 150 MG PO TABS: 150.0000 mg | ORAL_TABLET | Freq: Two times a day (BID) | ORAL | 0 refills | Status: DC

## 2022-04-10 NOTE — Telephone Encounter (Signed)
Pt request refill for Lacosamide 150 MG TABS at [facility] ([facility] )  Pt asking for 4-day supply until medication is delivered from [facility]. Please send to [facility]

## 2022-04-10 NOTE — Telephone Encounter (Signed)
Pt would like 4-day supply for Lacosamide 150 MG TABS sent to:  [facility], [place], [place] [id number] [phone]

## 2022-04-17 DIAGNOSIS — H903 Sensorineural hearing loss, bilateral: Secondary | ICD-10-CM | POA: Diagnosis not present

## 2022-05-31 ENCOUNTER — Other Ambulatory Visit: Payer: Self-pay | Admitting: *Deleted

## 2022-05-31 DIAGNOSIS — G40109 Localization-related (focal) (partial) symptomatic epilepsy and epileptic syndromes with simple partial seizures, not intractable, without status epilepticus: Secondary | ICD-10-CM

## 2022-06-04 MED ORDER — LEVETIRACETAM ER 500 MG PO TB24: 1500.0000 mg | ORAL_TABLET | Freq: Two times a day (BID) | ORAL | 4 refills | Status: DC

## 2022-06-04 MED ORDER — LAMOTRIGINE 25 MG PO TABS: 50.0000 mg | ORAL_TABLET | Freq: Two times a day (BID) | ORAL | 3 refills | Status: DC

## 2022-06-04 MED ORDER — LACOSAMIDE 150 MG PO TABS: 150.0000 mg | ORAL_TABLET | Freq: Two times a day (BID) | ORAL | 3 refills | Status: DC

## 2022-06-05 ENCOUNTER — Other Ambulatory Visit: Payer: Self-pay | Admitting: *Deleted

## 2022-06-05 DIAGNOSIS — C50212 Malignant neoplasm of upper-inner quadrant of left female breast: Secondary | ICD-10-CM

## 2022-06-05 MED ORDER — ANASTROZOLE 1 MG PO TABS: ORAL_TABLET | ORAL | 3 refills | Status: DC

## 2022-06-05 NOTE — Progress Notes (Signed)
Rx printed on 06/04/22, signed and faxed to [facility] on 06/05/22, received confirmation.

## 2022-06-27 DIAGNOSIS — Z170 Estrogen receptor positive status [ER+]: Secondary | ICD-10-CM | POA: Diagnosis not present

## 2022-06-27 DIAGNOSIS — C50412 Malignant neoplasm of upper-outer quadrant of left female breast: Secondary | ICD-10-CM | POA: Diagnosis not present

## 2022-06-28 ENCOUNTER — Encounter (HOSPITAL_COMMUNITY): Payer: Self-pay | Admitting: Hematology

## 2022-06-28 ENCOUNTER — Telehealth: Payer: Self-pay | Admitting: *Deleted

## 2022-06-28 NOTE — Telephone Encounter (Signed)
Patient called to advise that she is moving to [place] and would like a referral placed to [facility] for her continued oncology care.  Referral will be made.

## 2022-07-02 ENCOUNTER — Inpatient Hospital Stay: Payer: Medicare Other
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
## 2022-07-05 ENCOUNTER — Other Ambulatory Visit: Payer: Self-pay | Admitting: *Deleted

## 2022-07-05 ENCOUNTER — Encounter: Payer: Self-pay | Admitting: Family Medicine

## 2022-07-05 ENCOUNTER — Ambulatory Visit: Payer: Medicare Other | Admitting: Family Medicine

## 2022-07-05 VITALS — BP 142/63 | HR 50 | Ht 67.0 in | Wt 198.0 lb

## 2022-07-05 DIAGNOSIS — G40109 Localization-related (focal) (partial) symptomatic epilepsy and epileptic syndromes with simple partial seizures, not intractable, without status epilepticus: Secondary | ICD-10-CM | POA: Diagnosis not present

## 2022-07-05 MED ORDER — LACOSAMIDE 150 MG PO TABS: 150.0000 mg | ORAL_TABLET | Freq: Two times a day (BID) | ORAL | 1 refills | Status: DC

## 2022-07-05 MED ORDER — LEVETIRACETAM ER 500 MG PO TB24: 1500.0000 mg | ORAL_TABLET | Freq: Two times a day (BID) | ORAL | 3 refills | Status: DC

## 2022-07-05 MED ORDER — LAMOTRIGINE 25 MG PO TABS: 50.0000 mg | ORAL_TABLET | Freq: Two times a day (BID) | ORAL | 3 refills | Status: AC

## 2022-07-09 ENCOUNTER — Encounter: Payer: Self-pay | Admitting: Family Medicine

## 2022-07-09 ENCOUNTER — Ambulatory Visit: Payer: Medicare Other | Admitting: Family Medicine

## 2022-07-09 ENCOUNTER — Ambulatory Visit: Payer: Medicare Other | Admitting: Hematology

## 2022-07-09 VITALS — BP 113/54 | HR 58 | Ht 67.0 in | Wt 204.0 lb

## 2022-07-09 DIAGNOSIS — R5383 Other fatigue: Secondary | ICD-10-CM | POA: Diagnosis not present

## 2022-07-09 DIAGNOSIS — E559 Vitamin D deficiency, unspecified: Secondary | ICD-10-CM | POA: Diagnosis not present

## 2022-07-09 DIAGNOSIS — R7303 Prediabetes: Secondary | ICD-10-CM

## 2022-07-09 DIAGNOSIS — E782 Mixed hyperlipidemia: Secondary | ICD-10-CM

## 2022-07-09 DIAGNOSIS — M810 Age-related osteoporosis without current pathological fracture: Secondary | ICD-10-CM | POA: Diagnosis not present

## 2022-07-09 DIAGNOSIS — Z23 Encounter for immunization: Secondary | ICD-10-CM | POA: Diagnosis not present

## 2022-07-09 NOTE — Patient Instructions (Signed)
Please go to pharmacy for Shingles vaccine (2 dose series given 6 months apart)

## 2022-07-09 NOTE — Progress Notes (Signed)
New Patient Office Visit  Subjective    Patient ID: Darlene Moore, female    DOB: 01-Dec-1945  Age: 76 y.o. MRN: [id number]  CC:  Chief Complaint  Patient presents with   Establish Care    HPI DEVAN BABINO presents to establish care. She has a pmh of temporal lobe epilepsy and follows with neurology for this. PMH of breast cancer (in remission) and is followed by [facility].  She admits to some fatigue. She has moved into an assisted living facility in the last 3 months and says this has helped a lot with her sadness. She has been doing well with diet and exercise. She has a dog that she walks on the property and does 4 laps around the facility. For her diet she tries to adhere to a low sugar diet and eats Atkins and lean cuisine meals. She uses artificial sweetners in her coffee and sweet tea.   She does have a reported hx of osteoporosis and vit d deficiency. Has an hx of prediabetes but has not followed up with anyone about this. She is interested in a1c testing today. She was also prescribed atorvastatin '20mg'$  but has not been taking this. She is unsure why and can't remember if the medication makes her sleepy.   Outpatient Encounter Medications as of 07/09/2022  Medication Sig   Calcium Carbonate (CALCIUM 600 PO) Take by mouth 2 (two) times daily.   ibuprofen (ADVIL,MOTRIN) 200 MG tablet Take 400 mg by mouth every 8 (eight) hours as needed for mild pain or moderate pain.   Lacosamide 150 MG TABS Take 1 tablet (150 mg total) by mouth in the morning and at bedtime.   lamoTRIgine (LAMICTAL) 25 MG tablet Take 2 tablets (50 mg total) by mouth 2 (two) times daily.   levETIRAcetam (KEPPRA XR) 500 MG 24 hr tablet Take 3 tablets (1,500 mg total) by mouth in the morning and at bedtime.   Multiple Vitamin (MULTIVITAMIN) tablet Take 1 tablet by mouth daily.   VITAMIN D PO Take 1,000 Units by mouth.   Wheat Dextrin (BENEFIBER PO) Take by mouth.   [DISCONTINUED] anastrozole (ARIMIDEX) 1 MG tablet  TAKE 1 TABLET BY MOUTH DAILY (Patient not taking: Reported on 07/09/2022)   [DISCONTINUED] atorvastatin (LIPITOR) 20 MG tablet Take 20 mg by mouth daily. (Patient not taking: Reported on 07/09/2022)   [DISCONTINUED] sertraline (ZOLOFT) 25 MG tablet Take 25 mg by mouth daily. (Patient not taking: Reported on 07/09/2022)   No facility-administered encounter medications on file as of 07/09/2022.    Past Medical History:  Diagnosis Date   Concussion    Family history of breast cancer    Family history of kidney cancer    Family history of ovarian cancer    Family history of stomach cancer    Family history of uterine cancer    High cholesterol    Medical history non-contributory    Seizures ([place])    last seizure 03/18/20    Past Surgical History:  Procedure Laterality Date   CHOLECYSTECTOMY     COLONOSCOPY     COLONOSCOPY N/A 09/29/2015   Procedure: COLONOSCOPY;  Surgeon: Rogene Houston, MD;  [facility];  Service: Endoscopy;  Laterality: N/A;  730 - moved to 9:00 - Ann to notify pt   PARTIAL MASTECTOMY WITH NEEDLE LOCALIZATION AND AXILLARY SENTINEL LYMPH NODE BX Left 10/01/2018   Procedure: LEFT BREAST PARTIAL MASTECTOMY WITH SENTINEL LYMPH NODE BIOPSY AFTER NEEDLE LOCALIZATION AND RADIOTRACER PLACEMENT;  Surgeon: Virl Cagey, MD;  Location: [facility];  Service: General;  Laterality: Left;    Family History  Problem Relation Age of Onset   Diabetes Mother    Heart disease Father        d. 24   Breast cancer Sister 32       d. 98   Liver cancer Sister    Breast cancer Sister 47       double mastectomy   Kidney cancer Maternal Aunt    Kidney cancer Maternal Uncle    Stomach cancer Maternal Uncle    Brain cancer Paternal Aunt    Kidney cancer Paternal Aunt    Kidney cancer Paternal Aunt    Bladder Cancer Paternal Aunt    Skin cancer Paternal Aunt        on ankle; d. 99   Uterine cancer Maternal Grandmother    Leukemia Paternal Grandmother    Ovarian cancer  Niece 81   Uterine cancer Niece 67   Stomach cancer Niece 47   Seizures Other        maternal    Social History   Socioeconomic History   Marital status: Widowed    Spouse name: Not on file   Number of children: 1   Years of education: 11   Highest education level: Not on file  Occupational History   Not on file  Tobacco Use   Smoking status: Never   Smokeless tobacco: Never  Substance and Sexual Activity   Alcohol use: No   Drug use: No   Sexual activity: Not on file  Other Topics Concern   Not on file  Social History Narrative   07/2019  Lives home alone. Widow.   Retired/ not working.   Education 12th grade.  Children 1.  Drinks decaff coffee, tea.    Social Determinants of Health   Financial Resource Strain: Not on file  Food Insecurity: Not on file  Transportation Needs: Not on file  Physical Activity: Not on file  Stress: Not on file  Social Connections: Not on file  Intimate Partner Violence: Not on file    Review of Systems  Constitutional:  Negative for chills and fever.  Respiratory:  Negative for cough and shortness of breath.   Cardiovascular:  Negative for chest pain.  Neurological:  Negative for headaches.        Objective    BP (!) 113/54   Pulse (!) 58   Ht '5\' 7"'$  (1.702 m)   Wt 204 lb (92.5 kg)   SpO2 98%   BMI 31.95 kg/m   Physical Exam Vitals and nursing note reviewed.  Constitutional:      General: She is not in acute distress.    Appearance: Normal appearance.  HENT:     Head: Normocephalic and atraumatic.     Right Ear: External ear normal.     Left Ear: External ear normal.     Nose: Nose normal.  Eyes:     Conjunctiva/sclera: Conjunctivae normal.  Cardiovascular:     Rate and Rhythm: Normal rate and regular rhythm.  Pulmonary:     Effort: Pulmonary effort is normal.     Breath sounds: Normal breath sounds.  Neurological:     General: No focal deficit present.     Mental Status: She is alert and oriented to person,  place, and time.  Psychiatric:        Mood and Affect: Mood normal.  Behavior: Behavior normal.        Thought Content: Thought content normal.        Judgment: Judgment normal.         Assessment & Plan:   Problem List Items Addressed This Visit       Musculoskeletal and Integument   Osteoporosis   Relevant Orders   Vitamin D (25 hydroxy)   Flu Vaccine QUAD High Dose(Fluad) (Completed)   Pneumococcal conjugate vaccine 20-valent (Prevnar 20) (Completed)   Other Visit Diagnoses     Prediabetes    -  Primary   Relevant Orders   HgB A1c   Flu Vaccine QUAD High Dose(Fluad) (Completed)   Pneumococcal conjugate vaccine 20-valent (Prevnar 20) (Completed)   Vitamin D deficiency       Relevant Orders   Vitamin D (25 hydroxy)   Flu Vaccine QUAD High Dose(Fluad) (Completed)   Pneumococcal conjugate vaccine 20-valent (Prevnar 20) (Completed)   Mixed hyperlipidemia       Relevant Orders   Lipid panel   Flu Vaccine QUAD High Dose(Fluad) (Completed)   Pneumococcal conjugate vaccine 20-valent (Prevnar 20) (Completed)   Other fatigue       Relevant Orders   COMPLETE METABOLIC PANEL WITH GFR   CBC   Flu Vaccine QUAD High Dose(Fluad) (Completed)   Pneumococcal conjugate vaccine 20-valent (Prevnar 20) (Completed)      Need for immunization: Over 65yo flu vaccine and Pneumo 20 Return in about 6 months (around 01/08/2023).   Owens Loffler, DO

## 2022-07-10 LAB — LIPID PANEL
Cholesterol: 198 mg/dL (ref <200)
HDL: 64 mg/dL (ref >=50)
LDL Cholesterol (Calc): 115 mg/dL (calc) — ABNORMAL HIGH
Non-HDL Cholesterol (Calc): 134 mg/dL (calc) — ABNORMAL HIGH (ref <130)
Total CHOL/HDL Ratio: 3.1 (calc) (ref <5.0)
Triglycerides: 90 mg/dL (ref <150)

## 2022-07-10 LAB — HEMOGLOBIN A1C
Hgb A1c MFr Bld: 5.7 % of total Hgb — ABNORMAL HIGH (ref <5.7)
Mean Plasma Glucose: 117 mg/dL
eAG (mmol/L): 6.5 mmol/L

## 2022-07-10 LAB — COMPLETE METABOLIC PANEL WITH GFR
AG Ratio: 1.9 (calc) (ref 1.0–2.5)
ALT: 12 U/L (ref 6–29)
AST: 17 U/L (ref 10–35)
Albumin: 4.7 g/dL (ref 3.6–5.1)
Alkaline phosphatase (APISO): 75 U/L (ref 37–153)
BUN: 20 mg/dL (ref 7–25)
CO2: 30 mmol/L (ref 20–32)
Calcium: 10.3 mg/dL (ref 8.6–10.4)
Chloride: 102 mmol/L (ref 98–110)
Creat: 0.81 mg/dL (ref 0.60–1.00)
Globulin: 2.5 g/dL (calc) (ref 1.9–3.7)
Glucose, Bld: 91 mg/dL (ref 65–139)
Potassium: 5.3 mmol/L (ref 3.5–5.3)
Sodium: 139 mmol/L (ref 135–146)
Total Bilirubin: 0.3 mg/dL (ref 0.2–1.2)
Total Protein: 7.2 g/dL (ref 6.1–8.1)
eGFR: 76 mL/min/{1.73_m2} (ref >=60)

## 2022-07-10 LAB — CBC
HCT: 42.8 % (ref 35.0–45.0)
Hemoglobin: 14.4 g/dL (ref 11.7–15.5)
MCH: 31 pg (ref 27.0–33.0)
MCHC: 33.6 g/dL (ref 32.0–36.0)
MCV: 92.2 fL (ref 80.0–100.0)
MPV: 12.2 fL (ref 7.5–12.5)
Platelets: 158 10*3/uL (ref 140–400)
RBC: 4.64 10*6/uL (ref 3.80–5.10)
RDW: 11.8 % (ref 11.0–15.0)
WBC: 4.9 10*3/uL (ref 3.8–10.8)

## 2022-07-10 LAB — VITAMIN D 25 HYDROXY (VIT D DEFICIENCY, FRACTURES): Vit D, 25-Hydroxy: 56 ng/mL (ref 30–100)

## 2022-07-11 ENCOUNTER — Other Ambulatory Visit: Payer: Self-pay | Admitting: Family Medicine

## 2022-07-11 DIAGNOSIS — E782 Mixed hyperlipidemia: Secondary | ICD-10-CM

## 2022-07-11 MED ORDER — ATORVASTATIN CALCIUM 10 MG PO TABS: 10.0000 mg | ORAL_TABLET | Freq: Every day | ORAL | 3 refills | Status: DC

## 2022-07-13 ENCOUNTER — Inpatient Hospital Stay: Payer: Medicare Other | Attending: Hematology & Oncology

## 2022-07-13 ENCOUNTER — Encounter: Payer: Self-pay | Admitting: Hematology & Oncology

## 2022-07-13 ENCOUNTER — Inpatient Hospital Stay: Payer: Medicare Other | Admitting: Hematology & Oncology

## 2022-07-13 VITALS — BP 108/53 | HR 61 | Temp 97.8°F | Resp 20 | Ht 66.0 in | Wt 199.8 lb

## 2022-07-13 DIAGNOSIS — Z806 Family history of leukemia: Secondary | ICD-10-CM | POA: Insufficient documentation

## 2022-07-13 DIAGNOSIS — Z8051 Family history of malignant neoplasm of kidney: Secondary | ICD-10-CM | POA: Diagnosis not present

## 2022-07-13 DIAGNOSIS — Z853 Personal history of malignant neoplasm of breast: Secondary | ICD-10-CM | POA: Diagnosis not present

## 2022-07-13 DIAGNOSIS — Z803 Family history of malignant neoplasm of breast: Secondary | ICD-10-CM

## 2022-07-13 DIAGNOSIS — Z780 Asymptomatic menopausal state: Secondary | ICD-10-CM

## 2022-07-13 DIAGNOSIS — Z800 Family history of malignant neoplasm of digestive organs: Secondary | ICD-10-CM | POA: Diagnosis not present

## 2022-07-13 DIAGNOSIS — Z8041 Family history of malignant neoplasm of ovary: Secondary | ICD-10-CM

## 2022-07-13 DIAGNOSIS — M8580 Other specified disorders of bone density and structure, unspecified site: Secondary | ICD-10-CM | POA: Diagnosis not present

## 2022-07-13 DIAGNOSIS — Z8049 Family history of malignant neoplasm of other genital organs: Secondary | ICD-10-CM | POA: Diagnosis not present

## 2022-07-13 DIAGNOSIS — C50212 Malignant neoplasm of upper-inner quadrant of left female breast: Secondary | ICD-10-CM

## 2022-07-13 LAB — CBC WITH DIFFERENTIAL (CANCER CENTER ONLY)
Abs Immature Granulocytes: 0.02 10*3/uL (ref 0.00–0.07)
Basophils Absolute: 0.1 10*3/uL (ref 0.0–0.1)
Basophils Relative: 1 %
Eosinophils Absolute: 0 10*3/uL (ref 0.0–0.5)
Eosinophils Relative: 0 %
HCT: 44.6 % (ref 36.0–46.0)
Hemoglobin: 14.6 g/dL (ref 12.0–15.0)
Immature Granulocytes: 0 %
Lymphocytes Relative: 15 %
Lymphs Abs: 1.1 10*3/uL (ref 0.7–4.0)
MCH: 30.6 pg (ref 26.0–34.0)
MCHC: 32.7 g/dL (ref 30.0–36.0)
MCV: 93.5 fL (ref 80.0–100.0)
Monocytes Absolute: 0.4 10*3/uL (ref 0.1–1.0)
Monocytes Relative: 6 %
Neutro Abs: 5.4 10*3/uL (ref 1.7–7.7)
Neutrophils Relative %: 78 %
Platelet Count: 169 10*3/uL (ref 150–400)
RBC: 4.77 MIL/uL (ref 3.87–5.11)
RDW: 12 % (ref 11.5–15.5)
WBC Count: 7 10*3/uL (ref 4.0–10.5)
nRBC: 0 % (ref 0.0–0.2)

## 2022-07-13 LAB — CMP (CANCER CENTER ONLY)
ALT: 14 U/L (ref 0–44)
AST: 20 U/L (ref 15–41)
Albumin: 4.7 g/dL (ref 3.5–5.0)
Alkaline Phosphatase: 78 U/L (ref 38–126)
Anion gap: 8 (ref 5–15)
BUN: 16 mg/dL (ref 8–23)
CO2: 30 mmol/L (ref 22–32)
Calcium: 10.1 mg/dL (ref 8.9–10.3)
Chloride: 103 mmol/L (ref 98–111)
Creatinine: 0.97 mg/dL (ref 0.44–1.00)
GFR, Estimated: >60 mL/min (ref >60)
Glucose, Bld: 138 mg/dL — ABNORMAL HIGH (ref 70–99)
Potassium: 4.7 mmol/L (ref 3.5–5.1)
Sodium: 141 mmol/L (ref 135–145)
Total Bilirubin: 0.4 mg/dL (ref 0.3–1.2)
Total Protein: 7.5 g/dL (ref 6.5–8.1)

## 2022-07-13 LAB — LACTATE DEHYDROGENASE: LDH: 191 U/L (ref 98–192)

## 2022-07-13 NOTE — Progress Notes (Signed)
Referral MD  Reason for Referral: Stage 1a (T1cN0M0) invasive ductal carcinoma of the LEFT breast -- ER+/PR+/HER2-  Chief Complaint  Patient presents with   New Patient (Initial Visit)    Transferred care from [place]. Breast cancer Right breast.  : I just moved here from [place].  HPI: Darlene Moore is a very charming 76 year old postmenopausal white female.  She has a history of seizures.  She had moved from [place] down to [place] to be closer to her family and because of seizures.  She has had seizures for many years.  She was followed up in [place] by Dr. Delton Coombes.  As always, he was very thorough.  Patient was diagnosed with a stage I ductal carcinoma of the left breast back in 2019.  She subsequently underwent a left breast lumpectomy and sentinel node biopsy in January 2020.  The pathology showed a 1.6 cm ductal carcinoma that was grade 1.  All margins were negative even though there was a positive margin that was deep.  She underwent radiation therapy.  She tolerated this well.  She now is on anastrozole.  She is doing well with this.  She really has had no complaints.  Again, she has had seizures.  There is been no problems with nausea or vomiting.  She has had no problems with bowels or bladder.  She wants to do the Cologuard for the colon cancer screening.  She has had no issues with fever.  There is been no problems with COVID.  She does have osteoporosis.  I think she is on some vitamin D and calcium for this.  Overall, I would say performance status is probably ECOG 1.    Past Medical History:  Diagnosis Date   Concussion    Family history of breast cancer    Family history of kidney cancer    Family history of ovarian cancer    Family history of stomach cancer    Family history of uterine cancer    High cholesterol    Medical history non-contributory    Seizures ([place])    last seizure 03/18/20  :   Past Surgical History:  Procedure  Laterality Date   CHOLECYSTECTOMY     COLONOSCOPY     COLONOSCOPY N/A 09/29/2015   Procedure: COLONOSCOPY;  Surgeon: Rogene Houston, MD;  Location: [facility];  Service: Endoscopy;  Laterality: N/A;  730 - moved to 9:00 - Ann to notify pt   PARTIAL MASTECTOMY WITH NEEDLE LOCALIZATION AND AXILLARY SENTINEL LYMPH NODE BX Left 10/01/2018   Procedure: LEFT BREAST PARTIAL MASTECTOMY WITH SENTINEL LYMPH NODE BIOPSY AFTER NEEDLE LOCALIZATION AND RADIOTRACER PLACEMENT;  Surgeon: Virl Cagey, MD;  Location: [facility]S;  Service: General;  Laterality: Left;  :   Current Outpatient Medications:    anastrozole (ARIMIDEX) 1 MG tablet, Take 1 mg by mouth daily., Disp: , Rfl:    atorvastatin (LIPITOR) 10 MG tablet, Take 1 tablet (10 mg total) by mouth daily., Disp: 90 tablet, Rfl: 3   Calcium Carbonate (CALCIUM 600 PO), Take by mouth 2 (two) times daily., Disp: , Rfl:    Lacosamide 150 MG TABS, Take 1 tablet (150 mg total) by mouth in the morning and at bedtime., Disp: 180 tablet, Rfl: 1   lamoTRIgine (LAMICTAL) 25 MG tablet, Take 2 tablets (50 mg total) by mouth 2 (two) times daily., Disp: 360 tablet, Rfl: 3   levETIRAcetam (KEPPRA XR) 500 MG 24 hr tablet, Take 3 tablets (1,500 mg total) by mouth in  the morning and at bedtime., Disp: 540 tablet, Rfl: 3   Multiple Vitamin (MULTIVITAMIN) tablet, Take 1 tablet by mouth daily., Disp: , Rfl:    sertraline (ZOLOFT) 25 MG tablet, Take 1 tablet by mouth daily., Disp: , Rfl:    VITAMIN D PO, Take 5,000 Units by mouth., Disp: , Rfl:    Wheat Dextrin (BENEFIBER PO), Take by mouth., Disp: , Rfl: :  :  No Known Allergies:   Family History  Problem Relation Age of Onset   Diabetes Mother    Heart disease Father        d. 65   Breast cancer Sister 18       d. 30   Liver cancer Sister    Breast cancer Sister 61       double mastectomy   Kidney cancer Maternal Aunt    Kidney cancer Maternal Uncle    Stomach cancer Maternal Uncle    Brain cancer Paternal  Aunt    Kidney cancer Paternal Aunt    Kidney cancer Paternal Aunt    Bladder Cancer Paternal Aunt    Skin cancer Paternal Aunt        on ankle; d. 99   Uterine cancer Maternal Grandmother    Leukemia Paternal Grandmother    Ovarian cancer Niece 28   Uterine cancer Niece 52   Stomach cancer Niece 71   Seizures Other        maternal  :   Social History   Socioeconomic History   Marital status: Widowed    Spouse name: Not on file   Number of children: 1   Years of education: 46   Highest education level: Not on file  Occupational History   Not on file  Tobacco Use   Smoking status: Never   Smokeless tobacco: Never  Substance and Sexual Activity   Alcohol use: No   Drug use: No   Sexual activity: Not on file  Other Topics Concern   Not on file  Social History Narrative   07/2019  Lives home alone. Widow.   Retired/ not working.   Education 12th grade.  Children 1.  Drinks decaff coffee, tea.    Social Determinants of Health   Financial Resource Strain: Not on file  Food Insecurity: Not on file  Transportation Needs: Not on file  Physical Activity: Not on file  Stress: Not on file  Social Connections: Not on file  Intimate Partner Violence: Not on file  :  Review of Systems  Constitutional: Negative.   HENT: Negative.    Eyes: Negative.   Respiratory: Negative.    Cardiovascular: Negative.   Gastrointestinal: Negative.   Genitourinary: Negative.   Musculoskeletal: Negative.   Skin: Negative.   Neurological:  Positive for seizures.  Endo/Heme/Allergies: Negative.   Psychiatric/Behavioral: Negative.       Exam: Her vital signs show temperature of 97.8.  Pulse 61.  Blood pressure 108/53.  Weight is 199 pounds.  _0 @ Physical Exam Vitals reviewed.  Constitutional:      Comments: Breast exam shows right breast with no masses, edema or erythema.  There is no right axillary adenopathy.  Left breast shows well-healed lumpectomy at about the 9 o'clock  position.  She has little bit of scar at the left lumpectomy site.  There is no distinct mass in the left breast.  There is no left axillary adenopathy.  HENT:     Head: Normocephalic and atraumatic.  Eyes:     Pupils: Pupils  are equal, round, and reactive to light.  Cardiovascular:     Rate and Rhythm: Normal rate and regular rhythm.     Heart sounds: Normal heart sounds.  Pulmonary:     Effort: Pulmonary effort is normal.     Breath sounds: Normal breath sounds.  Abdominal:     General: Bowel sounds are normal.     Palpations: Abdomen is soft.  Musculoskeletal:        General: No tenderness or deformity. Normal range of motion.     Cervical back: Normal range of motion.  Lymphadenopathy:     Cervical: No cervical adenopathy.  Skin:    General: Skin is warm and dry.     Findings: No erythema or rash.  Neurological:     Mental Status: She is alert and oriented to person, place, and time.  Psychiatric:        Behavior: Behavior normal.        Thought Content: Thought content normal.        Judgment: Judgment normal.       Recent Labs    07/13/22 1405  WBC 7.0  HGB 14.6  HCT 44.6  PLT 169    Recent Labs    07/13/22 1405  NA 141  K 4.7  CL 103  CO2 30  GLUCOSE 138*  BUN 16  CREATININE 0.97  CALCIUM 10.1    Blood smear review: None  Pathology: See above    Assessment and Plan: Darlene Moore is a very charming 76 year old white female.  She has a history of a very good prognostic early stage breast cancer.  She now is 3 and half years out from surgery.  She did have radiation therapy.  I think the risk of recurrence is going to be less than 10%.  She does not have a mammogram.  I think we can probably do a screening mammogram on her at this point.  I think we can probably get her back in 6 months.  I think this would be very reasonable.  If she has any problems before 6 months, we can certainly get her back for follow-up.

## 2022-07-14 LAB — CANCER ANTIGEN 27.29: CA 27.29: 31.1 U/mL (ref 0.0–38.6)

## 2022-07-30 DIAGNOSIS — H31002 Unspecified chorioretinal scars, left eye: Secondary | ICD-10-CM | POA: Diagnosis not present

## 2022-07-30 DIAGNOSIS — H25813 Combined forms of age-related cataract, bilateral: Secondary | ICD-10-CM | POA: Diagnosis not present

## 2022-07-30 DIAGNOSIS — H35372 Puckering of macula, left eye: Secondary | ICD-10-CM | POA: Diagnosis not present

## 2022-08-02 DIAGNOSIS — H25813 Combined forms of age-related cataract, bilateral: Secondary | ICD-10-CM | POA: Diagnosis not present

## 2022-08-14 DIAGNOSIS — H5111 Convergence insufficiency: Secondary | ICD-10-CM | POA: Diagnosis not present

## 2022-08-14 DIAGNOSIS — E785 Hyperlipidemia, unspecified: Secondary | ICD-10-CM | POA: Diagnosis not present

## 2022-08-14 DIAGNOSIS — Z79899 Other long term (current) drug therapy: Secondary | ICD-10-CM | POA: Diagnosis not present

## 2022-08-14 DIAGNOSIS — H25813 Combined forms of age-related cataract, bilateral: Secondary | ICD-10-CM | POA: Diagnosis not present

## 2022-08-14 DIAGNOSIS — H43393 Other vitreous opacities, bilateral: Secondary | ICD-10-CM | POA: Diagnosis not present

## 2022-08-14 DIAGNOSIS — H25811 Combined forms of age-related cataract, right eye: Secondary | ICD-10-CM | POA: Diagnosis not present

## 2022-08-28 DIAGNOSIS — R569 Unspecified convulsions: Secondary | ICD-10-CM | POA: Diagnosis not present

## 2022-08-28 DIAGNOSIS — H31002 Unspecified chorioretinal scars, left eye: Secondary | ICD-10-CM | POA: Diagnosis not present

## 2022-08-28 DIAGNOSIS — Z79899 Other long term (current) drug therapy: Secondary | ICD-10-CM | POA: Diagnosis not present

## 2022-08-28 DIAGNOSIS — H25812 Combined forms of age-related cataract, left eye: Secondary | ICD-10-CM | POA: Diagnosis not present

## 2022-08-28 DIAGNOSIS — F32A Depression, unspecified: Secondary | ICD-10-CM | POA: Diagnosis not present

## 2022-08-28 DIAGNOSIS — H35372 Puckering of macula, left eye: Secondary | ICD-10-CM | POA: Diagnosis not present

## 2022-08-28 DIAGNOSIS — H25813 Combined forms of age-related cataract, bilateral: Secondary | ICD-10-CM | POA: Diagnosis not present

## 2022-08-28 DIAGNOSIS — H5111 Convergence insufficiency: Secondary | ICD-10-CM | POA: Diagnosis not present

## 2022-08-28 DIAGNOSIS — E785 Hyperlipidemia, unspecified: Secondary | ICD-10-CM | POA: Diagnosis not present

## 2022-09-26 ENCOUNTER — Encounter (HOSPITAL_COMMUNITY): Payer: Self-pay | Admitting: Hematology

## 2022-10-03 ENCOUNTER — Ambulatory Visit (INDEPENDENT_AMBULATORY_CARE_PROVIDER_SITE_OTHER): Payer: BLUE CROSS/BLUE SHIELD

## 2022-10-03 DIAGNOSIS — Z1231 Encounter for screening mammogram for malignant neoplasm of breast: Secondary | ICD-10-CM | POA: Diagnosis not present

## 2022-10-03 DIAGNOSIS — C50212 Malignant neoplasm of upper-inner quadrant of left female breast: Secondary | ICD-10-CM

## 2022-11-01 DIAGNOSIS — H524 Presbyopia: Secondary | ICD-10-CM | POA: Diagnosis not present

## 2022-11-13 DIAGNOSIS — K080 Exfoliation of teeth due to systemic causes: Secondary | ICD-10-CM | POA: Diagnosis not present

## 2022-12-09 ENCOUNTER — Encounter (HOSPITAL_COMMUNITY): Payer: Self-pay | Admitting: Hematology

## 2023-01-01 ENCOUNTER — Encounter (HOSPITAL_COMMUNITY): Payer: Self-pay | Admitting: Hematology

## 2023-01-08 ENCOUNTER — Encounter: Payer: Self-pay | Admitting: Family Medicine

## 2023-01-08 ENCOUNTER — Encounter: Payer: Self-pay | Admitting: Medical Oncology

## 2023-01-08 ENCOUNTER — Telehealth: Payer: Self-pay | Admitting: Family Medicine

## 2023-01-08 ENCOUNTER — Inpatient Hospital Stay: Payer: Medicare Other | Admitting: Medical Oncology

## 2023-01-08 ENCOUNTER — Other Ambulatory Visit: Payer: Self-pay

## 2023-01-08 ENCOUNTER — Inpatient Hospital Stay: Payer: Medicare Other | Attending: Hematology & Oncology

## 2023-01-08 ENCOUNTER — Ambulatory Visit (INDEPENDENT_AMBULATORY_CARE_PROVIDER_SITE_OTHER): Payer: Medicare Other | Admitting: Family Medicine

## 2023-01-08 ENCOUNTER — Telehealth: Payer: Self-pay | Admitting: *Deleted

## 2023-01-08 VITALS — BP 124/59 | HR 53 | Temp 97.6°F | Ht 66.0 in | Wt 203.0 lb

## 2023-01-08 VITALS — BP 133/49 | HR 59 | Temp 97.9°F | Resp 19 | Ht 66.0 in | Wt 202.0 lb

## 2023-01-08 DIAGNOSIS — C50212 Malignant neoplasm of upper-inner quadrant of left female breast: Secondary | ICD-10-CM | POA: Diagnosis not present

## 2023-01-08 DIAGNOSIS — Z170 Estrogen receptor positive status [ER+]: Secondary | ICD-10-CM

## 2023-01-08 DIAGNOSIS — E7800 Pure hypercholesterolemia, unspecified: Secondary | ICD-10-CM | POA: Diagnosis not present

## 2023-01-08 DIAGNOSIS — G609 Hereditary and idiopathic neuropathy, unspecified: Secondary | ICD-10-CM

## 2023-01-08 DIAGNOSIS — Z79811 Long term (current) use of aromatase inhibitors: Secondary | ICD-10-CM | POA: Insufficient documentation

## 2023-01-08 DIAGNOSIS — G40109 Localization-related (focal) (partial) symptomatic epilepsy and epileptic syndromes with simple partial seizures, not intractable, without status epilepticus: Secondary | ICD-10-CM

## 2023-01-08 DIAGNOSIS — R7303 Prediabetes: Secondary | ICD-10-CM

## 2023-01-08 DIAGNOSIS — M8000XA Age-related osteoporosis with current pathological fracture, unspecified site, initial encounter for fracture: Secondary | ICD-10-CM

## 2023-01-08 LAB — CBC WITH DIFFERENTIAL (CANCER CENTER ONLY)
Abs Immature Granulocytes: 0.01 10*3/uL (ref 0.00–0.07)
Basophils Absolute: 0.1 10*3/uL (ref 0.0–0.1)
Basophils Relative: 1 %
Eosinophils Absolute: 0.1 10*3/uL (ref 0.0–0.5)
Eosinophils Relative: 1 %
HCT: 43.4 % (ref 36.0–46.0)
Hemoglobin: 14.3 g/dL (ref 12.0–15.0)
Immature Granulocytes: 0 %
Lymphocytes Relative: 25 %
Lymphs Abs: 1.4 10*3/uL (ref 0.7–4.0)
MCH: 31.1 pg (ref 26.0–34.0)
MCHC: 32.9 g/dL (ref 30.0–36.0)
MCV: 94.3 fL (ref 80.0–100.0)
Monocytes Absolute: 0.5 10*3/uL (ref 0.1–1.0)
Monocytes Relative: 10 %
Neutro Abs: 3.4 10*3/uL (ref 1.7–7.7)
Neutrophils Relative %: 63 %
Platelet Count: 170 10*3/uL (ref 150–400)
RBC: 4.6 MIL/uL (ref 3.87–5.11)
RDW: 12.5 % (ref 11.5–15.5)
WBC Count: 5.4 10*3/uL (ref 4.0–10.5)
nRBC: 0 % (ref 0.0–0.2)

## 2023-01-08 LAB — CMP (CANCER CENTER ONLY)
ALT: 16 U/L (ref 0–44)
AST: 19 U/L (ref 15–41)
Albumin: 4.6 g/dL (ref 3.5–5.0)
Alkaline Phosphatase: 72 U/L (ref 38–126)
Anion gap: 8 (ref 5–15)
BUN: 18 mg/dL (ref 8–23)
CO2: 30 mmol/L (ref 22–32)
Calcium: 10.1 mg/dL (ref 8.9–10.3)
Chloride: 104 mmol/L (ref 98–111)
Creatinine: 0.85 mg/dL (ref 0.44–1.00)
GFR, Estimated: >60 mL/min (ref >60)
Glucose, Bld: 106 mg/dL — ABNORMAL HIGH (ref 70–99)
Potassium: 5.5 mmol/L — ABNORMAL HIGH (ref 3.5–5.1)
Sodium: 142 mmol/L (ref 135–145)
Total Bilirubin: 0.5 mg/dL (ref 0.3–1.2)
Total Protein: 7.7 g/dL (ref 6.5–8.1)

## 2023-01-08 MED ORDER — GABAPENTIN 300 MG PO CAPS
300.0000 mg | ORAL_CAPSULE | Freq: Two times a day (BID) | ORAL | 0 refills | Status: DC | PRN
Start: 2023-01-08 — End: 2023-04-08

## 2023-01-08 MED ORDER — LEVETIRACETAM ER 500 MG PO TB24
1500.0000 mg | ORAL_TABLET | Freq: Two times a day (BID) | ORAL | 2 refills | Status: DC
Start: 2023-01-08 — End: 2023-12-09

## 2023-01-08 NOTE — Telephone Encounter (Signed)
Pt is requesting a refill for levETIRAcetam (KEPPRA XR) 500 MG 24 hr tablet.  Pharmacy: [facility] [phone]

## 2023-01-08 NOTE — Telephone Encounter (Signed)
Per scheduling message Darlene Moore - Called patient, she said that she would have to check with her son to see when he could bring her back up here for labs.

## 2023-01-08 NOTE — Telephone Encounter (Signed)
E-scribed refill to [facility] order as requested.

## 2023-01-08 NOTE — Progress Notes (Signed)
Established patient visit   Patient: Darlene Moore   DOB: May 30, 1946   77 y.o. Female  MRN: [id number] Visit Date: 01/08/2023  Today's healthcare provider: Charlton Amor, DO   Chief Complaint  Patient presents with   Foot Pain    Bilateral; She also c/o burning cessation in her toes.     Hyperglycemia    SUBJECTIVE    Chief Complaint  Patient presents with   Foot Pain    Bilateral; She also c/o burning cessation in her toes.     Hyperglycemia   HPI HPI     Foot Pain    Additional comments: Bilateral; She also c/o burning cessation in her toes.        Last edited by Yolanda Manges, CMA on 01/08/2023 10:33 AM.       Pt presents for follow up. She is having bilateral foot pain near his arches. She notes some numbness and tingling in her toes that goes up to her ankle. She also notes foot swelling. She said they were worse this past week but she propped them up and they resolved. She notes pain when they were swelling.   She is doing well at the assisted living facility. Unfortunately her dog passed last month and she has been sad about this, but has been able to handle her grief. She is also having some difficulty with her niece, Selena Batten, who takes care of her mother in [place]. She went to visit and gets upset with the way Selena Batten treats her mom.   Prediabetes - A1c is 5.7  - diet has been a little worse since our last visit. Pt said at the assisted living they are always sharing candy and cake. Additionally there is a monthly birthday party and patient says she has noticed she is eating more sweets than she usually does.   Review of Systems  Constitutional:  Negative for activity change, fatigue and fever.  Respiratory:  Negative for cough and shortness of breath.   Cardiovascular:  Negative for chest pain.  Gastrointestinal:  Negative for abdominal pain.  Genitourinary:  Negative for difficulty urinating.       Current Meds  Medication Sig   anastrozole (ARIMIDEX)  1 MG tablet Take 1 mg by mouth daily.   atorvastatin (LIPITOR) 10 MG tablet Take 1 tablet (10 mg total) by mouth daily.   Calcium Carbonate (CALCIUM 600 PO) Take by mouth 2 (two) times daily.   gabapentin (NEURONTIN) 300 MG capsule Take 1 capsule (300 mg total) by mouth 2 (two) times daily as needed (nerve pain).   Lacosamide 150 MG TABS Take 1 tablet (150 mg total) by mouth in the morning and at bedtime.   lamoTRIgine (LAMICTAL) 25 MG tablet Take 2 tablets (50 mg total) by mouth 2 (two) times daily.   levETIRAcetam (KEPPRA XR) 500 MG 24 hr tablet Take 3 tablets (1,500 mg total) by mouth in the morning and at bedtime.   Multiple Vitamin (MULTIVITAMIN) tablet Take 1 tablet by mouth daily.   Probiotic, Lactobacillus, CAPS Take by mouth.   sertraline (ZOLOFT) 25 MG tablet Take 1 tablet by mouth daily.   VITAMIN D PO Take 5,000 Units by mouth.   Wheat Dextrin (BENEFIBER PO) Take by mouth.    OBJECTIVE    BP (!) 124/59   Pulse (!) 53   Temp 97.6 F (36.4 C) (Oral)   Ht 5\' 6"  (1.676 m)   Wt 203 lb (92.1 kg)  SpO2 99%   BMI 32.77 kg/m   Physical Exam Vitals and nursing note reviewed.  Constitutional:      General: She is not in acute distress.    Appearance: Normal appearance.  HENT:     Head: Normocephalic and atraumatic.     Right Ear: External ear normal.     Left Ear: External ear normal.     Nose: Nose normal.  Eyes:     Conjunctiva/sclera: Conjunctivae normal.  Cardiovascular:     Rate and Rhythm: Normal rate and regular rhythm.  Pulmonary:     Effort: Pulmonary effort is normal.     Breath sounds: Normal breath sounds.  Neurological:     General: No focal deficit present.     Mental Status: She is alert and oriented to person, place, and time.  Psychiatric:        Mood and Affect: Mood normal.        Behavior: Behavior normal.        Thought Content: Thought content normal.        Judgment: Judgment normal.        ASSESSMENT & PLAN    Problem List Items  Addressed This Visit       Nervous and Auditory   Idiopathic peripheral neuropathy    - physical exam was nonspecific. I wonder if she is experiencing peripheral neuropathy due to prediabetes  - have ordered gabapentin to see if this improves pain      Relevant Medications   gabapentin (NEURONTIN) 300 MG capsule     Other   Prediabetes - Primary    - will check A1c since diet has been including more sugary foods - will check kidney function with cmp - lipid panel        Relevant Orders   COMPLETE METABOLIC PANEL WITH GFR   Lipid panel   HgB A1c    Return in about 3 months (around 04/09/2023).      Meds ordered this encounter  Medications   gabapentin (NEURONTIN) 300 MG capsule    Sig: Take 1 capsule (300 mg total) by mouth 2 (two) times daily as needed (nerve pain).    Dispense:  90 capsule    Refill:  0    Orders Placed This Encounter  Procedures   COMPLETE METABOLIC PANEL WITH GFR   Lipid panel   HgB A1c     Charlton Amor, DO  [facility] [phone] (phone) [phone] (fax)  [facility]

## 2023-01-08 NOTE — Assessment & Plan Note (Signed)
-   physical exam was nonspecific. I wonder if she is experiencing peripheral neuropathy due to prediabetes  - have ordered gabapentin to see if this improves pain

## 2023-01-08 NOTE — Assessment & Plan Note (Signed)
-   will check A1c since diet has been including more sugary foods - will check kidney function with cmp - lipid panel

## 2023-01-08 NOTE — Progress Notes (Unsigned)
Hematology and Oncology Follow Up Visit  Darlene Moore [id number] [birth year] 77 y.o. 01/09/2023  Past Medical History:  Diagnosis Date   Concussion    Family history of breast cancer    Family history of kidney cancer    Family history of ovarian cancer    Family history of stomach cancer    Family history of uterine cancer    High cholesterol    Medical history non-contributory    Seizures    last seizure 03/18/20    Principle Diagnosis:  Stage 1a (T1cN0M0) invasive ductal carcinoma of the LEFT breast -- ER+/PR+/HER2- 2019  Current Therapy:   Anastrozole Surveillance   PriorTherapy:   Lumpectomy XRT     Interim History:  Darlene Moore is back for follow-up for her 6 month follow up for her history of stage 1a breast cancer.   She reports that she is doing well. She does ask if it is possible to complete a PET scan to ensure that "there isn't any cancer" anywhere. She denies any signs or symptoms but does feel uneasy with just having the mammograms. Her last mammogram was in January 2024 which was non-concerning. She denies any breast changes, night sweats, unintentional weight loss.   She does have osteoporosis which is managed by her PCP. Last DEXA was on 06/07/2021.      Wt Readings from Last 3 Encounters:  01/08/23 202 lb (91.6 kg)  01/08/23 203 lb (92.1 kg)  07/13/22 199 lb 12.8 oz (90.6 kg)     Medications:   Current Outpatient Medications:    anastrozole (ARIMIDEX) 1 MG tablet, Take 1 mg by mouth daily., Disp: , Rfl:    atorvastatin (LIPITOR) 10 MG tablet, Take 1 tablet (10 mg total) by mouth daily., Disp: 90 tablet, Rfl: 3   Calcium Carbonate (CALCIUM 600 PO), Take by mouth 2 (two) times daily., Disp: , Rfl:    gabapentin (NEURONTIN) 300 MG capsule, Take 1 capsule (300 mg total) by mouth 2 (two) times daily as needed (nerve pain)., Disp: 90 capsule, Rfl: 0   Lacosamide 150 MG TABS, Take 1 tablet (150 mg total) by mouth in the morning and at bedtime., Disp:  180 tablet, Rfl: 1   lamoTRIgine (LAMICTAL) 25 MG tablet, Take 2 tablets (50 mg total) by mouth 2 (two) times daily., Disp: 360 tablet, Rfl: 3   Multiple Vitamin (MULTIVITAMIN) tablet, Take 1 tablet by mouth daily., Disp: , Rfl:    Probiotic, Lactobacillus, CAPS, Take by mouth., Disp: , Rfl:    sertraline (ZOLOFT) 25 MG tablet, Take 1 tablet by mouth daily., Disp: , Rfl:    VITAMIN D PO, Take 5,000 Units by mouth., Disp: , Rfl:    Wheat Dextrin (BENEFIBER PO), Take by mouth., Disp: , Rfl:    levETIRAcetam (KEPPRA XR) 500 MG 24 hr tablet, Take 3 tablets (1,500 mg total) by mouth in the morning and at bedtime., Disp: 540 tablet, Rfl: 2  Allergies: No Known Allergies  Past Medical History, Surgical history, Social history, and Family History were reviewed and updated.  Review of Systems: Review of Systems  Constitutional:  Negative for unexpected weight change.  Respiratory:  Negative for shortness of breath.   Cardiovascular:  Negative for chest pain and leg swelling.  Gastrointestinal:  Negative for abdominal pain and blood in stool.  Genitourinary:  Negative for hematuria.   Skin:  Negative for rash.  Neurological:  Negative for headaches.     Physical Exam:  height is 5\' 6"  (1.676  m) and weight is 202 lb (91.6 kg). Her oral temperature is 97.9 F (36.6 C). Her blood pressure is 133/49 (abnormal) and her pulse is 59 (abnormal). Her respiration is 19 and oxygen saturation is 100%.   Physical Exam General: NAD Cardiovascular: regular rate and rhythm Pulmonary: clear ant fields Breast: Breast exam shows right breast with no masses, edema or erythema.  There is no right axillary adenopathy.  Left breast shows well-healed lumpectomy at about the 9 o'clock position.  There is no distinct mass in the left breast.  There is no left axillary adenopathy.  Extremities: no edema, no joint deformities Skin: no rashes Neurological: Weakness but otherwise nonfocal   Lab Results  Component  Value Date   WBC 5.4 01/08/2023   HGB 14.3 01/08/2023   HCT 43.4 01/08/2023   MCV 94.3 01/08/2023   PLT 170 01/08/2023     Chemistry      Component Value Date/Time   NA 142 01/08/2023 1139   K 5.5 (H) 01/08/2023 1139   CL 104 01/08/2023 1139   CO2 30 01/08/2023 1139   BUN 18 01/08/2023 1139   CREATININE 0.85 01/08/2023 1139   CREATININE 0.80 01/08/2023 1034      Component Value Date/Time   CALCIUM 10.1 01/08/2023 1139   ALKPHOS 72 01/08/2023 1139   AST 19 01/08/2023 1139   AST 20 01/08/2023 1034   ALT 16 01/08/2023 1139   ALT 15 01/08/2023 1034   BILITOT 0.5 01/08/2023 1139   BILITOT 0.5 01/08/2023 1034      Assessment and Plan- Patient is a 77 y.o. female    Encounter Diagnoses  Name Primary?   Age-related osteoporosis with current pathological fracture, initial encounter    Malignant neoplasm of upper-inner quadrant of left breast in female, estrogen receptor positive Yes   She appears to be doing well. She will need to continue to see her specialist and PCP for her chronic health conditions and osteoporosis.   No evidence or recurrence on exam, in history or on most recent mammogram. She will continue taking her anastrozole medication. We will plan to see her again in 6 months or sooner as needed.   Disposition:  RTC 6 months MD, labs    Clent Jacks PA-C 4/17/202410:44 AM

## 2023-01-09 ENCOUNTER — Other Ambulatory Visit: Payer: Medicare Other

## 2023-01-09 ENCOUNTER — Encounter (HOSPITAL_COMMUNITY): Payer: Self-pay | Admitting: Hematology

## 2023-01-09 LAB — COMPLETE METABOLIC PANEL WITH GFR
AG Ratio: 2 (calc) (ref 1.0–2.5)
ALT: 15 U/L (ref 6–29)
AST: 20 U/L (ref 10–35)
Albumin: 4.8 g/dL (ref 3.6–5.1)
Alkaline phosphatase (APISO): 78 U/L (ref 37–153)
BUN: 19 mg/dL (ref 7–25)
CO2: 25 mmol/L (ref 20–32)
Calcium: 9.9 mg/dL (ref 8.6–10.4)
Chloride: 103 mmol/L (ref 98–110)
Creat: 0.8 mg/dL (ref 0.60–1.00)
Globulin: 2.4 g/dL (calc) (ref 1.9–3.7)
Glucose, Bld: 102 mg/dL — ABNORMAL HIGH (ref 65–99)
Potassium: 5 mmol/L (ref 3.5–5.3)
Sodium: 140 mmol/L (ref 135–146)
Total Bilirubin: 0.5 mg/dL (ref 0.2–1.2)
Total Protein: 7.2 g/dL (ref 6.1–8.1)
eGFR: 77 mL/min/{1.73_m2} (ref >=60)

## 2023-01-09 LAB — LIPID PANEL
Cholesterol: 165 mg/dL (ref <200)
HDL: 72 mg/dL (ref >=50)
LDL Cholesterol (Calc): 78 mg/dL (calc)
Non-HDL Cholesterol (Calc): 93 mg/dL (calc) (ref <130)
Total CHOL/HDL Ratio: 2.3 (calc) (ref <5.0)
Triglycerides: 66 mg/dL (ref <150)

## 2023-01-09 LAB — HEMOGLOBIN A1C
Hgb A1c MFr Bld: 5.6 % of total Hgb (ref <5.7)
Mean Plasma Glucose: 114 mg/dL
eAG (mmol/L): 6.3 mmol/L

## 2023-01-10 ENCOUNTER — Inpatient Hospital Stay: Payer: Medicare Other

## 2023-01-10 ENCOUNTER — Telehealth: Payer: Self-pay

## 2023-01-10 ENCOUNTER — Other Ambulatory Visit: Payer: Self-pay

## 2023-01-10 DIAGNOSIS — C50212 Malignant neoplasm of upper-inner quadrant of left female breast: Secondary | ICD-10-CM

## 2023-01-10 DIAGNOSIS — M8000XA Age-related osteoporosis with current pathological fracture, unspecified site, initial encounter for fracture: Secondary | ICD-10-CM

## 2023-01-10 DIAGNOSIS — Z79811 Long term (current) use of aromatase inhibitors: Secondary | ICD-10-CM | POA: Diagnosis not present

## 2023-01-10 DIAGNOSIS — Z170 Estrogen receptor positive status [ER+]: Secondary | ICD-10-CM | POA: Diagnosis not present

## 2023-01-10 LAB — BASIC METABOLIC PANEL - CANCER CENTER ONLY
Anion gap: 8 (ref 5–15)
BUN: 28 mg/dL — ABNORMAL HIGH (ref 8–23)
CO2: 29 mmol/L (ref 22–32)
Calcium: 9.7 mg/dL (ref 8.9–10.3)
Chloride: 103 mmol/L (ref 98–111)
Creatinine: 0.89 mg/dL (ref 0.44–1.00)
GFR, Estimated: >60 mL/min (ref >60)
Glucose, Bld: 95 mg/dL (ref 70–99)
Potassium: 4.7 mmol/L (ref 3.5–5.1)
Sodium: 140 mmol/L (ref 135–145)

## 2023-01-10 NOTE — Telephone Encounter (Signed)
Advised via MyChart.

## 2023-01-10 NOTE — Telephone Encounter (Signed)
-----   Message from Josph Macho, MD sent at 01/10/2023 11:13 AM EDT ----- Call - the K+ is much better!!!  Cindee Lame

## 2023-01-11 ENCOUNTER — Other Ambulatory Visit: Payer: Self-pay

## 2023-01-11 MED ORDER — ANASTROZOLE 1 MG PO TABS: 1.0000 mg | ORAL_TABLET | Freq: Every day | ORAL | 3 refills | Status: DC

## 2023-01-17 ENCOUNTER — Ambulatory Visit: Payer: Medicare Other

## 2023-01-17 ENCOUNTER — Ambulatory Visit: Payer: Medicare Other | Admitting: Hematology

## 2023-02-14 DIAGNOSIS — K080 Exfoliation of teeth due to systemic causes: Secondary | ICD-10-CM | POA: Diagnosis not present

## 2023-02-21 ENCOUNTER — Other Ambulatory Visit: Payer: Self-pay | Admitting: Neurology

## 2023-02-21 DIAGNOSIS — G40109 Localization-related (focal) (partial) symptomatic epilepsy and epileptic syndromes with simple partial seizures, not intractable, without status epilepticus: Secondary | ICD-10-CM

## 2023-02-25 ENCOUNTER — Encounter: Payer: Self-pay | Admitting: Family Medicine

## 2023-02-25 ENCOUNTER — Telehealth: Payer: Self-pay | Admitting: Family Medicine

## 2023-02-25 ENCOUNTER — Other Ambulatory Visit: Payer: Self-pay | Admitting: Family Medicine

## 2023-02-25 DIAGNOSIS — E782 Mixed hyperlipidemia: Secondary | ICD-10-CM

## 2023-02-25 MED ORDER — ATORVASTATIN CALCIUM 10 MG PO TABS: 10.0000 mg | ORAL_TABLET | Freq: Every day | ORAL | 3 refills | Status: DC

## 2023-02-25 NOTE — Telephone Encounter (Signed)
Patient called requesting a refill of ; Atorvastatin 10mg   Pharmacy ;  [facility] [phone]

## 2023-02-26 NOTE — Telephone Encounter (Signed)
Note sent to patient in Mychart regarding prescription refill.

## 2023-03-05 DIAGNOSIS — K080 Exfoliation of teeth due to systemic causes: Secondary | ICD-10-CM | POA: Diagnosis not present

## 2023-03-11 ENCOUNTER — Other Ambulatory Visit: Payer: Self-pay | Admitting: Hematology & Oncology

## 2023-03-11 ENCOUNTER — Other Ambulatory Visit: Payer: Self-pay | Admitting: Diagnostic Neuroimaging

## 2023-03-11 DIAGNOSIS — G40109 Localization-related (focal) (partial) symptomatic epilepsy and epileptic syndromes with simple partial seizures, not intractable, without status epilepticus: Secondary | ICD-10-CM

## 2023-04-08 ENCOUNTER — Encounter: Payer: Self-pay | Admitting: Family Medicine

## 2023-04-08 ENCOUNTER — Ambulatory Visit (INDEPENDENT_AMBULATORY_CARE_PROVIDER_SITE_OTHER): Payer: Medicare Other | Admitting: Family Medicine

## 2023-04-08 VITALS — BP 117/54 | HR 53 | Resp 18 | Ht 66.0 in | Wt 208.5 lb

## 2023-04-08 DIAGNOSIS — G609 Hereditary and idiopathic neuropathy, unspecified: Secondary | ICD-10-CM | POA: Diagnosis not present

## 2023-04-08 DIAGNOSIS — F5101 Primary insomnia: Secondary | ICD-10-CM

## 2023-04-08 DIAGNOSIS — R7303 Prediabetes: Secondary | ICD-10-CM

## 2023-04-08 DIAGNOSIS — R569 Unspecified convulsions: Secondary | ICD-10-CM | POA: Diagnosis not present

## 2023-04-08 LAB — POCT GLYCOSYLATED HEMOGLOBIN (HGB A1C): Hemoglobin A1C: 5.9 % — AB (ref 4.0–5.6)

## 2023-04-08 MED ORDER — TRAZODONE HCL 50 MG PO TABS
25.0000 mg | ORAL_TABLET | Freq: Every evening | ORAL | 0 refills | Status: DC | PRN
Start: 2023-04-08 — End: 2023-07-09

## 2023-04-08 MED ORDER — TRAZODONE HCL 50 MG PO TABS: 25.0000 mg | ORAL_TABLET | Freq: Every evening | ORAL | 0 refills | Status: DC | PRN

## 2023-04-08 NOTE — Progress Notes (Signed)
Established patient visit   Patient: Darlene Moore   DOB: 11-19-1945   77 y.o. Female  MRN: [id number] Visit Date: 04/08/2023  Today's healthcare provider: Charlton Amor, DO   Chief Complaint  Patient presents with   Follow-up    Pt would like to have A1c checked, new neuro referral for neuropathy that has gotten worse    SUBJECTIVE    Chief Complaint  Patient presents with   Follow-up    Pt would like to have A1c checked, new neuro referral for neuropathy that has gotten worse   HPI  Pt presents for prediabetes follow up. She is having signs of neuropathy. She was given gabapentin at last visit. She does not note improvement.   She is currently on anastrozole for her stage 1a breast cancer.   Review of Systems  Constitutional:  Negative for activity change, fatigue and fever.  Respiratory:  Negative for cough and shortness of breath.   Cardiovascular:  Negative for chest pain.  Gastrointestinal:  Negative for abdominal pain.  Genitourinary:  Negative for difficulty urinating.       Current Meds  Medication Sig   [DISCONTINUED] traZODone (DESYREL) 50 MG tablet Take 0.5 tablets (25 mg total) by mouth at bedtime as needed for sleep.    OBJECTIVE    BP (!) 117/54 (BP Location: Right Arm, Patient Position: Sitting, Cuff Size: Normal)   Pulse (!) 53   Resp 18   Ht 5\' 6"  (1.676 m)   Wt 208 lb 8 oz (94.6 kg)   SpO2 100%   BMI 33.65 kg/m   Physical Exam Vitals and nursing note reviewed.  Constitutional:      General: She is not in acute distress.    Appearance: Normal appearance.  HENT:     Head: Normocephalic and atraumatic.     Right Ear: External ear normal.     Left Ear: External ear normal.     Nose: Nose normal.  Eyes:     Conjunctiva/sclera: Conjunctivae normal.  Cardiovascular:     Rate and Rhythm: Normal rate and regular rhythm.     Comments: Decreased peripheral pulses b/l Pulmonary:     Effort: Pulmonary effort is normal.     Breath  sounds: Normal breath sounds.  Neurological:     General: No focal deficit present.     Mental Status: She is alert and oriented to person, place, and time.  Psychiatric:        Mood and Affect: Mood normal.        Behavior: Behavior normal.        Thought Content: Thought content normal.        Judgment: Judgment normal.       ASSESSMENT & PLAN    Problem List Items Addressed This Visit       Nervous and Auditory   Idiopathic peripheral neuropathy    - will go ahead and get tsh and b12 levels today to see if there is a metabolic etiology - will get abi workup to check for blood flow       Relevant Medications   traZODone (DESYREL) 50 MG tablet   Other Relevant Orders   Vitamin B12   TSH + free T4   US ARTERIAL ABI (SCREENING LOWER EXTREMITY)     Other   Prediabetes - Primary    - poc A1c at 5.9  - increased from three months ago, counseled on less sugar and healthier diet  -  we will continue to monitor       Relevant Orders   POCT HgB A1C (Completed)   Seizures (HCC)    - pt has hx of seizures and would like to go to a neurologist closer to [facility]      Relevant Orders   Ambulatory referral to Neurology   Primary insomnia    - pt has issue of staying asleep. Will wake up after two hours of being asleep and have trouble falling asleep - will try trazodone 25mg   - follow up in one month can be video visit      Relevant Medications   traZODone (DESYREL) 50 MG tablet    Return in about 4 weeks (around 05/06/2023) for video visit follow up insomnia.      Meds ordered this encounter  Medications   DISCONTD: traZODone (DESYREL) 50 MG tablet    Sig: Take 0.5 tablets (25 mg total) by mouth at bedtime as needed for sleep.    Dispense:  30 tablet    Refill:  0   traZODone (DESYREL) 50 MG tablet    Sig: Take 0.5 tablets (25 mg total) by mouth at bedtime as needed for sleep.    Dispense:  30 tablet    Refill:  0    Orders Placed This Encounter   Procedures   US ARTERIAL ABI (SCREENING LOWER EXTREMITY)    Standing Status:   Future    Standing Expiration Date:   04/07/2024    Order Specific Question:   Reason for Exam (SYMPTOM  OR DIAGNOSIS REQUIRED)    Answer:   evaluate for leg claudication, decreased peripheral pulses    Order Specific Question:   Preferred imaging location?    Answer:   Internal   Vitamin B12   TSH + free T4   Ambulatory referral to Neurology    Referral Priority:   Routine    Referral Type:   Consultation    Referral Reason:   Specialty Services Required    Requested Specialty:   Neurology    Number of Visits Requested:   1   POCT HgB A1C     Charlton Amor, DO  [facility] at [facility] [phone] (phone) [phone] (fax)  [facility]

## 2023-04-08 NOTE — Assessment & Plan Note (Signed)
-   pt has issue of staying asleep. Will wake up after two hours of being asleep and have trouble falling asleep - will try trazodone 25mg   - follow up in one month can be video visit

## 2023-04-08 NOTE — Assessment & Plan Note (Signed)
-   will go ahead and get tsh and b12 levels today to see if there is a metabolic etiology - will get abi workup to check for blood flow

## 2023-04-08 NOTE — Assessment & Plan Note (Signed)
-   poc A1c at 5.9  - increased from three months ago, counseled on less sugar and healthier diet  - we will continue to monitor

## 2023-04-08 NOTE — Assessment & Plan Note (Signed)
-   pt has hx of seizures and would like to go to a neurologist closer to [facility]

## 2023-04-09 ENCOUNTER — Other Ambulatory Visit: Payer: Self-pay

## 2023-04-09 DIAGNOSIS — G40109 Localization-related (focal) (partial) symptomatic epilepsy and epileptic syndromes with simple partial seizures, not intractable, without status epilepticus: Secondary | ICD-10-CM

## 2023-04-09 LAB — VITAMIN B12: Vitamin B-12: 529 pg/mL (ref 200–1100)

## 2023-04-09 LAB — TSH+FREE T4: TSH W/REFLEX TO FT4: 2.1 mIU/L (ref 0.40–4.50)

## 2023-04-09 MED ORDER — LACOSAMIDE 150 MG PO TABS
150.0000 mg | ORAL_TABLET | Freq: Two times a day (BID) | ORAL | 1 refills | Status: DC
Start: 2023-04-09 — End: 2023-04-09

## 2023-04-09 MED ORDER — LACOSAMIDE 150 MG PO TABS
150.0000 mg | ORAL_TABLET | Freq: Two times a day (BID) | ORAL | 1 refills | Status: AC
Start: 2023-04-09 — End: ?

## 2023-04-09 NOTE — Telephone Encounter (Signed)
Pt Last Seen 07/05/2022 Upcoming Appointment 07/10/2023  Lacosamide 150mg  Last Filled 09/19/2022

## 2023-04-11 ENCOUNTER — Other Ambulatory Visit: Payer: Self-pay | Admitting: *Deleted

## 2023-04-11 DIAGNOSIS — M79604 Pain in right leg: Secondary | ICD-10-CM

## 2023-04-15 ENCOUNTER — Ambulatory Visit (HOSPITAL_COMMUNITY)
Admission: RE | Admit: 2023-04-15 | Discharge: 2023-04-15 | Disposition: A | Discharge status: Home / Self Care | Payer: Medicare Other | Source: Ambulatory Visit | Attending: Vascular Surgery | Admitting: Vascular Surgery

## 2023-04-15 DIAGNOSIS — M79604 Pain in right leg: Secondary | ICD-10-CM | POA: Insufficient documentation

## 2023-04-15 DIAGNOSIS — M79605 Pain in left leg: Secondary | ICD-10-CM | POA: Insufficient documentation

## 2023-04-15 LAB — VAS US ABI WITH/WO TBI
Left ABI: 1.12
Right ABI: 1.08

## 2023-04-16 ENCOUNTER — Encounter: Payer: Self-pay | Admitting: Vascular Surgery

## 2023-04-16 ENCOUNTER — Ambulatory Visit: Payer: Medicare Other | Admitting: Vascular Surgery

## 2023-04-16 DIAGNOSIS — G609 Hereditary and idiopathic neuropathy, unspecified: Secondary | ICD-10-CM | POA: Diagnosis not present

## 2023-04-16 NOTE — Progress Notes (Signed)
[facility] [place]  ASSESSMENT / PLAN: 77 y.o. female with peripheral neuropathy of unclear cause.  She has a normal clinical vascular exam.  She has normal noninvasive testing.  She can follow-up with me on an as-needed basis.  CHIEF COMPLAINT: Bilateral lower extremity neuropathic discomfort  HISTORY OF PRESENT ILLNESS: Darlene Moore is a 77 y.o. female referred to clinic for evaluation of possible vascular cause of neuropathy.  The patient reports onset of neuropathy several months ago.  She describes paresthesias and sensory changes in the bilateral feet.  The patient is an avid walker walking is much as 10,000 steps a day.  She does not describe any claudication discomfort with walking.  She has no rest pain symptoms.  She has no ulcers about her feet.  She does have reticular veins about her feet and varicosities.  She reports no lower extremity swelling.  Past Medical History:  Diagnosis Date   Concussion    Family history of breast cancer    Family history of kidney cancer    Family history of ovarian cancer    Family history of stomach cancer    Family history of uterine cancer    High cholesterol    Medical history non-contributory    Seizures (HCC)    last seizure 03/18/20    Past Surgical History:  Procedure Laterality Date   CHOLECYSTECTOMY     COLONOSCOPY     COLONOSCOPY N/A 09/29/2015   Procedure: COLONOSCOPY;  Surgeon: Malissa Hippo, MD;  Location: [facility];  Service: Endoscopy;  Laterality: N/A;  730 - moved to 9:00 - Ann to notify pt   PARTIAL MASTECTOMY WITH NEEDLE LOCALIZATION AND AXILLARY SENTINEL LYMPH NODE BX Left 10/01/2018   Procedure: LEFT BREAST PARTIAL MASTECTOMY WITH SENTINEL LYMPH NODE BIOPSY AFTER NEEDLE LOCALIZATION AND RADIOTRACER PLACEMENT;  Surgeon: Lucretia Roers, MD;  Location: [facility] ORS;  Service: General;  Laterality: Left;    Family History  Problem Relation Age of Onset   Diabetes Mother    Heart disease  Father        d. 21   Breast cancer Sister 20       d. 22   Liver cancer Sister    Breast cancer Sister 61       double mastectomy   Kidney cancer Maternal Aunt    Kidney cancer Maternal Uncle    Stomach cancer Maternal Uncle    Brain cancer Paternal Aunt    Kidney cancer Paternal Aunt    Kidney cancer Paternal Aunt    Bladder Cancer Paternal Aunt    Skin cancer Paternal Aunt        on ankle; d. 99   Uterine cancer Maternal Grandmother    Leukemia Paternal Grandmother    Ovarian cancer Niece 20   Uterine cancer Niece 89   Stomach cancer Niece 20   Seizures Other        maternal    Social History   Socioeconomic History   Marital status: Widowed    Spouse name: Not on file   Number of children: 1   Years of education: 90   Highest education level: 12th grade  Occupational History   Not on file  Tobacco Use   Smoking status: Never   Smokeless tobacco: Never  Vaping Use   Vaping status: Not on file  Substance and Sexual Activity   Alcohol use: No   Drug use: No   Sexual activity: Not on file  Other Topics Concern   Not on file  Social History Narrative   07/2019  Lives home alone. Widow.   Retired/ not working.   Education 12th grade.  Children 1.  Drinks decaff coffee, tea.    Social Determinants of Health   Financial Resource Strain: Low Risk  (04/07/2023)   Overall Financial Resource Strain (CARDIA)    Difficulty of Paying Living Expenses: Not hard at all  Food Insecurity: No Food Insecurity (04/07/2023)   Hunger Vital Sign    Worried About Running Out of Food in the Last Year: Never true    Ran Out of Food in the Last Year: Never true  Transportation Needs: No Transportation Needs (04/07/2023)   PRAPARE - Administrator, Civil Service (Medical): No    Lack of Transportation (Non-Medical): No  Physical Activity: Sufficiently Active (04/07/2023)   Exercise Vital Sign    Days of Exercise per Week: 4 days    Minutes of Exercise per Session: 40 min   Stress: No Stress Concern Present (04/07/2023)   [employer] of Occupational Health - Occupational Stress Questionnaire    Feeling of Stress : Only a little  Social Connections: Moderately Integrated (04/07/2023)   Social Connection and Isolation Panel [NHANES]    Frequency of Communication with Friends and Family: Twice a week    Frequency of Social Gatherings with Friends and Family: More than three times a week    Attends Religious Services: 1 to 4 times per year    Active Member of [employer] or Organizations: Yes    Attends Banker Meetings: 1 to 4 times per year    Marital Status: Widowed  Intimate Partner Violence: Unknown (07/31/2022)   [facility] from [facility]   HITS    Physically Hurt: Not on file    Insult or Talk Down To: Not on file    Threaten Physical Harm: Not on file    Scream or Curse: Not on file    No Known Allergies  Current Outpatient Medications  Medication Sig Dispense Refill   anastrozole (ARIMIDEX) 1 MG tablet TAKE 1 TABLET BY MOUTH DAILY 90 tablet 3   atorvastatin (LIPITOR) 10 MG tablet Take 1 tablet (10 mg total) by mouth daily. 90 tablet 3   Calcium Carbonate (CALCIUM 600 PO) Take by mouth 2 (two) times daily.     Lacosamide 150 MG TABS Take 1 tablet (150 mg total) by mouth in the morning and at bedtime. 180 tablet 1   lamoTRIgine (LAMICTAL) 25 MG tablet Take 2 tablets (50 mg total) by mouth 2 (two) times daily. 360 tablet 3   levETIRAcetam (KEPPRA XR) 500 MG 24 hr tablet Take 3 tablets (1,500 mg total) by mouth in the morning and at bedtime. 540 tablet 2   Multiple Vitamin (MULTIVITAMIN) tablet Take 1 tablet by mouth daily.     Probiotic, Lactobacillus, CAPS Take by mouth.     sertraline (ZOLOFT) 25 MG tablet Take 1 tablet by mouth daily.     traZODone (DESYREL) 50 MG tablet Take 0.5 tablets (25 mg total) by mouth at bedtime as needed for sleep. 30 tablet 0   VITAMIN D PO Take 5,000 Units by mouth.     Wheat Dextrin  (BENEFIBER PO) Take by mouth.     No current facility-administered medications for this visit.    PHYSICAL EXAM Vitals:   04/16/23 1040  BP: 115/60  Pulse: (!) 59  Resp: 20  Temp: 97.7 F (36.5  C)  SpO2: 99%  Weight: 208 lb (94.3 kg)  Height: 5\' 6"  (1.676 m)   Elderly woman in no distress Regular rate and rhythm Unlabored breathing 2+ dorsalis pedis pulses bilaterally    PERTINENT LABORATORY AND RADIOLOGIC DATA  Most recent CBC    Latest Ref Rng & Units 01/08/2023   11:39 AM 07/13/2022    2:05 PM 07/09/2022   12:00 AM  CBC  WBC 4.0 - 10.5 K/uL 5.4  7.0  4.9   Hemoglobin 12.0 - 15.0 g/dL 16.1  09.6  04.5   Hematocrit 36.0 - 46.0 % 43.4  44.6  42.8   Platelets 150 - 400 K/uL 170  169  158      Most recent CMP    Latest Ref Rng & Units 01/10/2023    8:18 AM 01/08/2023   11:39 AM 01/08/2023   10:34 AM  CMP  Glucose 70 - 99 mg/dL 95  409  811   BUN 8 - 23 mg/dL 28  18  19    Creatinine 0.44 - 1.00 mg/dL 9.14  7.82  9.56   Sodium 135 - 145 mmol/L 140  142  140   Potassium 3.5 - 5.1 mmol/L 4.7  5.5  5.0   Chloride 98 - 111 mmol/L 103  104  103   CO2 22 - 32 mmol/L 29  30  25    Calcium 8.9 - 10.3 mg/dL 9.7  21.3  9.9   Total Protein 6.5 - 8.1 g/dL  7.7  7.2   Total Bilirubin 0.3 - 1.2 mg/dL  0.5  0.5   Alkaline Phos 38 - 126 U/L  72    AST 15 - 41 U/L  19  20   ALT 0 - 44 U/L  16  15     Renal function CrCl cannot be calculated (Patient's most recent lab result is older than the maximum 21 days allowed.).  Hemoglobin A1C (%)  Date Value  04/08/2023 5.9 (A)   Hgb A1c MFr Bld (% of total Hgb)  Date Value  01/08/2023 5.6    LDL Cholesterol (Calc)  Date Value Ref Range Status  01/08/2023 78 mg/dL (calc) Final    Comment:    Reference range: <100 . Desirable range <100 mg/dL for primary prevention;   <70 mg/dL for patients with CHD or diabetic patients  with > or = 2 CHD risk factors. Marland Kitchen LDL-C is now calculated using the Martin-Hopkins  calculation,  which is a validated novel method providing  better accuracy than the Friedewald equation in the  estimation of LDL-C.  Horald Pollen et al. Lenox Ahr. 0865;784(69): 2061-2068  (http://education.[employer].com/faq/FAQ164)      +-------+-----------+-----------+------------+------------+  ABI/TBIToday's ABIToday's TBIPrevious ABIPrevious TBI  +-------+-----------+-----------+------------+------------+  Right 1.08       0.84                                 +-------+-----------+-----------+------------+------------+  Left  1.12       0.88                                 +-------+-----------+-----------+------------+------------+    Rande Brunt. Lenell Antu, MD FACS [facility] Phone Number: [phone] 04/16/2023 10:59 AM   Total time spent on preparing this encounter including chart review, data review, collecting history, examining the patient, coordinating care for this new patient,  45 minutes.  Portions of this report may have been transcribed using voice recognition software.  Every effort has been made to ensure accuracy; however, inadvertent computerized transcription errors may still be present.

## 2023-04-29 DIAGNOSIS — Z1330 Encounter for screening examination for mental health and behavioral disorders, unspecified: Secondary | ICD-10-CM | POA: Diagnosis not present

## 2023-04-29 DIAGNOSIS — G40109 Localization-related (focal) (partial) symptomatic epilepsy and epileptic syndromes with simple partial seizures, not intractable, without status epilepticus: Secondary | ICD-10-CM | POA: Diagnosis not present

## 2023-04-29 DIAGNOSIS — G629 Polyneuropathy, unspecified: Secondary | ICD-10-CM | POA: Diagnosis not present

## 2023-05-01 ENCOUNTER — Telehealth: Payer: Self-pay | Admitting: Family Medicine

## 2023-05-01 NOTE — Telephone Encounter (Signed)
Patient called in for a refill on gabapentin (NEURONTIN) 300 MG capsule   Please advise  [facility] [address] [place], [place]

## 2023-05-02 ENCOUNTER — Other Ambulatory Visit: Payer: Self-pay | Admitting: Family Medicine

## 2023-05-02 MED ORDER — GABAPENTIN 300 MG PO CAPS: 300.0000 mg | ORAL_CAPSULE | Freq: Three times a day (TID) | ORAL | 3 refills | Status: DC

## 2023-05-02 NOTE — Telephone Encounter (Signed)
Pt son answered pt phone informed him of the medication being sent in today. He voiced his understanding Roselyn Reef, CMA

## 2023-05-06 ENCOUNTER — Telehealth: Payer: Medicare Other | Admitting: Family Medicine

## 2023-05-06 ENCOUNTER — Encounter: Payer: Self-pay | Admitting: Family Medicine

## 2023-05-06 DIAGNOSIS — H6120 Impacted cerumen, unspecified ear: Secondary | ICD-10-CM | POA: Insufficient documentation

## 2023-05-06 DIAGNOSIS — Z23 Encounter for immunization: Secondary | ICD-10-CM | POA: Insufficient documentation

## 2023-05-06 DIAGNOSIS — F5101 Primary insomnia: Secondary | ICD-10-CM | POA: Diagnosis not present

## 2023-05-06 DIAGNOSIS — J309 Allergic rhinitis, unspecified: Secondary | ICD-10-CM | POA: Insufficient documentation

## 2023-05-06 MED ORDER — RAMELTEON 8 MG PO TABS: 8.0000 mg | ORAL_TABLET | Freq: Every day | ORAL | 3 refills | Status: DC

## 2023-05-06 NOTE — Progress Notes (Signed)
Established patient visit   Patient: Darlene Moore   DOB: 26-Oct-1945   77 y.o. Female  MRN: [id number] Visit Date: 05/06/2023  Today's healthcare provider: Charlton Amor, DO   Chief Complaint  Patient presents with   Follow-up    SUBJECTIVE    Chief Complaint  Patient presents with   Follow-up   HPI  I connected with  Darlene Moore on 05/06/23 by a video and audio enabled telemedicine application and verified that I am speaking with the correct person using two identifiers.  Patient Location: Home  Provider Location: Home Office  I discussed the limitations of evaluation and management by telemedicine. The patient expressed understanding and agreed to proceed.  Presents to follow up on trazodone for insomnia. On trazodone 25mg . Says it makes her too groggy and she does not like how it makes her feel.    Review of Systems  Constitutional:  Negative for activity change, fatigue and fever.  Respiratory:  Negative for cough and shortness of breath.   Cardiovascular:  Negative for chest pain.  Gastrointestinal:  Negative for abdominal pain.  Genitourinary:  Negative for difficulty urinating.       Current Meds  Medication Sig   anastrozole (ARIMIDEX) 1 MG tablet TAKE 1 TABLET BY MOUTH DAILY   atorvastatin (LIPITOR) 10 MG tablet Take 1 tablet (10 mg total) by mouth daily.   Calcium Carbonate (CALCIUM 600 PO) Take by mouth 2 (two) times daily.   gabapentin (NEURONTIN) 300 MG capsule Take 1 capsule (300 mg total) by mouth 3 (three) times daily.   Lacosamide 150 MG TABS Take 1 tablet (150 mg total) by mouth in the morning and at bedtime.   lamoTRIgine (LAMICTAL) 25 MG tablet Take 2 tablets (50 mg total) by mouth 2 (two) times daily.   levETIRAcetam (KEPPRA XR) 500 MG 24 hr tablet Take 3 tablets (1,500 mg total) by mouth in the morning and at bedtime.   Multiple Vitamin (MULTIVITAMIN) tablet Take 1 tablet by mouth daily.   Probiotic, Lactobacillus, CAPS Take by  mouth.   ramelteon (ROZEREM) 8 MG tablet Take 1 tablet (8 mg total) by mouth at bedtime.   sertraline (ZOLOFT) 25 MG tablet Take 1 tablet by mouth daily.   VITAMIN D PO Take 5,000 Units by mouth.   Wheat Dextrin (BENEFIBER PO) Take by mouth.    OBJECTIVE    Physical Exam Vitals reviewed.  Constitutional:      Appearance: She is well-developed.  HENT:     Head: Normocephalic and atraumatic.  Eyes:     Conjunctiva/sclera: Conjunctivae normal.  Cardiovascular:     Rate and Rhythm: Normal rate.  Pulmonary:     Effort: Pulmonary effort is normal.  Skin:    General: Skin is dry.     Coloration: Skin is not pale.  Neurological:     Mental Status: She is alert and oriented to person, place, and time.  Psychiatric:        Behavior: Behavior normal.        ASSESSMENT & PLAN    Problem List Items Addressed This Visit       Other   Primary insomnia - Primary    - pt says trazodone gives her grogginess  - will go ahead and try rozerem for insomnia. She has tried melatonin in the past and had little benefit.        No follow-ups on file.      Meds ordered this encounter  Medications   ramelteon (ROZEREM) 8 MG tablet    Sig: Take 1 tablet (8 mg total) by mouth at bedtime.    Dispense:  30 tablet    Refill:  3    No orders of the defined types were placed in this encounter.    Charlton Amor, DO  [facility] at [facility] [phone] (phone) [phone] (fax)  [facility]

## 2023-05-06 NOTE — Assessment & Plan Note (Signed)
-   pt says trazodone gives her grogginess  - will go ahead and try rozerem for insomnia. She has tried melatonin in the past and had little benefit.

## 2023-05-20 ENCOUNTER — Encounter: Payer: Self-pay | Admitting: Family Medicine

## 2023-05-20 MED ORDER — ZALEPLON 10 MG PO CAPS: 10.0000 mg | ORAL_CAPSULE | Freq: Every evening | ORAL | 0 refills | Status: DC | PRN

## 2023-06-03 ENCOUNTER — Other Ambulatory Visit: Payer: Self-pay | Admitting: Family Medicine

## 2023-06-03 DIAGNOSIS — F5101 Primary insomnia: Secondary | ICD-10-CM

## 2023-06-03 MED ORDER — ZALEPLON 5 MG PO CAPS
5.0000 mg | ORAL_CAPSULE | Freq: Every evening | ORAL | 3 refills | Status: DC | PRN
Start: 2023-06-03 — End: 2023-07-09

## 2023-06-11 ENCOUNTER — Encounter: Payer: Self-pay | Admitting: Family Medicine

## 2023-06-11 ENCOUNTER — Ambulatory Visit (INDEPENDENT_AMBULATORY_CARE_PROVIDER_SITE_OTHER): Payer: Medicare Other | Admitting: Family Medicine

## 2023-06-11 ENCOUNTER — Ambulatory Visit: Payer: Medicare Other

## 2023-06-11 ENCOUNTER — Ambulatory Visit: Payer: Medicare Other | Admitting: Family Medicine

## 2023-06-11 ENCOUNTER — Other Ambulatory Visit: Payer: Self-pay | Admitting: Family Medicine

## 2023-06-11 VITALS — BP 99/66 | HR 68 | Ht 66.0 in | Wt 191.0 lb

## 2023-06-11 DIAGNOSIS — Z23 Encounter for immunization: Secondary | ICD-10-CM

## 2023-06-11 DIAGNOSIS — G609 Hereditary and idiopathic neuropathy, unspecified: Secondary | ICD-10-CM

## 2023-06-11 DIAGNOSIS — M1711 Unilateral primary osteoarthritis, right knee: Secondary | ICD-10-CM | POA: Diagnosis not present

## 2023-06-11 DIAGNOSIS — M25561 Pain in right knee: Secondary | ICD-10-CM | POA: Diagnosis not present

## 2023-06-11 DIAGNOSIS — R2689 Other abnormalities of gait and mobility: Secondary | ICD-10-CM

## 2023-06-11 DIAGNOSIS — I959 Hypotension, unspecified: Secondary | ICD-10-CM | POA: Insufficient documentation

## 2023-06-11 NOTE — Assessment & Plan Note (Signed)
-   due to trauma and tenderness of the patella will go ahead and order xray of knee - recommend rest, ice, compression with ace bandage provided. If no better in one week with conservative therapy we can consider seeing Dr. Karie Schwalbe for further management

## 2023-06-11 NOTE — Assessment & Plan Note (Addendum)
Bp readings today in clinic are 99/66 and repeat is 88 SBP. Pt has only drank two cups of coffee this am. Sounds to be dehydration related. She is asymptomatic at this time in my clinic. Recommend she increase fluid intake and have asked son to recheck her blood pressure later this afternoon and get back with [place].

## 2023-06-11 NOTE — Progress Notes (Addendum)
Acute Office Visit  Subjective:     Patient ID: Darlene Moore, female    DOB: 03-01-1946, 77 y.o.   MRN: [id number]  Chief Complaint  Patient presents with   Leg Pain    HPI Patient is in today for concerns of leg pain. She was at home and dropped something from her pill box. She bent down to pick it up and put a lot of pressure on her right knee and caused a significant amount of pain. Pain has been present for a week. She has tried ibuprofen and tylenol for the pain. She is taking a tylenol pm to help the pain and help her sleep.     Review of Systems  Constitutional:  Negative for chills and fever.  Respiratory:  Negative for cough and shortness of breath.   Cardiovascular:  Negative for chest pain.  Musculoskeletal:        R knee pain  Neurological:  Negative for headaches.        Objective:    BP 99/66 (BP Location: Left Arm, Patient Position: Sitting, Cuff Size: Large)   Pulse 68   Ht 5\' 6"  (1.676 m)   Wt 191 lb (86.6 kg)   SpO2 98%   BMI 30.83 kg/m    Physical Exam Vitals and nursing note reviewed.  Constitutional:      General: She is not in acute distress.    Appearance: Normal appearance.  HENT:     Head: Normocephalic and atraumatic.     Right Ear: External ear normal.     Left Ear: External ear normal.     Nose: Nose normal.  Eyes:     Conjunctiva/sclera: Conjunctivae normal.  Cardiovascular:     Rate and Rhythm: Normal rate and regular rhythm.  Pulmonary:     Effort: Pulmonary effort is normal.     Breath sounds: Normal breath sounds.  Musculoskeletal:     Comments: Tenderness to palpation of R patella. Some swelling present medially and laterally  Neurological:     General: No focal deficit present.     Mental Status: She is alert and oriented to person, place, and time.  Psychiatric:        Mood and Affect: Mood normal.        Behavior: Behavior normal.        Thought Content: Thought content normal.        Judgment: Judgment normal.      No results found for any visits on 06/11/23.      Assessment & Plan:   Problem List Items Addressed This Visit       Cardiovascular and Mediastinum   Hypotension    Bp readings today in clinic are 99/66 and repeat is 88 SBP. Pt has only drank two cups of coffee this am. Sounds to be dehydration related. She is asymptomatic at this time in my clinic. Recommend she increase fluid intake and have asked son to recheck her blood pressure later this afternoon and get back with [place].        Nervous and Auditory   Idiopathic peripheral neuropathy     Is currently on gabapentin 300mg  BID for lower extremity neuropathy. Her gabapentin was recently increased by neurology one month ago due to her pain. Pt has also been seen by vascular 3 months ago and had a normal work up indicating no vascular correlation to pain. Has a pmh of prediabetes and A1c is 5.9  Vit d level normal 06/2022 B12  levels normal 03/2023 TSH wnl 03/2023  Have asked pt to see if she can go back to neurology and see if they have other advice or if they can do an emg study to check nerve function        Other   Acute pain of right knee - Primary    - due to trauma and tenderness of the patella will go ahead and order xray of knee - recommend rest, ice, compression with ace bandage provided. If no better in one week with conservative therapy we can consider seeing Dr. Karie Schwalbe for further management      Balance problem    Pt says she has issues with balance and weakness - referral to home health to aid with strengthening exercises - pt homebound due to hx of seizures and being unable to drive      Relevant Orders   Ambulatory referral to Home Health   Other Visit Diagnoses     Encounter for immunization       Relevant Orders   Flu Vaccine Trivalent High Dose (Fluad) (Completed)       No orders of the defined types were placed in this encounter.   Return in about 3 months (around 09/10/2023).  Charlton Amor,  DO

## 2023-06-11 NOTE — Assessment & Plan Note (Signed)
Pt says she has issues with balance and weakness - referral to home health to aid with strengthening exercises - pt homebound due to hx of seizures and being unable to drive

## 2023-06-11 NOTE — Assessment & Plan Note (Signed)
Is currently on gabapentin 300mg  BID for lower extremity neuropathy. Her gabapentin was recently increased by neurology one month ago due to her pain. Pt has also been seen by vascular 3 months ago and had a normal work up indicating no vascular correlation to pain. Has a pmh of prediabetes and A1c is 5.9  Vit d level normal 06/2022 B12 levels normal 03/2023 TSH wnl 03/2023  Have asked pt to see if she can go back to neurology and see if they have other advice or if they can do an emg study to check nerve function

## 2023-06-11 NOTE — Patient Instructions (Signed)
Rest   Ice for on and then off and use ace wrap

## 2023-06-25 ENCOUNTER — Other Ambulatory Visit: Payer: Self-pay | Admitting: Family Medicine

## 2023-07-03 ENCOUNTER — Encounter (HOSPITAL_COMMUNITY): Payer: Self-pay | Admitting: Hematology

## 2023-07-09 ENCOUNTER — Other Ambulatory Visit: Payer: Self-pay

## 2023-07-09 ENCOUNTER — Encounter: Payer: Self-pay | Admitting: Medical Oncology

## 2023-07-09 ENCOUNTER — Inpatient Hospital Stay: Payer: Medicare Other | Admitting: Medical Oncology

## 2023-07-09 ENCOUNTER — Inpatient Hospital Stay: Payer: Medicare Other | Attending: Hematology & Oncology

## 2023-07-09 VITALS — BP 115/54 | HR 50 | Temp 97.6°F | Resp 18 | Ht 66.0 in | Wt 212.0 lb

## 2023-07-09 DIAGNOSIS — C50212 Malignant neoplasm of upper-inner quadrant of left female breast: Secondary | ICD-10-CM

## 2023-07-09 DIAGNOSIS — Z170 Estrogen receptor positive status [ER+]: Secondary | ICD-10-CM | POA: Insufficient documentation

## 2023-07-09 DIAGNOSIS — Z79811 Long term (current) use of aromatase inhibitors: Secondary | ICD-10-CM | POA: Diagnosis not present

## 2023-07-09 DIAGNOSIS — M8000XA Age-related osteoporosis with current pathological fracture, unspecified site, initial encounter for fracture: Secondary | ICD-10-CM | POA: Diagnosis not present

## 2023-07-09 LAB — CBC WITH DIFFERENTIAL (CANCER CENTER ONLY)
Abs Immature Granulocytes: 0.01 10*3/uL (ref 0.00–0.07)
Basophils Absolute: 0 10*3/uL (ref 0.0–0.1)
Basophils Relative: 1 %
Eosinophils Absolute: 0 10*3/uL (ref 0.0–0.5)
Eosinophils Relative: 1 %
HCT: 40 % (ref 36.0–46.0)
Hemoglobin: 13.1 g/dL (ref 12.0–15.0)
Immature Granulocytes: 0 %
Lymphocytes Relative: 24 %
Lymphs Abs: 1.2 10*3/uL (ref 0.7–4.0)
MCH: 30.9 pg (ref 26.0–34.0)
MCHC: 32.8 g/dL (ref 30.0–36.0)
MCV: 94.3 fL (ref 80.0–100.0)
Monocytes Absolute: 0.4 10*3/uL (ref 0.1–1.0)
Monocytes Relative: 9 %
Neutro Abs: 3.1 10*3/uL (ref 1.7–7.7)
Neutrophils Relative %: 65 %
Platelet Count: 144 10*3/uL — ABNORMAL LOW (ref 150–400)
RBC: 4.24 MIL/uL (ref 3.87–5.11)
RDW: 12.3 % (ref 11.5–15.5)
WBC Count: 4.8 10*3/uL (ref 4.0–10.5)
nRBC: 0 % (ref 0.0–0.2)

## 2023-07-09 LAB — CMP (CANCER CENTER ONLY)
ALT: 16 U/L (ref 0–44)
AST: 20 U/L (ref 15–41)
Albumin: 4.2 g/dL (ref 3.5–5.0)
Alkaline Phosphatase: 69 U/L (ref 38–126)
Anion gap: 9 (ref 5–15)
BUN: 21 mg/dL (ref 8–23)
CO2: 28 mmol/L (ref 22–32)
Calcium: 9.7 mg/dL (ref 8.9–10.3)
Chloride: 104 mmol/L (ref 98–111)
Creatinine: 0.85 mg/dL (ref 0.44–1.00)
GFR, Estimated: >60 mL/min (ref >60)
Glucose, Bld: 102 mg/dL — ABNORMAL HIGH (ref 70–99)
Potassium: 4.4 mmol/L (ref 3.5–5.1)
Sodium: 141 mmol/L (ref 135–145)
Total Bilirubin: 0.4 mg/dL (ref 0.3–1.2)
Total Protein: 7.1 g/dL (ref 6.5–8.1)

## 2023-07-09 LAB — LACTATE DEHYDROGENASE: LDH: 174 U/L (ref 98–192)

## 2023-07-09 NOTE — Progress Notes (Signed)
Hematology and Oncology Follow Up Visit  Darlene Moore [id number] 11/17/1945 77 y.o. 07/09/2023  Past Medical History:  Diagnosis Date   Concussion    Family history of breast cancer    Family history of kidney cancer    Family history of ovarian cancer    Family history of stomach cancer    Family history of uterine cancer    High cholesterol    Medical history non-contributory    Seizures (HCC)    last seizure 03/18/20    Principle Diagnosis:  Stage 1a (T1cN0M0) invasive ductal carcinoma of the LEFT breast -- ER+/PR+/HER2- 09/10/2018  Negative genetic testing on the multi-cancer panel.  The Multi-Gene Panel offered by [facility] includes sequencing and/or deletion duplication testing of the following 84 genes: AIP, ALK, APC, ATM, AXIN2,BAP1,  BARD1, BLM, BMPR1A, BRCA1, BRCA2, BRIP1, CASR, CDC73, CDH1, CDK4, CDKN1B, CDKN1C, CDKN2A (p14ARF), CDKN2A (p16INK4a), CEBPA, CHEK2, CTNNA1, DICER1, DIS3L2, EGFR (c.2369C>T, p.Thr790Met variant only), EPCAM (Deletion/duplication testing only), FH, FLCN, GATA2, GPC3, GREM1 (Promoter region deletion/duplication testing only), HOXB13 (c.251G>A, p.Gly84Glu), HRAS, KIT, MAX, MEN1, MET, MITF (c.952G>A, p.Glu318Lys variant only), MLH1, MSH2, MSH3, MSH6, MUTYH, NBN, NF1, NF2, NTHL1, PALB2, PDGFRA, PHOX2B, PMS2, POLD1, POLE, POT1, PRKAR1A, PTCH1, PTEN, RAD50, RAD51C, RAD51D, RB1, RECQL4, RET, RUNX1, SDHAF2, SDHA (sequence changes only), SDHB, SDHC, SDHD, SMAD4, SMARCA4, SMARCB1, SMARCE1, STK11, SUFU, TERC, TERT, TMEM127, TP53, TSC1, TSC2, VHL, WRN and WT1.  The report date is November 14, 2018   Current Therapy:   Anastrozole x 5 years- 10/28/2018-10/2023 Surveillance   PriorTherapy:   Lumpectomy XRT     Interim History:  Darlene Moore is back for follow-up for her 6 month follow up for her history of stage 1a breast cancer.   She reports that she has been good. Slightly tired as she has increased her activity level to help with some weight gain that she  has had.   Her last mammogram was in January 2024 which was non-concerning. She denies any breast changes, night sweats, unintentional weight loss.   She does have osteoporosis which is managed by her PCP. Last DEXA was on 06/07/2021.      Wt Readings from Last 3 Encounters:  07/09/23 212 lb (96.2 kg)  06/11/23 191 lb (86.6 kg)  04/16/23 208 lb (94.3 kg)     Medications:   Current Outpatient Medications:    anastrozole (ARIMIDEX) 1 MG tablet, TAKE 1 TABLET BY MOUTH DAILY, Disp: 90 tablet, Rfl: 3   atorvastatin (LIPITOR) 10 MG tablet, Take 1 tablet (10 mg total) by mouth daily., Disp: 90 tablet, Rfl: 3   Calcium Carbonate (CALCIUM 600 PO), Take by mouth 2 (two) times daily., Disp: , Rfl:    gabapentin (NEURONTIN) 300 MG capsule, TAKE ONE CAPSULE BY MOUTH TWICE DAILY AS NEEDED FOR NERVE PAIN GENERIC EQUIVALENT FOR NEURONTIN, Disp: 90 capsule, Rfl: 3   Lacosamide 150 MG TABS, Take 1 tablet (150 mg total) by mouth in the morning and at bedtime., Disp: 180 tablet, Rfl: 1   lamoTRIgine (LAMICTAL) 25 MG tablet, Take 2 tablets (50 mg total) by mouth 2 (two) times daily., Disp: 360 tablet, Rfl: 3   levETIRAcetam (KEPPRA XR) 500 MG 24 hr tablet, Take 3 tablets (1,500 mg total) by mouth in the morning and at bedtime., Disp: 540 tablet, Rfl: 2   Multiple Vitamin (MULTIVITAMIN) tablet, Take 1 tablet by mouth daily., Disp: , Rfl:    Probiotic, Lactobacillus, CAPS, Take by mouth., Disp: , Rfl:    ramelteon (ROZEREM) 8 MG  tablet, Take 1 tablet (8 mg total) by mouth at bedtime., Disp: 30 tablet, Rfl: 3   sertraline (ZOLOFT) 25 MG tablet, Take 1 tablet by mouth daily., Disp: , Rfl:    VITAMIN D PO, Take 5,000 Units by mouth., Disp: , Rfl:    Wheat Dextrin (BENEFIBER PO), Take by mouth., Disp: , Rfl:   Allergies: No Known Allergies  Past Medical History, Surgical history, Social history, and Family History were reviewed and updated.  Review of Systems: Review of Systems  Constitutional:  Negative  for unexpected weight change.  Respiratory:  Negative for shortness of breath.   Cardiovascular:  Negative for chest pain and leg swelling.  Gastrointestinal:  Negative for abdominal pain and blood in stool.  Genitourinary:  Negative for hematuria.   Skin:  Negative for rash.  Neurological:  Negative for headaches.     Physical Exam:  height is 5\' 6"  (1.676 m) and weight is 212 lb (96.2 kg). Her oral temperature is 97.6 F (36.4 C). Her blood pressure is 115/54 (abnormal) and her pulse is 50 (abnormal). Her respiration is 18 and oxygen saturation is 100%.   Physical Exam General: NAD Cardiovascular: regular rate and rhythm Pulmonary: clear ant fields Breast: Breast exam shows right breast with no masses, edema or erythema.  There is no right axillary adenopathy.  Left breast shows well-healed lumpectomy at about the 9 o'clock position.  There is no distinct mass in the left breast.  There is no left axillary adenopathy.  Extremities: no edema, no joint deformities Skin: no rashes Neurological: Weakness but otherwise nonfocal   Lab Results  Component Value Date   WBC 4.8 07/09/2023   HGB 13.1 07/09/2023   HCT 40.0 07/09/2023   MCV 94.3 07/09/2023   PLT 144 (L) 07/09/2023     Chemistry      Component Value Date/Time   NA 140 01/10/2023 0818   K 4.7 01/10/2023 0818   CL 103 01/10/2023 0818   CO2 29 01/10/2023 0818   BUN 28 (H) 01/10/2023 0818   CREATININE 0.89 01/10/2023 0818   CREATININE 0.80 01/08/2023 1034      Component Value Date/Time   CALCIUM 9.7 01/10/2023 0818   ALKPHOS 72 01/08/2023 1139   AST 19 01/08/2023 1139   AST 20 01/08/2023 1034   ALT 16 01/08/2023 1139   ALT 15 01/08/2023 1034   BILITOT 0.5 01/08/2023 1139   BILITOT 0.5 01/08/2023 1034     Encounter Diagnoses  Name Primary?   Age-related osteoporosis with current pathological fracture, initial encounter Yes   Malignant neoplasm of upper-inner quadrant of left breast in female, estrogen receptor  positive (HCC)     Assessment and Plan- Patient is a 77 y.o. female with a history of Stage 1a (T1cN0M0) invasive ductal carcinoma of the LEFT breast -- ER+/PR+/HER2- 2019. Currently on anastrozole with target completion date of 10/2023.    She appears to be doing well. She will need to continue to see her specialist and PCP for her chronic health conditions and osteoporosis.   No evidence or recurrence on exam, in history or on most recent mammogram. She will continue taking her anastrozole medication. We will plan to see her again in 6 months or sooner as needed.   Disposition:  RTC 6 months MD, labs    Clent Jacks PA-C 10/15/202412:08 PM

## 2023-07-10 ENCOUNTER — Ambulatory Visit: Payer: Medicare Other | Admitting: Family Medicine

## 2023-07-10 LAB — CANCER ANTIGEN 27.29: CA 27.29: 34.2 U/mL (ref 0.0–38.6)

## 2023-08-07 DIAGNOSIS — G629 Polyneuropathy, unspecified: Secondary | ICD-10-CM | POA: Diagnosis not present

## 2023-08-07 DIAGNOSIS — C50212 Malignant neoplasm of upper-inner quadrant of left female breast: Secondary | ICD-10-CM | POA: Diagnosis not present

## 2023-08-07 DIAGNOSIS — G40109 Localization-related (focal) (partial) symptomatic epilepsy and epileptic syndromes with simple partial seizures, not intractable, without status epilepticus: Secondary | ICD-10-CM | POA: Diagnosis not present

## 2023-08-27 DIAGNOSIS — K080 Exfoliation of teeth due to systemic causes: Secondary | ICD-10-CM | POA: Diagnosis not present

## 2023-09-03 ENCOUNTER — Other Ambulatory Visit: Payer: Self-pay

## 2023-09-03 DIAGNOSIS — G40109 Localization-related (focal) (partial) symptomatic epilepsy and epileptic syndromes with simple partial seizures, not intractable, without status epilepticus: Secondary | ICD-10-CM

## 2023-10-10 ENCOUNTER — Other Ambulatory Visit: Payer: Self-pay

## 2023-10-10 ENCOUNTER — Encounter: Payer: Self-pay | Admitting: Physical Therapy

## 2023-10-10 ENCOUNTER — Ambulatory Visit: Payer: Medicare Other | Attending: Family Medicine | Admitting: Physical Therapy

## 2023-10-10 ENCOUNTER — Ambulatory Visit (INDEPENDENT_AMBULATORY_CARE_PROVIDER_SITE_OTHER): Payer: Medicare Other | Admitting: Family Medicine

## 2023-10-10 VITALS — BP 141/84 | HR 61 | Ht 66.0 in | Wt 210.5 lb

## 2023-10-10 DIAGNOSIS — R7303 Prediabetes: Secondary | ICD-10-CM

## 2023-10-10 DIAGNOSIS — R42 Dizziness and giddiness: Secondary | ICD-10-CM | POA: Insufficient documentation

## 2023-10-10 DIAGNOSIS — H6122 Impacted cerumen, left ear: Secondary | ICD-10-CM

## 2023-10-10 MED ORDER — MECLIZINE HCL 25 MG PO TABS
25.0000 mg | ORAL_TABLET | Freq: Three times a day (TID) | ORAL | 0 refills | Status: DC | PRN
Start: 2023-10-10 — End: 2023-11-04

## 2023-10-10 NOTE — Assessment & Plan Note (Signed)
Pt presents with dizziness for years.physical exam pretty nonspecific however pt unable to do dix hallpike due to dizziness when trying to get on exam table so we have deferred this. Will go ahead and treat with meclizine tid prn and have her follow up with PT vestibular therapy. Have ordered blood work to see if there is any elyte abnormality or other metabolic cause for dizziness - recommended increasing fluids

## 2023-10-10 NOTE — Assessment & Plan Note (Signed)
Will get A1c

## 2023-10-10 NOTE — Progress Notes (Signed)
Acute Office Visit  Subjective:     Patient ID: Darlene Moore, female    DOB: 1946/09/02, 78 y.o.   MRN: [id number]  Chief Complaint  Patient presents with   Dizziness    X2-3 wks    HPI Patient is in today for acute concerns for dizziness. BP today is 141/84. Says this has been going on for years. Does note it is worse when she gets up. Notes she feels "wobbly." She has not fallen. Does say it is worse in the am and says the dizziness will come and go. It will be around for a few months and then go away and come back. She has seen ENT in the past for this but can't remember what they said.  Review of Systems  Constitutional:  Negative for chills and fever.  Respiratory:  Negative for cough and shortness of breath.   Cardiovascular:  Negative for chest pain.  Neurological:  Positive for dizziness. Negative for headaches.        Objective:    BP (!) 141/84 (BP Location: Left Arm, Patient Position: Sitting, Cuff Size: Large)   Pulse 61   Ht 5\' 6"  (1.676 m)   Wt 210 lb 8 oz (95.5 kg)   SpO2 99%   BMI 33.98 kg/m    Physical Exam Vitals and nursing note reviewed.  Constitutional:      General: She is not in acute distress.    Appearance: Normal appearance.  HENT:     Head: Normocephalic and atraumatic.     Right Ear: Tympanic membrane, ear canal and external ear normal.     Left Ear: External ear normal.     Ears:     Comments: Left cerumen impaction    Nose: Nose normal.  Eyes:     Conjunctiva/sclera: Conjunctivae normal.  Cardiovascular:     Rate and Rhythm: Normal rate and regular rhythm.  Pulmonary:     Effort: Pulmonary effort is normal.     Breath sounds: Normal breath sounds.  Neurological:     General: No focal deficit present.     Mental Status: She is alert and oriented to person, place, and time.  Psychiatric:        Mood and Affect: Mood normal.        Behavior: Behavior normal.        Thought Content: Thought content normal.        Judgment:  Judgment normal.     Results for orders placed or performed in visit on 10/10/23  CMP14+EGFR  Result Value Ref Range   Glucose 98 70 - 99 mg/dL   BUN 17 8 - 27 mg/dL   Creatinine, Ser 8.75 0.57 - 1.00 mg/dL   eGFR 78 >64 PP/IRJ/1.88   BUN/Creatinine Ratio 22 12 - 28   Sodium 144 134 - 144 mmol/L   Potassium 4.7 3.5 - 5.2 mmol/L   Chloride 105 96 - 106 mmol/L   CO2 23 20 - 29 mmol/L   Calcium 9.6 8.7 - 10.3 mg/dL   Total Protein 6.6 6.0 - 8.5 g/dL   Albumin 4.4 3.8 - 4.8 g/dL   Globulin, Total 2.2 1.5 - 4.5 g/dL   Bilirubin Total 0.3 0.0 - 1.2 mg/dL   Alkaline Phosphatase 82 44 - 121 IU/L   AST 23 0 - 40 IU/L   ALT 18 0 - 32 IU/L  CBC with Differential  Result Value Ref Range   WBC 6.2 3.4 - 10.8 x10E3/uL  RBC 4.54 3.77 - 5.28 x10E6/uL   Hemoglobin 13.8 11.1 - 15.9 g/dL   Hematocrit 34.7 42.5 - 46.6 %   MCV 92 79 - 97 fL   MCH 30.4 26.6 - 33.0 pg   MCHC 33.0 31.5 - 35.7 g/dL   RDW 95.6 38.7 - 56.4 %   Platelets 158 150 - 450 x10E3/uL   Neutrophils 75 Not Estab. %   Lymphs 15 Not Estab. %   Monocytes 7 Not Estab. %   Eos 2 Not Estab. %   Basos 1 Not Estab. %   Neutrophils Absolute 4.6 1.4 - 7.0 x10E3/uL   Lymphocytes Absolute 0.9 0.7 - 3.1 x10E3/uL   Monocytes Absolute 0.4 0.1 - 0.9 x10E3/uL   EOS (ABSOLUTE) 0.1 0.0 - 0.4 x10E3/uL   Basophils Absolute 0.1 0.0 - 0.2 x10E3/uL   Immature Granulocytes 0 Not Estab. %   Immature Grans (Abs) 0.0 0.0 - 0.1 x10E3/uL  HgB A1c  Result Value Ref Range   Hgb A1c MFr Bld 5.7 (H) 4.8 - 5.6 %   Est. average glucose Bld gHb Est-mCnc 117 mg/dL        Assessment & Plan:   Problem List Items Addressed This Visit       Nervous and Auditory   Cerumen impaction   Recommended otc ear wax removal kit  - could be part of her dizziness but will wait to see what else comes back on lab work         Other   Prediabetes - Primary   Will get A1c       Relevant Orders   HgB A1c (Completed)   Dizziness   Pt presents with  dizziness for years.physical exam pretty nonspecific however pt unable to do dix hallpike due to dizziness when trying to get on exam table so we have deferred this. Will go ahead and treat with meclizine tid prn and have her follow up with PT vestibular therapy. Have ordered blood work to see if there is any elyte abnormality or other metabolic cause for dizziness - recommended increasing fluids       Relevant Medications   meclizine (ANTIVERT) 25 MG tablet   Other Relevant Orders   CMP14+EGFR (Completed)   CBC with Differential (Completed)   Ambulatory referral to Physical Therapy    Meds ordered this encounter  Medications   meclizine (ANTIVERT) 25 MG tablet    Sig: Take 1 tablet (25 mg total) by mouth 3 (three) times daily as needed for dizziness.    Dispense:  30 tablet    Refill:  0    Return in about 4 weeks (around 11/07/2023) for dizziness.  Charlton Amor, DO

## 2023-10-10 NOTE — Patient Instructions (Signed)
Try an over the counter ear wash kit to flush out the wax

## 2023-10-10 NOTE — Assessment & Plan Note (Signed)
Recommended otc ear wax removal kit  - could be part of her dizziness but will wait to see what else comes back on lab work

## 2023-10-10 NOTE — Therapy (Signed)
OUTPATIENT PHYSICAL THERAPY VESTIBULAR EVALUATION     Patient Name: Darlene Moore MRN: [id number] DOB:1946-01-17, 78 y.o., female Today's Date: 10/10/2023  END OF SESSION:  PT End of Session - 10/10/23 1133     Visit Number 1    Number of Visits 8    Date for PT Re-Evaluation 12/05/23    Authorization Type BCBS Medicare    PT Start Time 1028    PT Stop Time 1105    PT Time Calculation (min) 37 min    Activity Tolerance Patient tolerated treatment well    Behavior During Therapy WFL for tasks assessed/performed             Past Medical History:  Diagnosis Date   Concussion    Family history of breast cancer    Family history of kidney cancer    Family history of ovarian cancer    Family history of stomach cancer    Family history of uterine cancer    High cholesterol    Medical history non-contributory    Seizures (HCC)    last seizure 03/18/20   Past Surgical History:  Procedure Laterality Date   CHOLECYSTECTOMY     COLONOSCOPY     COLONOSCOPY N/A 09/29/2015   Procedure: COLONOSCOPY;  Surgeon: Malissa Hippo, MD;  Location: [facility];  Service: Endoscopy;  Laterality: N/A;  730 - moved to 9:00 - Ann to notify pt   PARTIAL MASTECTOMY WITH NEEDLE LOCALIZATION AND AXILLARY SENTINEL LYMPH NODE BX Left 10/01/2018   Procedure: LEFT BREAST PARTIAL MASTECTOMY WITH SENTINEL LYMPH NODE BIOPSY AFTER NEEDLE LOCALIZATION AND RADIOTRACER PLACEMENT;  Surgeon: Lucretia Roers, MD;  Location: [facility];  Service: General;  Laterality: Left;   Patient Active Problem List   Diagnosis Date Noted   Dizziness 10/10/2023   Acute pain of right knee 06/11/2023   Balance problem 06/11/2023   Hypotension 06/11/2023   Allergic rhinitis 05/06/2023   Cerumen impaction 05/06/2023   Flu vaccine need 05/06/2023   Seizures (HCC) 04/08/2023   Primary insomnia 04/08/2023   Prediabetes 01/08/2023   Idiopathic peripheral neuropathy 01/08/2023   Combined forms of age-related cataract of  left eye 08/28/2022   Osteoporosis 06/12/2021   Age-related osteoporosis with current pathological fracture 06/12/2021   Genetic testing 11/20/2018   Family history of breast cancer    Family history of ovarian cancer    Family history of uterine cancer    Family history of stomach cancer    Family history of kidney cancer    Therapeutic drug monitoring 09/22/2018   Breast cancer of upper-inner quadrant of left female breast (HCC) 09/10/2018   Temporal lobe epilepsy (HCC) 07/28/2018   Closed nondisplaced fracture of sixth cervical vertebra with routine healing 09/18/2017   Dysfunction of eustachian tube 12/15/2013    PCP: Tamera Punt REFERRING PROVIDER: Tamera Punt  REFERRING DIAG: dizziness  THERAPY DIAG:  Dizziness and giddiness  ONSET DATE: December 2024  Rationale for Evaluation and Treatment: Rehabilitation  SUBJECTIVE:   SUBJECTIVE STATEMENT: Pt states that she has been dizzy for about "2 years". She states that in the last month it has been worse. She is most "wobbly" in the mornings but she still has dizziness throughout the day. The dizziness is constant, does not get better. Dizziness does get worse with laying supine or laying back in the recliner. Pt reports some difficulty focusing eyes especially when reading. She does have history of cataract surgery. Pt accompanied by: family member  PERTINENT HISTORY: epilepsy  PAIN:  Are you having pain? No  PRECAUTIONS: Fall  RED FLAGS: None   WEIGHT BEARING RESTRICTIONS: No  FALLS: Has patient fallen in last 6 months? Yes. Number of falls unknown  LIVING ENVIRONMENT: Lives with: lives alone Lives in: Other independent living facility Stairs: No Has following equipment at home: None  PLOF: Independent  PATIENT GOALS: decrease dizziness  OBJECTIVE:  Note: Objective measures were completed at Evaluation unless otherwise noted.  DIAGNOSTIC FINDINGS: head CT 2022: No acute or traumatic finding. Cerebellar more than  cerebral atrophy. No focal insult visible by CT.  COGNITION: Overall cognitive status: Within functional limits for tasks assessed    GAIT:  Comments: wide BOS, forward flexed trunk, decreased cadence   VESTIBULAR ASSESSMENT:    SYMPTOM BEHAVIOR:  Subjective history: see above  Non-Vestibular symptoms: headaches  Type of dizziness: Spinning/Vertigo  Frequency: daily  Duration: seconds  Aggravating factors: Induced by position change: lying supine and Worse in the morning  Relieving factors: no known relieving factors  Progression of symptoms: unchanged  OCULOMOTOR EXAM:  Ocular Alignment: normal  Ocular ROM: No Limitations  Spontaneous Nystagmus: absent  Gaze-Induced Nystagmus: absent  Smooth Pursuits: intact - mild increase in symptoms  Saccades: hypometric/undershoots     VESTIBULAR - OCULAR REFLEX:   Slow VOR: Comment: positive with head nods  VOR Cancellation: Unable to Maintain Gaze  Head-Impulse Test: HIT Right: negative HIT Left: positive     POSITIONAL TESTING: Right Dix-Hallpike: no nystagmus Left Dix-Hallpike: no nystagmus Right Roll Test: no nystagmus Left Roll Test: no nystagmus  MOTION SENSITIVITY:  Motion Sensitivity Quotient Intensity: 0 = none, 1 = Lightheaded, 2 = Mild, 3 = Moderate, 4 = Severe, 5 = Vomiting  Intensity  1. Sitting to supine 3  2. Supine to L side   3. Supine to R side   4. Supine to sitting 3  5. L Hallpike-Dix   6. Up from L    7. R Hallpike-Dix   8. Up from R    9. Sitting, head tipped to L knee   10. Head up from L knee   11. Sitting, head tipped to R knee   12. Head up from R knee   13. Sitting head turns x5   14.Sitting head nods x5   15. In stance, 180 turn to L    16. In stance, 180 turn to R                                                                                                                                TREATMENT DATE: 10/10/23  Gaze Adaptation:  x1 Viewing Vertical:  Reps:  10 Habituation:   Other: supine <> sit x 5 with HOB elevated  PATIENT EDUCATION: Education details: PT POC and goals, HEP Person educated: Patient Education method: Explanation, Demonstration, and Handouts Education comprehension: verbalized understanding and returned demonstration  HOME EXERCISE PROGRAM: Access Code: [email or web address] URL: [email or web address] Date: 10/10/2023 Prepared by:  Reggy Eye  Exercises - Seated Gaze Stabilization with Head Nod  - 3 x daily - 7 x weekly - 3 sets - 10 reps - Long Sitting to Supine Vestibular Habituation  - 1 x daily - 7 x weekly - 3 sets - 10 reps  GOALS: Goals reviewed with patient? Yes  SHORT TERM GOALS: Target date: 11/07/2023    Pt will be independent in initial HEP Baseline: Goal status: INITIAL    LONG TERM GOALS: Target date: 12/05/2023    Pt will be independent with advanced HEP Baseline:  Goal status: INITIAL  2.  Pt will tolerate supine to sit with dizziness <= 1/5 Baseline: 3/5 Goal status: INITIAL  3.  Pt will report dizziness <= 2/7 days per week Baseline: 7/7 days Goal status: INITIAL    ASSESSMENT:  CLINICAL IMPRESSION: Patient is a 78 y.o. female who was seen today for physical therapy evaluation and treatment for dizziness. Pt presents with decreased VOR, vestibular hypofunction and decreased tolerance to position changes as well as decreased mobility and impaired gait. Pt will benefit from skilled PT to address deficits, reduce risk of falls and improve activity tolerance.   OBJECTIVE IMPAIRMENTS: decreased activity tolerance, decreased balance, and dizziness.   ACTIVITY LIMITATIONS: transfers, bed mobility, and locomotion level  PARTICIPATION LIMITATIONS: cleaning, laundry, and community activity  PERSONAL FACTORS: Past/current experiences, Time since onset of injury/illness/exacerbation, and 1-2 comorbidities: visual impairments, history of epilepsy  are also affecting patient's  functional outcome.   REHAB POTENTIAL: Good  CLINICAL DECISION MAKING: Evolving/moderate complexity  EVALUATION COMPLEXITY: Moderate   PLAN:  PT FREQUENCY: 1x/week  PT DURATION: 8 weeks  PLANNED INTERVENTIONS: 97164- PT Re-evaluation, 97110-Therapeutic exercises, 97530- Therapeutic activity, 97112- Neuromuscular re-education, 97535- Self Care, [id number]- Manual therapy, [phone]- Canalith repositioning, Patient/Family education, Balance training, and Vestibular training  PLAN FOR NEXT SESSION: assess balance and make goals, assess response to HEP for habituation and VOR   Celsey Asselin, PT 10/10/2023, 11:34 AM

## 2023-10-11 ENCOUNTER — Encounter: Payer: Self-pay | Admitting: Family Medicine

## 2023-10-11 LAB — CBC WITH DIFFERENTIAL/PLATELET
Basophils Absolute: 0.1 10*3/uL (ref 0.0–0.2)
Basos: 1 %
EOS (ABSOLUTE): 0.1 10*3/uL (ref 0.0–0.4)
Eos: 2 %
Hematocrit: 41.8 % (ref 34.0–46.6)
Hemoglobin: 13.8 g/dL (ref 11.1–15.9)
Immature Grans (Abs): 0 10*3/uL (ref 0.0–0.1)
Immature Granulocytes: 0 %
Lymphocytes Absolute: 0.9 10*3/uL (ref 0.7–3.1)
Lymphs: 15 %
MCH: 30.4 pg (ref 26.6–33.0)
MCHC: 33 g/dL (ref 31.5–35.7)
MCV: 92 fL (ref 79–97)
Monocytes Absolute: 0.4 10*3/uL (ref 0.1–0.9)
Monocytes: 7 %
Neutrophils Absolute: 4.6 10*3/uL (ref 1.4–7.0)
Neutrophils: 75 %
Platelets: 158 10*3/uL (ref 150–450)
RBC: 4.54 x10E6/uL (ref 3.77–5.28)
RDW: 11.9 % (ref 11.7–15.4)
WBC: 6.2 10*3/uL (ref 3.4–10.8)

## 2023-10-11 LAB — HEMOGLOBIN A1C
Est. average glucose Bld gHb Est-mCnc: 117 mg/dL
Hgb A1c MFr Bld: 5.7 % — ABNORMAL HIGH (ref 4.8–5.6)

## 2023-10-11 LAB — CMP14+EGFR
ALT: 18 [IU]/L (ref 0–32)
AST: 23 [IU]/L (ref 0–40)
Albumin: 4.4 g/dL (ref 3.8–4.8)
Alkaline Phosphatase: 82 [IU]/L (ref 44–121)
BUN/Creatinine Ratio: 22 (ref 12–28)
BUN: 17 mg/dL (ref 8–27)
Bilirubin Total: 0.3 mg/dL (ref 0.0–1.2)
CO2: 23 mmol/L (ref 20–29)
Calcium: 9.6 mg/dL (ref 8.7–10.3)
Chloride: 105 mmol/L (ref 96–106)
Creatinine, Ser: 0.79 mg/dL (ref 0.57–1.00)
Globulin, Total: 2.2 g/dL (ref 1.5–4.5)
Glucose: 98 mg/dL (ref 70–99)
Potassium: 4.7 mmol/L (ref 3.5–5.2)
Sodium: 144 mmol/L (ref 134–144)
Total Protein: 6.6 g/dL (ref 6.0–8.5)
eGFR: 78 mL/min/{1.73_m2} (ref >59)

## 2023-10-16 ENCOUNTER — Other Ambulatory Visit: Payer: Self-pay | Admitting: Family Medicine

## 2023-10-16 ENCOUNTER — Ambulatory Visit: Payer: Medicare Other

## 2023-10-16 ENCOUNTER — Encounter: Payer: Self-pay | Admitting: Family Medicine

## 2023-10-16 ENCOUNTER — Ambulatory Visit (INDEPENDENT_AMBULATORY_CARE_PROVIDER_SITE_OTHER): Payer: Medicare Other | Admitting: Family Medicine

## 2023-10-16 VITALS — Ht 66.0 in | Wt 200.0 lb

## 2023-10-16 DIAGNOSIS — Z780 Asymptomatic menopausal state: Secondary | ICD-10-CM

## 2023-10-16 DIAGNOSIS — Z0000 Encounter for general adult medical examination without abnormal findings: Secondary | ICD-10-CM | POA: Diagnosis not present

## 2023-10-16 DIAGNOSIS — R42 Dizziness and giddiness: Secondary | ICD-10-CM

## 2023-10-16 NOTE — Telephone Encounter (Signed)
Copied from CRM [id number]. Topic: Clinical - Medication Refill >> Oct 16, 2023  3:51 PM Nila Nephew wrote: Most Recent Primary Care Visit:  Provider: Charlton Amor  Department: [facility]  Visit Type: PHYSICAL/AWV  Date: 10/10/2023  Medication:  meclizine (ANTIVERT) 25 MG tablet   Has the patient contacted their pharmacy? Yes - unable to get through to pharmacy  Is this the correct pharmacy for this prescription? Yes If no, delete pharmacy and type the correct one.  This is the patient's preferred pharmacy:   [phone] - [place], [place] - [address] [place] [address] Phone: [phone] Fax: [phone]   Has the prescription been filled recently? Yes  Is the patient out of the medication? No - twelve pills left (takes two in morning and two in afternoon)  Has the patient been seen for an appointment in the last year OR does the patient have an upcoming appointment? Yes  Can we respond through MyChart? Yes  Agent: Please be advised that Rx refills may take up to 3 business days. We ask that you follow-up with your pharmacy.

## 2023-10-16 NOTE — Progress Notes (Signed)
Subjective:   Darlene Moore is a 78 y.o. female who presents for Medicare Annual (Subsequent) preventive examination.  Visit Complete: Virtual I connected with  Darlene Moore on 10/16/23 by a audio enabled telemedicine application and verified that I am speaking with the correct person using two identifiers.  Patient Location: Home  Provider Location: Office/Clinic  I discussed the limitations of evaluation and management by telemedicine. The patient expressed understanding and agreed to proceed.  Vital Signs: Because this visit was a virtual/telehealth visit, some criteria may be missing or patient reported. Any vitals not documented were not able to be obtained and vitals that have been documented are patient reported.  Patient Medicare AWV questionnaire was completed by the patient on 10/12/2023; I have confirmed that all information answered by patient is correct and no changes since this date.  Cardiac Risk Factors include: advanced age (>32men, >75 women);obesity (BMI >30kg/m2);dyslipidemia     Objective:    Today's Vitals   10/16/23 1435  Weight: 200 lb (90.7 kg)  Height: 5\' 6"  (1.676 m)   Body mass index is 32.28 kg/m.     10/16/2023    2:49 PM 07/09/2023   11:51 AM 01/08/2023    1:05 PM 07/13/2022    2:38 PM 07/09/2022   10:50 AM 01/10/2022    1:59 PM 12/28/2021   12:08 PM  Advanced Directives  Does Patient Have a Medical Advance Directive? Yes Yes Yes Yes No No No  Type of Estate agent of [place];Living will Healthcare Power of [place];Living will Healthcare Power of [employer] of [place];Living will  Healthcare Power of [place];Living will   Does patient want to make changes to medical advance directive? No - Patient declined No - Patient declined       Copy of Healthcare Power of Attorney in Chart?  No - copy requested No - copy requested   No - copy requested   Would patient like information on creating a medical  advance directive?     No - Patient declined No - Patient declined No - Patient declined    Current Medications (verified) Outpatient Encounter Medications as of 10/16/2023  Medication Sig   anastrozole (ARIMIDEX) 1 MG tablet TAKE 1 TABLET BY MOUTH DAILY   atorvastatin (LIPITOR) 10 MG tablet Take 1 tablet (10 mg total) by mouth daily.   Calcium Carbonate (CALCIUM 600 PO) Take by mouth 2 (two) times daily.   gabapentin (NEURONTIN) 300 MG capsule TAKE ONE CAPSULE BY MOUTH TWICE DAILY AS NEEDED FOR NERVE PAIN GENERIC EQUIVALENT FOR NEURONTIN   Lacosamide 150 MG TABS Take 1 tablet (150 mg total) by mouth in the morning and at bedtime.   lamoTRIgine (LAMICTAL) 25 MG tablet Take 2 tablets (50 mg total) by mouth 2 (two) times daily.   levETIRAcetam (KEPPRA XR) 500 MG 24 hr tablet Take 3 tablets (1,500 mg total) by mouth in the morning and at bedtime.   meclizine (ANTIVERT) 25 MG tablet Take 1 tablet (25 mg total) by mouth 3 (three) times daily as needed for dizziness.   Multiple Vitamin (MULTIVITAMIN) tablet Take 1 tablet by mouth daily.   sertraline (ZOLOFT) 25 MG tablet Take 1 tablet by mouth daily.   VITAMIN D PO Take 5,000 Units by mouth.   Probiotic, Lactobacillus, CAPS Take by mouth. (Patient not taking: Reported on 10/16/2023)   ramelteon (ROZEREM) 8 MG tablet Take 1 tablet (8 mg total) by mouth at bedtime. (Patient not taking: Reported on 10/16/2023)  Wheat Dextrin (BENEFIBER PO) Take by mouth. (Patient not taking: Reported on 10/16/2023)   No facility-administered encounter medications on file as of 10/16/2023.    Allergies (verified) Patient has no known allergies.   History: Past Medical History:  Diagnosis Date   Concussion    Family history of breast cancer    Family history of kidney cancer    Family history of ovarian cancer    Family history of stomach cancer    Family history of uterine cancer    High cholesterol    Medical history non-contributory    Seizures (HCC)     last seizure 03/18/20   Past Surgical History:  Procedure Laterality Date   CHOLECYSTECTOMY     COLONOSCOPY     COLONOSCOPY N/A 09/29/2015   Procedure: COLONOSCOPY;  Surgeon: Malissa Hippo, MD;  Location: [facility];  Service: Endoscopy;  Laterality: N/A;  730 - moved to 9:00 - Ann to notify pt   PARTIAL MASTECTOMY WITH NEEDLE LOCALIZATION AND AXILLARY SENTINEL LYMPH NODE BX Left 10/01/2018   Procedure: LEFT BREAST PARTIAL MASTECTOMY WITH SENTINEL LYMPH NODE BIOPSY AFTER NEEDLE LOCALIZATION AND RADIOTRACER PLACEMENT;  Surgeon: Lucretia Roers, MD;  Location: [facility];  Service: General;  Laterality: Left;   Family History  Problem Relation Age of Onset   Diabetes Mother    Heart disease Father        d. 46   Breast cancer Sister 9       d. 35   Liver cancer Sister    Breast cancer Sister 32       double mastectomy   Kidney cancer Maternal Aunt    Kidney cancer Maternal Uncle    Stomach cancer Maternal Uncle    Brain cancer Paternal Aunt    Kidney cancer Paternal Aunt    Kidney cancer Paternal Aunt    Bladder Cancer Paternal Aunt    Skin cancer Paternal Aunt        on ankle; d. 99   Uterine cancer Maternal Grandmother    Leukemia Paternal Grandmother    Ovarian cancer Niece 51   Uterine cancer Niece 46   Stomach cancer Niece 80   Seizures Other        maternal   Social History   Socioeconomic History   Marital status: Widowed    Spouse name: Not on file   Number of children: 1   Years of education: 72   Highest education level: 12th grade  Occupational History   Not on file  Tobacco Use   Smoking status: Never   Smokeless tobacco: Never  Vaping Use   Vaping status: Never Used  Substance and Sexual Activity   Alcohol use: No   Drug use: No   Sexual activity: Not on file  Other Topics Concern   Not on file  Social History Narrative   07/2019  Lives home alone. Widow.   Retired/ not working.   Education 12th grade.  Children 1.  Drinks decaff coffee, tea.     Social Drivers of Corporate investment banker Strain: Low Risk  (10/16/2023)   Overall Financial Resource Strain (CARDIA)    Difficulty of Paying Living Expenses: Not hard at all  Food Insecurity: No Food Insecurity (10/16/2023)   Hunger Vital Sign    Worried About Running Out of Food in the Last Year: Never true    Ran Out of Food in the Last Year: Never true  Transportation Needs: No Transportation Needs (10/16/2023)  PRAPARE - Administrator, Civil Service (Medical): No    Lack of Transportation (Non-Medical): No  Physical Activity: Insufficiently Active (10/16/2023)   Exercise Vital Sign    Days of Exercise per Week: 2 days    Minutes of Exercise per Session: 30 min  Stress: No Stress Concern Present (10/16/2023)   [employer] of Occupational Health - Occupational Stress Questionnaire    Feeling of Stress : Only a little  Social Connections: Moderately Integrated (10/16/2023)   Social Connection and Isolation Panel [NHANES]    Frequency of Communication with Friends and Family: More than three times a week    Frequency of Social Gatherings with Friends and Family: More than three times a week    Attends Religious Services: More than 4 times per year    Active Member of [employer] or Organizations: Yes    Attends Banker Meetings: More than 4 times per year    Marital Status: Widowed    Tobacco Counseling Counseling given: Not Answered   Clinical Intake:  Pre-visit preparation completed: No  Pain : No/denies pain     BMI - recorded: 32.3 Nutritional Status: BMI > 30  Obese Nutritional Risks: None Diabetes: No  How often do you need to have someone help you when you read instructions, pamphlets, or other written materials from your doctor or pharmacy?: 1 - Never What is the last grade level you completed in school?: 12  Interpreter Needed?: No      Activities of Daily Living    10/16/2023    2:37 PM  In your present state of health,  do you have any difficulty performing the following activities:  Hearing? 0  Comment unable to test, difficulty in crowds.  Vision? 1  Comment had cataract surgery, wears sunglasses when outside  Difficulty concentrating or making decisions? 1  Comment gets confused sometimes, lives independent living  Walking or climbing stairs? 0  Comment has vertigo, walks slow  Dressing or bathing? 0  Doing errands, shopping? 1  Comment son takes her, history of seizures  Quarry manager and eating ? N  Using the Toilet? N  In the past six months, have you accidently leaked urine? N  Do you have problems with loss of bowel control? N  Managing your Medications? N  Managing your Finances? N  Housekeeping or managing your Housekeeping? N    Patient Care Team: Charlton Amor, DO as PCP - General (Family Medicine) Benita Gutter, MD Neurology    Indicate any recent Medical Services you may have received from other than [facility] providers in the past year (date may be approximate).     Assessment:   This is a routine wellness examination for Short.  Hearing/Vision screen Hearing Screening - Comments:: Unable to test, grossly intact. Vision Screening - Comments:: Unable to test, had cataract surgery, continues to have blurry vision.    Goals Addressed             This Visit's Progress    Exercise 3x per week (30 min per time)       Continue walking daily on the sidewalk when weather permits.       Depression Screen    10/16/2023    2:48 PM 04/08/2023    9:16 AM 04/08/2023    9:15 AM 01/08/2023   10:33 AM 07/09/2022   10:45 AM  PHQ 2/9 Scores  PHQ - 2 Score 0 0 0 0 0  PHQ- 9 Score  4  5    Fall Risk    10/16/2023    2:50 PM 05/06/2023    9:33 AM 04/08/2023    9:15 AM 01/08/2023   10:33 AM 07/09/2022   10:45 AM  Fall Risk   Falls in the past year? 0 0 0 1 0  Number falls in past yr: 0 0 0 0 0  Injury with Fall? 0 0 0 1 0  Risk for fall due to : No Fall Risks No Fall Risks No  Fall Risks History of fall(s) No Fall Risks  Follow up Falls evaluation completed Falls evaluation completed Falls evaluation completed Falls evaluation completed Falls evaluation completed    MEDICARE RISK AT HOME: Medicare Risk at Home Any stairs in or around the home?: No If so, are there any without handrails?: No Home free of loose throw rugs in walkways, pet beds, electrical cords, etc?: Yes Adequate lighting in your home to reduce risk of falls?: Yes Life alert?: No Use of a cane, walker or w/c?: No Grab bars in the bathroom?: Yes Shower chair or bench in shower?: Yes Elevated toilet seat or a handicapped toilet?: Yes  TIMED UP AND GO:  Was the test performed?  No    Cognitive Function:    02/05/2018   10:47 AM  MMSE - Mini Mental State Exam  Orientation to time 5  Orientation to Place 5  Registration 3  Attention/ Calculation 3  Recall 2  Language- name 2 objects 2  Language- repeat 1  Language- follow 3 step command 2  Language- read & follow direction 1  Write a sentence 1  Copy design 1  Total score 26        10/16/2023    2:51 PM  6CIT Screen  What Year? 0 points  What month? 0 points  What time? 0 points  Count back from 20 0 points  Months in reverse 0 points  Repeat phrase 6 points  Total Score 6 points    Immunizations Immunization History  Administered Date(s) Administered   Fluad Quad(high Dose 65+) 05/26/2019, 07/09/2022   Fluad Trivalent(High Dose 65+) 06/11/2023   PNEUMOCOCCAL CONJUGATE-20 07/09/2022    TDAP status: Due, Education has been provided regarding the importance of this vaccine. Advised may receive this vaccine at local pharmacy or Health Dept. Aware to provide a copy of the vaccination record if obtained from local pharmacy or Health Dept. Verbalized acceptance and understanding.  Flu Vaccine status: Up to date  Pneumococcal vaccine status: Up to date  Covid-19 vaccine status: Declined, Education has been provided  regarding the importance of this vaccine but patient still declined. Advised may receive this vaccine at local pharmacy or Health Dept.or vaccine clinic. Aware to provide a copy of the vaccination record if obtained from local pharmacy or Health Dept. Verbalized acceptance and understanding.  Qualifies for Shingles Vaccine? Yes   Zostavax completed No   Shingrix Completed?: No.    Education has been provided regarding the importance of this vaccine. Patient has been advised to call insurance company to determine out of pocket expense if they have not yet received this vaccine. Advised may also receive vaccine at local pharmacy or Health Dept. Verbalized acceptance and understanding.  Screening Tests Health Maintenance  Topic Date Due   COVID-19 Vaccine (1) Never done   Hepatitis C Screening  Never done   DTaP/Tdap/Td (1 - Tdap) Never done   Zoster Vaccines- Shingrix (1 of 2) Never done   [employer] Annual Wellness (AWV)  10/15/2024   Colonoscopy  09/28/2025   Pneumonia Vaccine 61+ Years old  Completed   INFLUENZA VACCINE  Completed   DEXA SCAN  Completed   HPV VACCINES  Aged Out    Health Maintenance  Health Maintenance Due  Topic Date Due   COVID-19 Vaccine (1) Never done   Hepatitis C Screening  Never done   DTaP/Tdap/Td (1 - Tdap) Never done   Zoster Vaccines- Shingrix (1 of 2) Never done    Colorectal cancer screening: No longer required.  Last colonoscopy 09/29/2015.   Mammogram status: No longer required due to age and preference.  Bone density: 06/07/2021, osteopenia.   Lung Cancer Screening: (Low Dose CT Chest recommended if Age 69-80 years, 20 pack-year currently smoking OR have quit w/in 15years.) does not qualify.   Lung Cancer Screening Referral: n/a   Additional Screening:  Hepatitis C Screening: does not qualify; Completed n/a  Vision Screening: Recommended annual ophthalmology exams for early detection of glaucoma and other disorders of the eye. Is the patient  up to date with their annual eye exam?  Yes  Who is the provider or what is the name of the office in which the patient attends annual eye exams? My eye Doctor, Dr. Tiburcio Pea  If pt is not established with a provider, would they like to be referred to a provider to establish care? No .   Dental Screening: Recommended annual dental exams for proper oral hygiene  Diabetic Foot Exam: n/a   Community Resource Referral / Chronic Care Management: CRR required this visit?  No   CCM required this visit?  No     Plan:     I have personally reviewed and noted the following in the patient's chart:   Medical and social history Use of alcohol, tobacco or illicit drugs  Current medications and supplements including opioid prescriptions. Patient is not currently taking opioid prescriptions. Functional ability and status Nutritional status Physical activity Advanced directives List of other physicians Hospitalizations, surgeries, and ER visits in previous 12 months: Cataract surgery in spring of 2024.  Vitals Screenings to include cognitive, depression, and falls Referrals and appointments: bone density   In addition, I have reviewed and discussed with patient certain preventive protocols, quality metrics, and best practice recommendations. A written personalized care plan for preventive services as well as general preventive health recommendations were provided to patient.     Novella Olive, FNP   10/16/2023   After Visit Summary: (MyChart) Due to this being a telephonic visit, the after visit summary with patients personalized plan was offered to patient via MyChart   Follow-up with PCP as scheduled. Discuss with PCP about recent episode of feeling confused.  Schedule eye appointment since cataract surgery. Declines need for vaccines.  Referral placed for bones density.

## 2023-10-18 ENCOUNTER — Other Ambulatory Visit: Payer: Self-pay | Admitting: Family Medicine

## 2023-10-18 DIAGNOSIS — R42 Dizziness and giddiness: Secondary | ICD-10-CM

## 2023-10-22 ENCOUNTER — Other Ambulatory Visit: Payer: Self-pay | Admitting: Family Medicine

## 2023-10-22 ENCOUNTER — Ambulatory Visit: Payer: Medicare Other

## 2023-10-22 DIAGNOSIS — R42 Dizziness and giddiness: Secondary | ICD-10-CM

## 2023-10-22 NOTE — Therapy (Addendum)
 OUTPATIENT PHYSICAL THERAPY VESTIBULAR TREATMENT AND DISCHARGE   Patient Name: Darlene Moore MRN: [id number] DOB:[birth year], 78 y.o., female Today's Date: 10/22/2023  END OF SESSION:  PT End of Session - 10/22/23 1050     Visit Number 2    Number of Visits 8    Date for PT Re-Evaluation 12/05/23    Authorization Type BCBS Medicare    PT Start Time 1050    PT Stop Time 1135    PT Time Calculation (min) 45 min    Activity Tolerance Patient tolerated treatment well    Behavior During Therapy WFL for tasks assessed/performed            Past Medical History:  Diagnosis Date   Concussion    Family history of breast cancer    Family history of kidney cancer    Family history of ovarian cancer    Family history of stomach cancer    Family history of uterine cancer    High cholesterol    Medical history non-contributory    Seizures (HCC)    last seizure 03/18/20   Past Surgical History:  Procedure Laterality Date   CHOLECYSTECTOMY     COLONOSCOPY     COLONOSCOPY N/A 09/29/2015   Procedure: COLONOSCOPY;  Surgeon: Malissa Hippo, MD;  Location: [facility];  Service: Endoscopy;  Laterality: N/A;  730 - moved to 9:00 - Ann to notify pt   PARTIAL MASTECTOMY WITH NEEDLE LOCALIZATION AND AXILLARY SENTINEL LYMPH NODE BX Left 10/01/2018   Procedure: LEFT BREAST PARTIAL MASTECTOMY WITH SENTINEL LYMPH NODE BIOPSY AFTER NEEDLE LOCALIZATION AND RADIOTRACER PLACEMENT;  Surgeon: Lucretia Roers, MD;  Location: [facility];  Service: General;  Laterality: Left;   Patient Active Problem List   Diagnosis Date Noted   Post-menopausal 10/16/2023   Dizziness 10/10/2023   Acute pain of right knee 06/11/2023   Balance problem 06/11/2023   Hypotension 06/11/2023   Allergic rhinitis 05/06/2023   Cerumen impaction 05/06/2023   Flu vaccine need 05/06/2023   Seizures (HCC) 04/08/2023   Primary insomnia 04/08/2023   Prediabetes 01/08/2023   Idiopathic peripheral neuropathy 01/08/2023   Combined  forms of age-related cataract of left eye 08/28/2022   Osteoporosis 06/12/2021   Age-related osteoporosis with current pathological fracture 06/12/2021   Encounter for Medicare annual wellness exam 11/20/2018   Family history of breast cancer    Family history of ovarian cancer    Family history of uterine cancer    Family history of stomach cancer    Family history of kidney cancer    Therapeutic drug monitoring 09/22/2018   Breast cancer of upper-inner quadrant of left female breast (HCC) 09/10/2018   Temporal lobe epilepsy (HCC) 07/28/2018   Closed nondisplaced fracture of sixth cervical vertebra with routine healing 09/18/2017   Dysfunction of eustachian tube 12/15/2013    PCP: Tamera Punt REFERRING PROVIDER: Tamera Punt  REFERRING DIAG: dizziness  THERAPY DIAG:  Dizziness and giddiness  ONSET DATE: December 2024  Rationale for Evaluation and Treatment: Rehabilitation  SUBJECTIVE:   SUBJECTIVE STATEMENT: Patient reports her eyes are irritated and itchy due to allergies; states no change in vestibular symptoms since eval. Patient states her dizziness baseline is 6-7/10.  EVAL: Pt states that she has been dizzy for about "2 years". She states that in the last month it has been worse. She is most "wobbly" in the mornings but she still has dizziness throughout the day. The dizziness is constant, does not get better. Dizziness does get worse with  laying supine or laying back in the recliner. Pt reports some difficulty focusing eyes especially when reading. She does have history of cataract surgery. Pt accompanied by: family member  PERTINENT HISTORY: epilepsy  PAIN:  Are you having pain? No  PRECAUTIONS: Fall  RED FLAGS: None   WEIGHT BEARING RESTRICTIONS: No  FALLS: Has patient fallen in last 6 months? Yes. Number of falls unknown  LIVING ENVIRONMENT: Lives with: lives alone Lives in: Other independent living facility Stairs: No Has following equipment at home: None  PLOF:  Independent  PATIENT GOALS: decrease dizziness  OBJECTIVE:  Note: Objective measures were completed at Evaluation unless otherwise noted.  DIAGNOSTIC FINDINGS: head CT 2022: No acute or traumatic finding. Cerebellar more than cerebral atrophy. No focal insult visible by CT.  COGNITION: Overall cognitive status: Within functional limits for tasks assessed    GAIT:  Comments: wide BOS, forward flexed trunk, decreased cadence   VESTIBULAR ASSESSMENT:    SYMPTOM BEHAVIOR:  Subjective history: see above  Non-Vestibular symptoms: headaches  Type of dizziness: Spinning/Vertigo  Frequency: daily  Duration: seconds  Aggravating factors: Induced by position change: lying supine and Worse in the morning  Relieving factors: no known relieving factors  Progression of symptoms: unchanged  OCULOMOTOR EXAM:  Ocular Alignment: normal  Ocular ROM: No Limitations  Spontaneous Nystagmus: absent  Gaze-Induced Nystagmus: absent  Smooth Pursuits: intact - mild increase in symptoms  Saccades: hypometric/undershoots     VESTIBULAR - OCULAR REFLEX:   Slow VOR: Comment: positive with head nods  VOR Cancellation: Unable to Maintain Gaze  Head-Impulse Test: HIT Right: negative HIT Left: positive     POSITIONAL TESTING: Right Dix-Hallpike: no nystagmus Left Dix-Hallpike: no nystagmus Right Roll Test: no nystagmus Left Roll Test: no nystagmus  MOTION SENSITIVITY:  Motion Sensitivity Quotient Intensity: 0 = none, 1 = Lightheaded, 2 = Mild, 3 = Moderate, 4 = Severe, 5 = Vomiting  Intensity  1. Sitting to supine 3  2. Supine to L side   3. Supine to R side   4. Supine to sitting 3  5. L Hallpike-Dix   6. Up from L    7. R Hallpike-Dix   8. Up from R    9. Sitting, head tipped to L knee   10. Head up from L knee   11. Sitting, head tipped to R knee   12. Head up from R knee   13. Sitting head turns x5   14.Sitting head nods x5   15. In stance, 180 turn to L    16. In stance,  180 turn to R                                                                                                                                [facility] Adult PT Treatment:  DATE: 10/22/2023 Neuromuscular re-ed: Habituation: Sit to supine Supine to side lying Side lying to sitting R side lying --> supine --> L side lying <--> supine Seated VORx1 with head turns, slow pace 3x30" Seated smooth pursuits --> 3 rounds to tolerance Therapeutic Activity: DGI (results in Assessment)   TREATMENT DATE: 10/10/23 Gaze Adaptation:  x1 Viewing Vertical:  Reps: 10 Habituation:   Other: supine <> sit x 5 with HOB elevated  PATIENT EDUCATION: Education details: PT POC and goals, HEP Person educated: Patient Education method: Explanation, Demonstration, and Handouts Education comprehension: verbalized understanding and returned demonstration  HOME EXERCISE PROGRAM: Access Code: [email or web address] URL: [email or web address] Date: 10/10/2023 Prepared by: Reggy Eye  Exercises - Seated Gaze Stabilization with Head Nod  - 3 x daily - 7 x weekly - 3 sets - 10 reps - Long Sitting to Supine Vestibular Habituation  - 1 x daily - 7 x weekly - 3 sets - 10 reps  GOALS: Goals reviewed with patient? Yes  SHORT TERM GOALS: Target date: 11/07/2023  Pt will be independent in initial HEP Baseline: Goal status: INITIAL    LONG TERM GOALS: Target date: 12/05/2023  Pt will be independent with advanced HEP Baseline:  Goal status: INITIAL  2.  Pt will tolerate supine to sit with dizziness <= 1/5 Baseline: 3/5 Goal status: INITIAL  3.  Pt will report dizziness <= 2/7 days per week Baseline: 7/7 days Goal status: INITIAL  4.  Pt will improve DGI to >= 19/24 to demo decreased risk of falls Baseline: 13/24 Goal status: INITIAL     ASSESSMENT:  CLINICAL IMPRESSION: Habituation exercises continued, focusing on side lying to supine transitions  due to greater dizziness to the L side. Symptoms decreased with repetition. Patient scored 13/24 on DGI, placing her in range predictive of falls risk. Smooth pursuits added with head turns, completing three rounds of exercise to patient's tolerance due to irritated eyes from allergies.   EVAL: Patient is a 78 y.o. female who was seen today for physical therapy evaluation and treatment for dizziness. Pt presents with decreased VOR, vestibular hypofunction and decreased tolerance to position changes as well as decreased mobility and impaired gait. Pt will benefit from skilled PT to address deficits, reduce risk of falls and improve activity tolerance.   OBJECTIVE IMPAIRMENTS: decreased activity tolerance, decreased balance, and dizziness.   ACTIVITY LIMITATIONS: transfers, bed mobility, and locomotion level  PARTICIPATION LIMITATIONS: cleaning, laundry, and community activity  PERSONAL FACTORS: Past/current experiences, Time since onset of injury/illness/exacerbation, and 1-2 comorbidities: visual impairments, history of epilepsy  are also affecting patient's functional outcome.   REHAB POTENTIAL: Good  CLINICAL DECISION MAKING: Evolving/moderate complexity  EVALUATION COMPLEXITY: Moderate   PLAN:  PT FREQUENCY: 1x/week  PT DURATION: 8 weeks  PLANNED INTERVENTIONS: 97164- PT Re-evaluation, 97110-Therapeutic exercises, 97530- Therapeutic activity, 97112- Neuromuscular re-education, 97535- Self Care, 54098- Manual therapy, [phone]- Canalith repositioning, Patient/Family education, Balance training, and Vestibular training  PLAN FOR NEXT SESSION: Continue habituation, VOR, smooth pursuits.    Reggy Eye, PT,DPT01/28/251:40 PM  Sanjuana Mae, PTA 10/22/2023, 11:38 AM   PHYSICAL THERAPY DISCHARGE SUMMARY  Visits from Start of Care: 2  Current functional level related to goals / functional outcomes: decreased symptoms   Remaining deficits: See above   Education /  Equipment: HEP   Patient agrees to discharge. Patient goals were not met. Patient is being discharged due to not returning since the last visit.  Reggy Eye, PT,DPT03/07/259:10 AM

## 2023-10-22 NOTE — Telephone Encounter (Signed)
Patient's son advised. Patient in rehab at the moment. She was taking the medication daily. He states she doesn't have an ENT provider.

## 2023-10-23 NOTE — Telephone Encounter (Signed)
Patients son advised

## 2023-10-29 ENCOUNTER — Ambulatory Visit: Payer: Medicare Other | Admitting: Family Medicine

## 2023-11-04 ENCOUNTER — Encounter: Payer: Self-pay | Admitting: Family Medicine

## 2023-11-04 ENCOUNTER — Ambulatory Visit (INDEPENDENT_AMBULATORY_CARE_PROVIDER_SITE_OTHER): Payer: Medicare Other | Admitting: Family Medicine

## 2023-11-04 VITALS — BP 117/61 | HR 61 | Ht 66.0 in | Wt 213.8 lb

## 2023-11-04 DIAGNOSIS — F5101 Primary insomnia: Secondary | ICD-10-CM

## 2023-11-04 DIAGNOSIS — R42 Dizziness and giddiness: Secondary | ICD-10-CM

## 2023-11-04 MED ORDER — TRAZODONE HCL 50 MG PO TABS: 50.0000 mg | ORAL_TABLET | Freq: Every day | ORAL | 3 refills | Status: DC

## 2023-11-04 MED ORDER — MECLIZINE HCL 25 MG PO TABS: 25.0000 mg | ORAL_TABLET | Freq: Two times a day (BID) | ORAL | 0 refills | Status: DC | PRN

## 2023-11-04 NOTE — Progress Notes (Signed)
 Established patient visit   Patient: Darlene Moore   DOB: [birth year]   78 y.o. Female  MRN: [id number] Visit Date: 11/04/2023  Today's healthcare provider: Josepha Nickels, DO   Chief Complaint  Patient presents with   Medical Management of Chronic Issues    Vertigo     SUBJECTIVE    Chief Complaint  Patient presents with   Medical Management of Chronic Issues    Vertigo    HPI HPI     Medical Management of Chronic Issues    Additional comments: Vertigo       Last edited by Denece Finger, CMA on 11/04/2023 10:00 AM.       Pt presents for follow up on dizziness. At last visit she was given meclizine  and referred to ENT and vestibular PT. She continues to do well with vestibular PT and they have made short and long term treatment goals. Treatment has included canalith repositioning, neuromuscular education, balance training, etc. She continues to do home exercises as well. She notes it has been hard for her to get out of the house and is wanting to do therapy at home.   Also notes increased insomnia. Says she has had some trazodone  left over and was using that which seems to help and no longer makes her groggy.     Review of Systems  Constitutional:  Negative for activity change, fatigue and fever.  Respiratory:  Negative for cough and shortness of breath.   Cardiovascular:  Negative for chest pain.  Gastrointestinal:  Negative for abdominal pain.  Genitourinary:  Negative for difficulty urinating.       Current Meds  Medication Sig   anastrozole  (ARIMIDEX ) 1 MG tablet TAKE 1 TABLET BY MOUTH DAILY   atorvastatin  (LIPITOR) 10 MG tablet Take 1 tablet (10 mg total) by mouth daily.   Calcium  Carbonate (CALCIUM  600 PO) Take by mouth 2 (two) times daily.   gabapentin  (NEURONTIN ) 300 MG capsule TAKE ONE CAPSULE BY MOUTH TWICE DAILY AS NEEDED FOR NERVE PAIN GENERIC EQUIVALENT FOR NEURONTIN    Lacosamide  150 MG TABS Take 1 tablet (150 mg total) by mouth in the morning  and at bedtime.   lamoTRIgine  (LAMICTAL ) 25 MG tablet Take 2 tablets (50 mg total) by mouth 2 (two) times daily.   levETIRAcetam  (KEPPRA  XR) 500 MG 24 hr tablet Take 3 tablets (1,500 mg total) by mouth in the morning and at bedtime.   meclizine  (ANTIVERT ) 25 MG tablet Take 1 tablet (25 mg total) by mouth 2 (two) times daily as needed for dizziness.   Multiple Vitamin (MULTIVITAMIN) tablet Take 1 tablet by mouth daily.   Probiotic, Lactobacillus, CAPS Take by mouth.   ramelteon  (ROZEREM ) 8 MG tablet Take 1 tablet (8 mg total) by mouth at bedtime.   sertraline  (ZOLOFT ) 25 MG tablet Take 1 tablet by mouth daily.   traZODone  (DESYREL ) 50 MG tablet Take 1 tablet (50 mg total) by mouth at bedtime.   VITAMIN D  PO Take 5,000 Units by mouth.   Wheat Dextrin (BENEFIBER PO) Take by mouth.    OBJECTIVE    BP 117/61 (BP Location: Left Arm, Patient Position: Sitting, Cuff Size: Large)   Pulse 61   Ht 5\' 6"  (1.676 m)   Wt 213 lb 12 oz (97 kg)   SpO2 100%   BMI 34.50 kg/m   Physical Exam Vitals and nursing note reviewed.  Constitutional:      General: She is not in acute distress.  Appearance: Normal appearance.  HENT:     Head: Normocephalic and atraumatic.     Right Ear: External ear normal.     Left Ear: External ear normal.     Nose: Nose normal.  Eyes:     Conjunctiva/sclera: Conjunctivae normal.  Cardiovascular:     Rate and Rhythm: Normal rate and regular rhythm.  Pulmonary:     Effort: Pulmonary effort is normal.     Breath sounds: Normal breath sounds.  Neurological:     General: No focal deficit present.     Mental Status: She is alert and oriented to person, place, and time.  Psychiatric:        Mood and Affect: Mood normal.        Behavior: Behavior normal.        Thought Content: Thought content normal.        Judgment: Judgment normal.        ASSESSMENT & PLAN    Problem List Items Addressed This Visit       Other   Primary insomnia - Primary   Pt notes that  the trazodone  50mg  has been helping her sleep and not making her groggy like previously. Have gone ahead and sent in a refill of trazodone . - follow up 6 months      Dizziness   Have gone ahead and sent in refills on meclizine   - discussed doing her home exercises which she says she has been doing. She has not yet been contacted by ENT and I am reaching out to referral team now to determine status of referral.       Relevant Medications   meclizine  (ANTIVERT ) 25 MG tablet    Return in about 6 months (around 05/03/2024).      Meds ordered this encounter  Medications   meclizine  (ANTIVERT ) 25 MG tablet    Sig: Take 1 tablet (25 mg total) by mouth 2 (two) times daily as needed for dizziness.    Dispense:  60 tablet    Refill:  0   traZODone  (DESYREL ) 50 MG tablet    Sig: Take 1 tablet (50 mg total) by mouth at bedtime.    Dispense:  30 tablet    Refill:  3    No orders of the defined types were placed in this encounter.    Josepha Nickels, DO  [facility] at [facility] [phone] (phone) [phone] (fax)  [facility]

## 2023-11-04 NOTE — Assessment & Plan Note (Signed)
 Pt notes that the trazodone  50mg  has been helping her sleep and not making her groggy like previously. Have gone ahead and sent in a refill of trazodone . - follow up 6 months

## 2023-11-04 NOTE — Assessment & Plan Note (Signed)
 Have gone ahead and sent in refills on meclizine   - discussed doing her home exercises which she says she has been doing. She has not yet been contacted by ENT and I am reaching out to referral team now to determine status of referral.

## 2023-11-05 ENCOUNTER — Ambulatory Visit: Payer: Medicare Other

## 2023-11-28 ENCOUNTER — Other Ambulatory Visit: Payer: Self-pay | Admitting: Family Medicine

## 2023-11-28 DIAGNOSIS — Z1231 Encounter for screening mammogram for malignant neoplasm of breast: Secondary | ICD-10-CM

## 2023-12-09 ENCOUNTER — Other Ambulatory Visit: Payer: Self-pay

## 2023-12-09 DIAGNOSIS — G40109 Localization-related (focal) (partial) symptomatic epilepsy and epileptic syndromes with simple partial seizures, not intractable, without status epilepticus: Secondary | ICD-10-CM

## 2023-12-09 MED ORDER — LEVETIRACETAM ER 500 MG PO TB24: 1500.0000 mg | ORAL_TABLET | Freq: Two times a day (BID) | ORAL | 0 refills | Status: AC

## 2023-12-12 ENCOUNTER — Ambulatory Visit

## 2023-12-18 DIAGNOSIS — G629 Polyneuropathy, unspecified: Secondary | ICD-10-CM | POA: Diagnosis not present

## 2023-12-18 DIAGNOSIS — G40109 Localization-related (focal) (partial) symptomatic epilepsy and epileptic syndromes with simple partial seizures, not intractable, without status epilepticus: Secondary | ICD-10-CM | POA: Diagnosis not present

## 2023-12-18 DIAGNOSIS — C50212 Malignant neoplasm of upper-inner quadrant of left female breast: Secondary | ICD-10-CM | POA: Diagnosis not present

## 2023-12-18 DIAGNOSIS — Z1330 Encounter for screening examination for mental health and behavioral disorders, unspecified: Secondary | ICD-10-CM | POA: Diagnosis not present

## 2024-01-09 ENCOUNTER — Encounter: Payer: Self-pay | Admitting: Medical Oncology

## 2024-01-09 ENCOUNTER — Inpatient Hospital Stay: Payer: Medicare Other | Admitting: Medical Oncology

## 2024-01-09 ENCOUNTER — Inpatient Hospital Stay: Payer: Medicare Other | Attending: Hematology & Oncology

## 2024-01-09 VITALS — BP 112/50 | HR 50 | Temp 97.6°F | Resp 18 | Ht 66.0 in | Wt 201.1 lb

## 2024-01-09 DIAGNOSIS — Z853 Personal history of malignant neoplasm of breast: Secondary | ICD-10-CM | POA: Insufficient documentation

## 2024-01-09 DIAGNOSIS — Z170 Estrogen receptor positive status [ER+]: Secondary | ICD-10-CM

## 2024-01-09 DIAGNOSIS — Z08 Encounter for follow-up examination after completed treatment for malignant neoplasm: Secondary | ICD-10-CM | POA: Insufficient documentation

## 2024-01-09 DIAGNOSIS — C50212 Malignant neoplasm of upper-inner quadrant of left female breast: Secondary | ICD-10-CM

## 2024-01-09 LAB — CBC WITH DIFFERENTIAL (CANCER CENTER ONLY)
Abs Immature Granulocytes: 0.02 10*3/uL (ref 0.00–0.07)
Basophils Absolute: 0 10*3/uL (ref 0.0–0.1)
Basophils Relative: 1 %
Eosinophils Absolute: 0.1 10*3/uL (ref 0.0–0.5)
Eosinophils Relative: 1 %
HCT: 43.3 % (ref 36.0–46.0)
Hemoglobin: 14.3 g/dL (ref 12.0–15.0)
Immature Granulocytes: 0 %
Lymphocytes Relative: 21 %
Lymphs Abs: 1.3 10*3/uL (ref 0.7–4.0)
MCH: 31 pg (ref 26.0–34.0)
MCHC: 33 g/dL (ref 30.0–36.0)
MCV: 93.9 fL (ref 80.0–100.0)
Monocytes Absolute: 0.5 10*3/uL (ref 0.1–1.0)
Monocytes Relative: 8 %
Neutro Abs: 4.3 10*3/uL (ref 1.7–7.7)
Neutrophils Relative %: 69 %
Platelet Count: 158 10*3/uL (ref 150–400)
RBC: 4.61 MIL/uL (ref 3.87–5.11)
RDW: 12.6 % (ref 11.5–15.5)
WBC Count: 6.1 10*3/uL (ref 4.0–10.5)
nRBC: 0 % (ref 0.0–0.2)

## 2024-01-09 LAB — CMP (CANCER CENTER ONLY)
ALT: 16 U/L (ref 0–44)
AST: 20 U/L (ref 15–41)
Albumin: 4.7 g/dL (ref 3.5–5.0)
Alkaline Phosphatase: 77 U/L (ref 38–126)
Anion gap: 10 (ref 5–15)
BUN: 19 mg/dL (ref 8–23)
CO2: 28 mmol/L (ref 22–32)
Calcium: 10.6 mg/dL — ABNORMAL HIGH (ref 8.9–10.3)
Chloride: 101 mmol/L (ref 98–111)
Creatinine: 0.79 mg/dL (ref 0.44–1.00)
GFR, Estimated: >60 mL/min (ref >60)
Glucose, Bld: 105 mg/dL — ABNORMAL HIGH (ref 70–99)
Potassium: 4.5 mmol/L (ref 3.5–5.1)
Sodium: 139 mmol/L (ref 135–145)
Total Bilirubin: 0.6 mg/dL (ref 0.0–1.2)
Total Protein: 7.3 g/dL (ref 6.5–8.1)

## 2024-01-09 LAB — LACTATE DEHYDROGENASE: LDH: 188 U/L (ref 98–192)

## 2024-01-09 NOTE — Progress Notes (Signed)
 Hematology and Oncology Follow Up Visit  Darlene Moore [id number] 10-29-1945 78 y.o. 01/09/2024  Past Medical History:  Diagnosis Date   Concussion    Family history of breast cancer    Family history of kidney cancer    Family history of ovarian cancer    Family history of stomach cancer    Family history of uterine cancer    High cholesterol    Medical history non-contributory    Seizures (HCC)    last seizure 03/18/20    Principle Diagnosis:  Stage 1a (T1cN0M0) invasive ductal carcinoma of the LEFT breast -- ER+/PR+/HER2- 09/10/2018  Negative genetic testing on the multi-cancer panel.  The Multi-Gene Panel offered by [facility] includes sequencing and/or deletion duplication testing of the following 84 genes: AIP, ALK, APC, ATM, AXIN2,BAP1,  BARD1, BLM, BMPR1A, BRCA1, BRCA2, BRIP1, CASR, CDC73, CDH1, CDK4, CDKN1B, CDKN1C, CDKN2A (p14ARF), CDKN2A (p16INK4a), CEBPA, CHEK2, CTNNA1, DICER1, DIS3L2, EGFR (c.2369C>T, p.Thr790Met variant only), EPCAM (Deletion/duplication testing only), FH, FLCN, GATA2, GPC3, GREM1 (Promoter region deletion/duplication testing only), HOXB13 (c.251G>A, p.Gly84Glu), HRAS, KIT, MAX, MEN1, MET, MITF (c.952G>A, p.Glu318Lys variant only), MLH1, MSH2, MSH3, MSH6, MUTYH, NBN, NF1, NF2, NTHL1, PALB2, PDGFRA, PHOX2B, PMS2, POLD1, POLE, POT1, PRKAR1A, PTCH1, PTEN, RAD50, RAD51C, RAD51D, RB1, RECQL4, RET, RUNX1, SDHAF2, SDHA (sequence changes only), SDHB, SDHC, SDHD, SMAD4, SMARCA4, SMARCB1, SMARCE1, STK11, SUFU, TERC, TERT, TMEM127, TP53, TSC1, TSC2, VHL, WRN and WT1.  The report date is November 14, 2018   Current Therapy:   Surveillance   PriorTherapy:   Lumpectomy XRT Anastrozole x 5 years- 10/28/2018-10/2023    Interim History:  Darlene Moore is back for follow-up for her 6 month follow up for her history of stage 1a breast cancer.   Today she states that she has been well. She has no concerns or complains at this time.   Her last mammogram was in January 2024  which was non-concerning. She has her next scheduled for 01/15/2024. She denies any breast changes, night sweats, unintentional weight loss.  Last CA 27.29 was on 07/09/2023 with a value of 34.2  She does have osteoporosis which is managed by her PCP. Last DEXA was on 06/07/2021. Her next DEXA is scheduled for next week. She stopped taking her calcium and vitamin D about 2 weeks ago but then restarted recently.     Wt Readings from Last 3 Encounters:  01/09/24 201 lb 1.3 oz (91.2 kg)  11/04/23 213 lb 12 oz (97 kg)  10/16/23 200 lb (90.7 kg)     Medications:   Current Outpatient Medications:    anastrozole (ARIMIDEX) 1 MG tablet, TAKE 1 TABLET BY MOUTH DAILY, Disp: 90 tablet, Rfl: 3   atorvastatin (LIPITOR) 10 MG tablet, Take 1 tablet (10 mg total) by mouth daily., Disp: 90 tablet, Rfl: 3   Calcium Carbonate (CALCIUM 600 PO), Take by mouth 2 (two) times daily., Disp: , Rfl:    gabapentin (NEURONTIN) 300 MG capsule, TAKE ONE CAPSULE BY MOUTH TWICE DAILY AS NEEDED FOR NERVE PAIN GENERIC EQUIVALENT FOR NEURONTIN, Disp: 90 capsule, Rfl: 3   Lacosamide 150 MG TABS, Take 1 tablet (150 mg total) by mouth in the morning and at bedtime., Disp: 180 tablet, Rfl: 1   lamoTRIgine (LAMICTAL) 25 MG tablet, Take 2 tablets (50 mg total) by mouth 2 (two) times daily., Disp: 360 tablet, Rfl: 3   levETIRAcetam (KEPPRA XR) 500 MG 24 hr tablet, Take 3 tablets (1,500 mg total) by mouth in the morning and at bedtime. WILL NEED APPOINTMENT FOR  FURTHER REFILLS, Disp: 180 tablet, Rfl: 0   meclizine (ANTIVERT) 25 MG tablet, Take 1 tablet (25 mg total) by mouth 2 (two) times daily as needed for dizziness., Disp: 60 tablet, Rfl: 0   Multiple Vitamin (MULTIVITAMIN) tablet, Take 1 tablet by mouth daily., Disp: , Rfl:    Probiotic, Lactobacillus, CAPS, Take by mouth., Disp: , Rfl:    ramelteon (ROZEREM) 8 MG tablet, Take 1 tablet (8 mg total) by mouth at bedtime., Disp: 30 tablet, Rfl: 3   sertraline (ZOLOFT) 25 MG tablet,  Take 1 tablet by mouth daily., Disp: , Rfl:    traZODone (DESYREL) 50 MG tablet, Take 1 tablet (50 mg total) by mouth at bedtime., Disp: 30 tablet, Rfl: 3   VITAMIN D PO, Take 5,000 Units by mouth., Disp: , Rfl:    Wheat Dextrin (BENEFIBER PO), Take by mouth., Disp: , Rfl:   Allergies: No Known Allergies  Past Medical History, Surgical history, Social history, and Family History were reviewed and updated.  Review of Systems: Review of Systems  Constitutional:  Negative for unexpected weight change.  Respiratory:  Negative for shortness of breath.   Cardiovascular:  Negative for chest pain and leg swelling.  Gastrointestinal:  Negative for abdominal pain and blood in stool.  Genitourinary:  Negative for hematuria.   Skin:  Negative for rash.  Neurological:  Negative for headaches.     Physical Exam:  height is 5\' 6"  (1.676 m) and weight is 201 lb 1.3 oz (91.2 kg). Her oral temperature is 97.6 F (36.4 C). Her blood pressure is 112/50 (abnormal) and her pulse is 50 (abnormal). Her respiration is 18 and oxygen saturation is 100%.   Physical Exam General: NAD Cardiovascular: regular rate and rhythm Pulmonary: clear ant fields Breast: Deferred  Extremities: no edema, no joint deformities Skin: no rashes Neurological: Weakness but otherwise nonfocal   Lab Results  Component Value Date   WBC 6.1 01/09/2024   HGB 14.3 01/09/2024   HCT 43.3 01/09/2024   MCV 93.9 01/09/2024   PLT 158 01/09/2024     Chemistry      Component Value Date/Time   NA 139 01/09/2024 1128   NA 144 10/10/2023 1014   K 4.5 01/09/2024 1128   CL 101 01/09/2024 1128   CO2 28 01/09/2024 1128   BUN 19 01/09/2024 1128   BUN 17 10/10/2023 1014   CREATININE 0.79 01/09/2024 1128   CREATININE 0.80 01/08/2023 1034      Component Value Date/Time   CALCIUM 10.6 (H) 01/09/2024 1128   ALKPHOS 77 01/09/2024 1128   AST 20 01/09/2024 1128   ALT 16 01/09/2024 1128   BILITOT 0.6 01/09/2024 1128     Encounter  Diagnosis  Name Primary?   Malignant neoplasm of upper-inner quadrant of left breast in female, estrogen receptor positive (HCC) Yes    Assessment and Plan- Patient is a 78 y.o. female with a history of Stage 1a (T1cN0M0) invasive ductal carcinoma of the LEFT breast -- ER+/PR+/HER2- 2019. She has finished 5 years of her Anastrozole as of 10/2023.    No evidence or recurrence on exam, in history or on most recent mammogram.  She will have her next mammogram as scheduled.  PCP will recheck calcium in 2-3 months. If elevated I would recommend a bone survey.  We will plan to see her again in 12 months or sooner as needed.   Disposition: RTC 12 months MD, labs (CBC w/, CMP, LDH, CA 27.29)   Sunnie England PA-C  4/17/202512:33 PM

## 2024-01-10 LAB — CANCER ANTIGEN 27.29: CA 27.29: 32.8 U/mL (ref 0.0–38.6)

## 2024-01-14 ENCOUNTER — Encounter: Payer: Self-pay | Admitting: Medical Oncology

## 2024-01-15 ENCOUNTER — Other Ambulatory Visit

## 2024-01-15 ENCOUNTER — Ambulatory Visit

## 2024-01-30 ENCOUNTER — Encounter: Payer: Self-pay | Admitting: Family Medicine

## 2024-02-19 ENCOUNTER — Ambulatory Visit (INDEPENDENT_AMBULATORY_CARE_PROVIDER_SITE_OTHER)

## 2024-02-19 ENCOUNTER — Ambulatory Visit

## 2024-02-19 DIAGNOSIS — Z780 Asymptomatic menopausal state: Secondary | ICD-10-CM | POA: Diagnosis not present

## 2024-02-19 DIAGNOSIS — Z1231 Encounter for screening mammogram for malignant neoplasm of breast: Secondary | ICD-10-CM

## 2024-02-19 DIAGNOSIS — Z13820 Encounter for screening for osteoporosis: Secondary | ICD-10-CM | POA: Diagnosis not present

## 2024-02-19 DIAGNOSIS — M8589 Other specified disorders of bone density and structure, multiple sites: Secondary | ICD-10-CM | POA: Diagnosis not present

## 2024-02-23 ENCOUNTER — Ambulatory Visit: Payer: Self-pay | Admitting: Family Medicine

## 2024-02-23 DIAGNOSIS — M8580 Other specified disorders of bone density and structure, unspecified site: Secondary | ICD-10-CM | POA: Insufficient documentation

## 2024-02-23 NOTE — Progress Notes (Signed)
 Please call patient. Normal mammogram.  Repeat in 1 year.

## 2024-02-23 NOTE — Progress Notes (Signed)
 HI Stana Ear, I am covering for Dr. Dianah Fort until Laurina Popper starts in June with us .  YOur bone density shows that your T score is -1.7 which means you have osteopenia   The current recommendation for osteopenia (mildly thin bones) treatment includes:   #1 calcium -total of 1200 mg of calcium  daily.  If you eat a very calcium  rich diet you may be able to obtain that without a supplement.  If not, then I recommend calcium  500 mg twice a day.  There are several products over-the-counter such as Caltrate D and Viactiv chews which are great options that contain calcium  and vitamin D . #2 vitamin D -recommend 800 international units daily. #3 exercise-recommend 30 minutes of weightbearing exercise 3 days a week.  Resistance training ,such as doing bands and light weights, can be particularly helpful.

## 2024-02-26 DIAGNOSIS — H90A32 Mixed conductive and sensorineural hearing loss, unilateral, left ear with restricted hearing on the contralateral side: Secondary | ICD-10-CM | POA: Diagnosis not present

## 2024-02-26 DIAGNOSIS — H61303 Acquired stenosis of external ear canal, unspecified, bilateral: Secondary | ICD-10-CM | POA: Diagnosis not present

## 2024-02-26 DIAGNOSIS — H90A22 Sensorineural hearing loss, unilateral, left ear, with restricted hearing on the contralateral side: Secondary | ICD-10-CM | POA: Diagnosis not present

## 2024-02-26 DIAGNOSIS — H8112 Benign paroxysmal vertigo, left ear: Secondary | ICD-10-CM | POA: Diagnosis not present

## 2024-02-26 DIAGNOSIS — H6123 Impacted cerumen, bilateral: Secondary | ICD-10-CM | POA: Diagnosis not present

## 2024-02-26 DIAGNOSIS — H903 Sensorineural hearing loss, bilateral: Secondary | ICD-10-CM | POA: Diagnosis not present

## 2024-03-02 DIAGNOSIS — K080 Exfoliation of teeth due to systemic causes: Secondary | ICD-10-CM | POA: Diagnosis not present

## 2024-03-05 DIAGNOSIS — H8112 Benign paroxysmal vertigo, left ear: Secondary | ICD-10-CM | POA: Diagnosis not present

## 2024-03-23 DIAGNOSIS — H8111 Benign paroxysmal vertigo, right ear: Secondary | ICD-10-CM | POA: Diagnosis not present

## 2024-03-30 ENCOUNTER — Ambulatory Visit (INDEPENDENT_AMBULATORY_CARE_PROVIDER_SITE_OTHER): Payer: Medicare Other | Admitting: Urgent Care

## 2024-03-30 ENCOUNTER — Encounter: Payer: Self-pay | Admitting: Urgent Care

## 2024-03-30 VITALS — BP 128/56 | HR 55 | Resp 18 | Ht 66.0 in | Wt 203.8 lb

## 2024-03-30 DIAGNOSIS — E782 Mixed hyperlipidemia: Secondary | ICD-10-CM

## 2024-03-30 DIAGNOSIS — Z853 Personal history of malignant neoplasm of breast: Secondary | ICD-10-CM | POA: Diagnosis not present

## 2024-03-30 DIAGNOSIS — G609 Hereditary and idiopathic neuropathy, unspecified: Secondary | ICD-10-CM

## 2024-03-30 DIAGNOSIS — R7303 Prediabetes: Secondary | ICD-10-CM

## 2024-03-30 DIAGNOSIS — R569 Unspecified convulsions: Secondary | ICD-10-CM

## 2024-03-30 DIAGNOSIS — M8580 Other specified disorders of bone density and structure, unspecified site: Secondary | ICD-10-CM

## 2024-03-30 MED ORDER — ATORVASTATIN CALCIUM 10 MG PO TABS
10.0000 mg | ORAL_TABLET | Freq: Every day | ORAL | 3 refills | Status: AC
Start: 2024-03-30 — End: ?

## 2024-03-30 MED ORDER — GABAPENTIN 300 MG PO CAPS: 300.0000 mg | ORAL_CAPSULE | Freq: Every day | ORAL | 3 refills | Status: AC

## 2024-03-30 NOTE — Progress Notes (Signed)
 Established Patient Office Visit  Subjective:  Patient ID: Darlene Moore, female    DOB: 12/19/1945  Age: 78 y.o. MRN: [id number]  No chief complaint on file.   HPI  Discussed the use of AI scribe software for clinical note transcription with the patient, who gave verbal consent to proceed.  History of Present Illness   Darlene Moore is a 78 year old female who presents for a routine follow-up visit. She is accompanied by her son.  She feels great and has no particular concerns to address. Today is her birthday! She is active, walking about nine to ten thousand steps a day. She lives independently in an apartment with a common area and enjoys walking outside for exercise.  She has a history of breast cancer and completed her anastrozole  treatment in February after taking it for seven years. She is not currently on any breast cancer medication.  She experiences tingling in her feet at night and sometimes in her hands, for which she takes gabapentin  300 mg at night as needed. The tingling is not related to her breast cancer treatment. Previous investigations, including B12 levels, were normal. She feels the medication is effective.  She has a history of temporal lobe epilepsy and is currently on Keppra  (levetiracetam ) 1000 mg twice daily, lacosamide , and Lamictal  (lamotrigine ). She sees her neurologist regularly every six months, with the last visit approximately three and a half months ago.  She is taking atorvastatin  for cholesterol and calcium  carbonate for bone health. She also takes Vymex, an over-the-counter supplement for bone health, which includes calcium  and vitamin D , twice daily. She is not taking meclizine , Rozerem , Zoloft , or trazodone  anymore. She uses vitamin D  over the counter with her calcium  supplement.      Patient Active Problem List   Diagnosis Date Noted   Osteopenia 02/23/2024   Post-menopausal 10/16/2023   Dizziness 10/10/2023   Acute pain of right knee  06/11/2023   Balance problem 06/11/2023   Hypotension 06/11/2023   Allergic rhinitis 05/06/2023   Seizures (HCC) 04/08/2023   Primary insomnia 04/08/2023   Prediabetes 01/08/2023   Idiopathic peripheral neuropathy 01/08/2023   Combined forms of age-related cataract of left eye 08/28/2022   Osteoporosis 06/12/2021   Age-related osteoporosis with current pathological fracture 06/12/2021   Encounter for Medicare annual wellness exam 11/20/2018   Family history of breast cancer    Family history of ovarian cancer    Family history of uterine cancer    Family history of stomach cancer    Family history of kidney cancer    Therapeutic drug monitoring 09/22/2018   Breast cancer of upper-inner quadrant of left female breast (HCC) 09/10/2018   Temporal lobe epilepsy (HCC) 07/28/2018   Closed nondisplaced fracture of sixth cervical vertebra with routine healing 09/18/2017   Dysfunction of eustachian tube 12/15/2013   Past Medical History:  Diagnosis Date   Concussion    Family history of breast cancer    Family history of kidney cancer    Family history of ovarian cancer    Family history of stomach cancer    Family history of uterine cancer    High cholesterol    Medical history non-contributory    Seizures (HCC)    last seizure 03/18/20   Past Surgical History:  Procedure Laterality Date   CHOLECYSTECTOMY     COLONOSCOPY     COLONOSCOPY N/A 09/29/2015   Procedure: COLONOSCOPY;  Surgeon: Claudis RAYMOND Rivet, MD;  Location: [facility];  Service: Endoscopy;  Laterality: N/A;  730 - moved to 9:00 - Ann to notify pt   PARTIAL MASTECTOMY WITH NEEDLE LOCALIZATION AND AXILLARY SENTINEL LYMPH NODE BX Left 10/01/2018   Procedure: LEFT BREAST PARTIAL MASTECTOMY WITH SENTINEL LYMPH NODE BIOPSY AFTER NEEDLE LOCALIZATION AND RADIOTRACER PLACEMENT;  Surgeon: Kallie Manuelita BROCKS, MD;  Location: [facility];  Service: General;  Laterality: Left;   Social History   Tobacco Use   Smoking status: Never    Smokeless tobacco: Never  Vaping Use   Vaping status: Never Used  Substance Use Topics   Alcohol use: No   Drug use: No      ROS: as noted in HPI  Objective:     BP (!) 128/56 (BP Location: Left Arm, Patient Position: Sitting, Cuff Size: Normal)   Pulse (!) 55   Resp 18   Ht 5' 6 (1.676 m)   Wt 203 lb 12 oz (92.4 kg)   SpO2 99%   BMI 32.89 kg/m  BP Readings from Last 3 Encounters:  03/30/24 (!) 128/56  01/09/24 (!) 112/50  11/04/23 117/61   Wt Readings from Last 3 Encounters:  03/30/24 203 lb 12 oz (92.4 kg)  01/09/24 201 lb 1.3 oz (91.2 kg)  11/04/23 213 lb 12 oz (97 kg)      Physical Exam Vitals and nursing note reviewed. Exam conducted with a chaperone present.  Constitutional:      General: She is not in acute distress.    Appearance: Normal appearance. She is not ill-appearing, toxic-appearing or diaphoretic.  HENT:     Head: Normocephalic and atraumatic.     Right Ear: Tympanic membrane, ear canal and external ear normal. There is no impacted cerumen.     Left Ear: Tympanic membrane, ear canal and external ear normal. There is no impacted cerumen.     Nose: Nose normal.     Mouth/Throat:     Mouth: Mucous membranes are moist.     Pharynx: Oropharynx is clear. No oropharyngeal exudate or posterior oropharyngeal erythema.  Eyes:     General: No scleral icterus.       Right eye: No discharge.        Left eye: No discharge.     Extraocular Movements: Extraocular movements intact.     Pupils: Pupils are equal, round, and reactive to light.  Neck:     Thyroid : No thyroid  mass, thyromegaly or thyroid  tenderness.  Cardiovascular:     Rate and Rhythm: Normal rate and regular rhythm.     Pulses: Normal pulses.     Heart sounds: No murmur heard. Pulmonary:     Effort: Pulmonary effort is normal. No respiratory distress.     Breath sounds: Normal breath sounds. No stridor. No wheezing or rhonchi.  Musculoskeletal:     Cervical back: Normal range of motion and  neck supple.     Right lower leg: No edema.     Left lower leg: No edema.  Lymphadenopathy:     Cervical: No cervical adenopathy.  Skin:    General: Skin is warm and dry.     Coloration: Skin is not jaundiced.     Findings: No bruising, erythema or rash.  Neurological:     General: No focal deficit present.     Mental Status: She is alert and oriented to person, place, and time.     Sensory: No sensory deficit.     Motor: No weakness.  Psychiatric:        Mood  and Affect: Mood normal.        Behavior: Behavior normal.      No results found for any visits on 03/30/24.  Last CBC Lab Results  Component Value Date   WBC 6.1 01/09/2024   HGB 14.3 01/09/2024   HCT 43.3 01/09/2024   MCV 93.9 01/09/2024   MCH 31.0 01/09/2024   RDW 12.6 01/09/2024   PLT 158 01/09/2024   Last metabolic panel Lab Results  Component Value Date   GLUCOSE 105 (H) 01/09/2024   NA 139 01/09/2024   K 4.5 01/09/2024   CL 101 01/09/2024   CO2 28 01/09/2024   BUN 19 01/09/2024   CREATININE 0.79 01/09/2024   GFRNONAA >60 01/09/2024   CALCIUM  10.6 (H) 01/09/2024   PROT 7.3 01/09/2024   ALBUMIN 4.7 01/09/2024   LABGLOB 2.2 10/10/2023   BILITOT 0.6 01/09/2024   ALKPHOS 77 01/09/2024   AST 20 01/09/2024   ALT 16 01/09/2024   ANIONGAP 10 01/09/2024   Last lipids Lab Results  Component Value Date   CHOL 165 01/08/2023   HDL 72 01/08/2023   LDLCALC 78 01/08/2023   TRIG 66 01/08/2023   CHOLHDL 2.3 01/08/2023   Last hemoglobin A1c Lab Results  Component Value Date   HGBA1C 5.7 (H) 10/10/2023   Last thyroid  functions No results found for: TSH, T3TOTAL, T4TOTAL, THYROIDAB Last vitamin D  Lab Results  Component Value Date   VD25OH 56 07/09/2022   Last vitamin B12 and Folate Lab Results  Component Value Date   VITAMINB12 529 04/08/2023   FOLATE 45.5 12/03/2019      The 10-year ASCVD risk score (Arnett DK, et al., 2019) is: 17.9%  Assessment & Plan:  Mixed hyperlipidemia -      Atorvastatin  Calcium ; Take 1 tablet (10 mg total) by mouth daily.  Dispense: 90 tablet; Refill: 3  Prediabetes -     CMP14+EGFR -     CBC with Differential/Platelet -     Hemoglobin A1c  History of breast cancer -     CBC with Differential/Platelet -     Lactate dehydrogenase -     Cancer antigen 27.29  Idiopathic peripheral neuropathy -     Gabapentin ; Take 1 capsule (300 mg total) by mouth at bedtime.  Dispense: 90 capsule; Refill: 3  Seizures (HCC)  Osteopenia, unspecified location  Assessment and Plan    Breast Cancer Completed anastrozole  treatment. Bone survey conducted. Calcium  levels to be rechecked. - Delete anastrozole  from medication list. - Recheck calcium  levels in 2-3 months. - Labs ordered per request of oncology  Osteopenia Managed with calcium  and vitamin D  supplementation. Supplements taken separately for optimal absorption. - Continue calcium  and vitamin D  supplementation as currently taken. - continue physical activity, weight bearing exercises would be beneficial  Peripheral Neuropathy Tingling in feet and hand pain managed with gabapentin . B12 levels normal. - Refill gabapentin  prescription.  Temporal Lobe Epilepsy Managed with Keppra , lacosamide , and Lamictal . Regular neurologist follow-ups. - Continue current epilepsy medication regimen. - Follow up with neurologist as scheduled.  Hyperlipidemia Managed with atorvastatin . - Continue atorvastatin  as prescribed.  General Health Maintenance Engages in regular physical activity. Lives independently. Encouraged resistance training. - Encourage resistance training exercises.  Follow-up Routine follow-up with no acute concerns. Blood work to be updated. - Order blood work as per cancer physician's orders. - Schedule follow-up visit in six months. - Use MyChart for lab results and communication.         Return in about 6  months (around 09/30/2024) for Annual Physical.   Benton LITTIE Gave,  PA

## 2024-03-30 NOTE — Patient Instructions (Signed)
 We drew your labs today and refilled your  medications.  Please return in 6 months for annual physical.

## 2024-03-31 ENCOUNTER — Ambulatory Visit: Payer: Self-pay | Admitting: Urgent Care

## 2024-03-31 LAB — CBC WITH DIFFERENTIAL/PLATELET
Basophils Absolute: 0.1 x10E3/uL (ref 0.0–0.2)
Basos: 1 %
EOS (ABSOLUTE): 0.1 x10E3/uL (ref 0.0–0.4)
Eos: 2 %
Hematocrit: 40.4 % (ref 34.0–46.6)
Hemoglobin: 13.1 g/dL (ref 11.1–15.9)
Immature Grans (Abs): 0 x10E3/uL (ref 0.0–0.1)
Immature Granulocytes: 0 %
Lymphocytes Absolute: 1.2 x10E3/uL (ref 0.7–3.1)
Lymphs: 27 %
MCH: 31 pg (ref 26.6–33.0)
MCHC: 32.4 g/dL (ref 31.5–35.7)
MCV: 96 fL (ref 79–97)
Monocytes Absolute: 0.4 x10E3/uL (ref 0.1–0.9)
Monocytes: 10 %
Neutrophils Absolute: 2.7 x10E3/uL (ref 1.4–7.0)
Neutrophils: 60 %
Platelets: 154 x10E3/uL (ref 150–450)
RBC: 4.22 x10E6/uL (ref 3.77–5.28)
RDW: 12.1 % (ref 11.7–15.4)
WBC: 4.5 x10E3/uL (ref 3.4–10.8)

## 2024-03-31 LAB — CMP14+EGFR
ALT: 17 IU/L (ref 0–32)
AST: 22 IU/L (ref 0–40)
Albumin: 4.4 g/dL (ref 3.8–4.8)
Alkaline Phosphatase: 93 IU/L (ref 44–121)
BUN/Creatinine Ratio: 14 (ref 12–28)
BUN: 11 mg/dL (ref 8–27)
Bilirubin Total: 0.3 mg/dL (ref 0.0–1.2)
CO2: 21 mmol/L (ref 20–29)
Calcium: 9.3 mg/dL (ref 8.7–10.3)
Chloride: 106 mmol/L (ref 96–106)
Creatinine, Ser: 0.81 mg/dL (ref 0.57–1.00)
Globulin, Total: 1.9 g/dL (ref 1.5–4.5)
Glucose: 90 mg/dL (ref 70–99)
Potassium: 4.4 mmol/L (ref 3.5–5.2)
Sodium: 143 mmol/L (ref 134–144)
Total Protein: 6.3 g/dL (ref 6.0–8.5)
eGFR: 75 mL/min/1.73 (ref >59)

## 2024-03-31 LAB — HEMOGLOBIN A1C
Est. average glucose Bld gHb Est-mCnc: 114 mg/dL
Hgb A1c MFr Bld: 5.6 % (ref 4.8–5.6)

## 2024-03-31 LAB — LACTATE DEHYDROGENASE: LDH: 202 IU/L (ref 119–226)

## 2024-03-31 LAB — CANCER ANTIGEN 27.29: CA 27.29: 29.4 U/mL (ref 0.0–38.6)

## 2024-04-06 DIAGNOSIS — G40109 Localization-related (focal) (partial) symptomatic epilepsy and epileptic syndromes with simple partial seizures, not intractable, without status epilepticus: Secondary | ICD-10-CM | POA: Diagnosis not present

## 2024-04-06 DIAGNOSIS — G629 Polyneuropathy, unspecified: Secondary | ICD-10-CM | POA: Diagnosis not present

## 2024-04-21 DIAGNOSIS — K080 Exfoliation of teeth due to systemic causes: Secondary | ICD-10-CM | POA: Diagnosis not present

## 2024-09-28 ENCOUNTER — Encounter: Admitting: Urgent Care

## 2024-10-14 ENCOUNTER — Encounter: Payer: Self-pay | Admitting: Urgent Care

## 2024-10-14 ENCOUNTER — Ambulatory Visit (INDEPENDENT_AMBULATORY_CARE_PROVIDER_SITE_OTHER): Admitting: Urgent Care

## 2024-10-14 VITALS — BP 110/76 | HR 57 | Ht 67.0 in | Wt 202.0 lb

## 2024-10-14 DIAGNOSIS — R7303 Prediabetes: Secondary | ICD-10-CM

## 2024-10-14 DIAGNOSIS — M8580 Other specified disorders of bone density and structure, unspecified site: Secondary | ICD-10-CM

## 2024-10-14 DIAGNOSIS — Z170 Estrogen receptor positive status [ER+]: Secondary | ICD-10-CM | POA: Diagnosis not present

## 2024-10-14 DIAGNOSIS — C50212 Malignant neoplasm of upper-inner quadrant of left female breast: Secondary | ICD-10-CM

## 2024-10-14 DIAGNOSIS — R569 Unspecified convulsions: Secondary | ICD-10-CM | POA: Diagnosis not present

## 2024-10-14 DIAGNOSIS — E782 Mixed hyperlipidemia: Secondary | ICD-10-CM | POA: Diagnosis not present

## 2024-10-14 DIAGNOSIS — Z23 Encounter for immunization: Secondary | ICD-10-CM

## 2024-10-14 DIAGNOSIS — Z0000 Encounter for general adult medical examination without abnormal findings: Secondary | ICD-10-CM | POA: Diagnosis not present

## 2024-10-14 NOTE — Patient Instructions (Signed)
 Please update Tdap vaccine at pharmacy. Needs to complete second shingles vaccine, however deferred today due to concurrent flu vaccine administration.

## 2024-10-14 NOTE — Progress Notes (Signed)
 "  Annual Wellness Visit     Patient: Darlene Moore, Female    DOB: 12/07/1945, 79 y.o.   MRN: [id number]  Subjective  Chief Complaint  Patient presents with   Annual Exam    Darlene Moore is a 79 y.o. female who presents today for her Annual Wellness Visit. She reports consuming a general diet. Pt walks daily, decreased recently due to a toe problem. She generally feels fairly well. She reports sleeping okay - in spurts. Takes an OTC med intermittently. She does have additional problems to discuss today.   HPI  Vision:Not within last year  and Dental: No current dental problems and Receives regular dental care  Discussed the use of AI scribe software for clinical note transcription with the patient, who gave verbal consent to proceed.  History of Present Illness   Darlene Moore is a 79 year old female with neuropathy who presents with foot pain and tingling.  She experiences neuropathy primarily affecting her left foot, characterized by tingling and soreness between the big toe and the adjacent toe due to rubbing. She uses Band-Aids to separate the toes to alleviate discomfort. No drainage is noted from the affected area. She takes gabapentin  twice daily, which effectively manages her neuropathic pain.  Recently, she sustained a muscle sprain in her leg while laying down a rug, which lasted about a week and a half. She used a heating pad and Motrin for relief, and the pain has since improved.  Her foot issues have recently limited her physical activity, although she generally engages in walking within her [address] [place] community, especially during warmer months. She avoids the exercise room due to a lack of company.  Her diet includes coffee, cookies, and a late meal around 2 or 3 PM. She consumes some junk food during card games but also eats mandarin oranges, apples, cottage cheese, yogurt, and takes a daily calcium  chew. Her weight has remained stable over the past  year.  She experiences sleep in spurts and takes a prescribed medication for sleepiness at night, which she believes is from her seizure doctor. She avoids taking it in the morning to prevent daytime drowsiness.       Patient Active Problem List   Diagnosis Date Noted   Osteopenia 02/23/2024   Post-menopausal 10/16/2023   Dizziness 10/10/2023   Acute pain of right knee 06/11/2023   Balance problem 06/11/2023   Hypotension 06/11/2023   Allergic rhinitis 05/06/2023   Seizures (HCC) 04/08/2023   Primary insomnia 04/08/2023   Prediabetes 01/08/2023   Idiopathic peripheral neuropathy 01/08/2023   Combined forms of age-related cataract of left eye 08/28/2022   Osteoporosis 06/12/2021   Age-related osteoporosis with current pathological fracture 06/12/2021   Encounter for Medicare annual wellness exam 11/20/2018   Family history of breast cancer    Family history of ovarian cancer    Family history of uterine cancer    Family history of stomach cancer    Family history of kidney cancer    Therapeutic drug monitoring 09/22/2018   Breast cancer of upper-inner quadrant of left female breast (HCC) 09/10/2018   Temporal lobe epilepsy (HCC) 07/28/2018   Closed nondisplaced fracture of sixth cervical vertebra with routine healing 09/18/2017   Dysfunction of eustachian tube 12/15/2013   Past Medical History:  Diagnosis Date   Cancer (HCC)    Concussion    Family history of breast cancer    Family history of kidney cancer    Family history  of ovarian cancer    Family history of stomach cancer    Family history of uterine cancer    High cholesterol    Medical history non-contributory    Seizures (HCC)    last seizure 03/18/20   Past Surgical History:  Procedure Laterality Date   CHOLECYSTECTOMY     COLONOSCOPY     COLONOSCOPY N/A 09/29/2015   Procedure: COLONOSCOPY;  Surgeon: Claudis RAYMOND Rivet, MD;  Location: [facility];  Service: Endoscopy;  Laterality: N/A;  730 - moved to 9:00 -  Ann to notify pt   PARTIAL MASTECTOMY WITH NEEDLE LOCALIZATION AND AXILLARY SENTINEL LYMPH NODE BX Left 10/01/2018   Procedure: LEFT BREAST PARTIAL MASTECTOMY WITH SENTINEL LYMPH NODE BIOPSY AFTER NEEDLE LOCALIZATION AND RADIOTRACER PLACEMENT;  Surgeon: Kallie Manuelita BROCKS, MD;  Location: [facility];  Service: General;  Laterality: Left;   Social History[1]    Medications: Show/hide medication list[2]  Allergies[3]  Patient Care Team: Lowella Benton CROME, [place] as PCP - General (Physician Assistant)  ROS Complete 12 point ROS performed with all pertinent positives listed in HPI     Objective  BP 110/76   Pulse (!) 57   Ht 5' 7 (1.702 m)   Wt 202 lb (91.6 kg)   SpO2 98%   BMI 31.64 kg/m  BP Readings from Last 3 Encounters:  10/14/24 110/76  03/30/24 (!) 128/56  01/09/24 (!) 112/50   Wt Readings from Last 3 Encounters:  10/14/24 202 lb (91.6 kg)  03/30/24 203 lb 12 oz (92.4 kg)  01/09/24 201 lb 1.3 oz (91.2 kg)      Physical Exam Vitals and nursing note reviewed. Exam conducted with a chaperone present.  Constitutional:      General: She is not in acute distress.    Appearance: Normal appearance. She is not ill-appearing, toxic-appearing or diaphoretic.  HENT:     Head: Normocephalic and atraumatic.     Right Ear: Tympanic membrane, ear canal and external ear normal. There is no impacted cerumen.     Left Ear: Tympanic membrane, ear canal and external ear normal. There is no impacted cerumen.     Nose: Nose normal.     Mouth/Throat:     Mouth: Mucous membranes are moist.     Pharynx: Oropharynx is clear. No oropharyngeal exudate or posterior oropharyngeal erythema.  Eyes:     General: No scleral icterus.       Right eye: No discharge.        Left eye: No discharge.     Extraocular Movements: Extraocular movements intact.     Pupils: Pupils are equal, round, and reactive to light.  Neck:     Thyroid : No thyroid  mass, thyromegaly or thyroid  tenderness.  Cardiovascular:      Rate and Rhythm: Normal rate and regular rhythm.     Pulses: Normal pulses.     Heart sounds: No murmur heard. Pulmonary:     Effort: Pulmonary effort is normal. No respiratory distress.     Breath sounds: Normal breath sounds. No stridor. No wheezing or rhonchi.  Abdominal:     General: Abdomen is flat. Bowel sounds are normal. There is no distension.     Palpations: Abdomen is soft. There is no mass.     Tenderness: There is no abdominal tenderness. There is no guarding.  Musculoskeletal:     Cervical back: Normal range of motion and neck supple. No rigidity or tenderness.     Right lower leg: No edema.  Left lower leg: No edema.  Feet:     Right foot:     Protective Sensation: 10 sites tested.  10 sites sensed.     Left foot:     Protective Sensation: 10 sites tested.  10 sites sensed.     Skin integrity: Erythema present.     Comments: Pt sensed all areas touched, but had a decreased sensation rather than lack of sensation. Slight rubbing between great toe and 2nd toe on L without ulceration or infection. Significant spider veins noted bilaterally Lymphadenopathy:     Cervical: No cervical adenopathy.  Skin:    General: Skin is warm and dry.     Coloration: Skin is not jaundiced.     Findings: No bruising, erythema or rash.  Neurological:     General: No focal deficit present.     Mental Status: She is alert and oriented to person, place, and time.     Sensory: No sensory deficit.     Motor: No weakness.  Psychiatric:        Mood and Affect: Mood normal.        Behavior: Behavior normal.     Most recent depression screenings:    10/14/2024    8:10 AM 10/16/2023    2:48 PM  PHQ 2/9 Scores  PHQ - 2 Score 0 0  PHQ- 9 Score 1     Visit info / Clinical Intake: Interpreter Needed?: No  Functional Status Activities of Daily Living (to include ambulation/medication): Independent Ambulation: Independent Medication Administration: Independent Home Management  (perform basic housework or laundry): Independent  Fall Screening Falls in the past year?: 0 Number of falls in past year: 0 Was there an injury with Fall?: 0 Fall Risk Category Calculator: 0 Patient Fall Risk Level: Low Fall Risk  Fall Risk Patient at Risk for Falls Due to: No Fall Risks Fall risk Follow up: Falls evaluation completed  Cognitive Assessment What year is it?: 0 points What month is it?: 0 points Give patient an address phrase to remember (5 components): [address] [place] About what time is it?: 0 points Count backwards from 20 to 1: 0 points Say the months of the year in reverse: 0 points Repeat the address phrase from earlier: 6 points 6 CIT Score: 6 points  Advance Directives (For Healthcare) Does Patient Have a Medical Advance Directive?: Yes Does patient want to make changes to medical advance directive?: No - Patient declined Type of Advance Directive: Healthcare Power of [place]; Living will Copy of Healthcare Power of Attorney in Chart?: No - copy requested Copy of Living Will in Chart?: No - copy requested    Vision/Hearing Screen: No results found.  Last CBC Lab Results  Component Value Date   WBC 4.5 03/30/2024   HGB 13.1 03/30/2024   HCT 40.4 03/30/2024   MCV 96 03/30/2024   MCH 31.0 03/30/2024   RDW 12.1 03/30/2024   PLT 154 03/30/2024   Last metabolic panel Lab Results  Component Value Date   GLUCOSE 90 03/30/2024   NA 143 03/30/2024   K 4.4 03/30/2024   CL 106 03/30/2024   CO2 21 03/30/2024   BUN 11 03/30/2024   CREATININE 0.81 03/30/2024   EGFR 75 03/30/2024   CALCIUM  9.3 03/30/2024   PROT 6.3 03/30/2024   ALBUMIN 4.4 03/30/2024   LABGLOB 1.9 03/30/2024   BILITOT 0.3 03/30/2024   ALKPHOS 93 03/30/2024   AST 22 03/30/2024   ALT 17 03/30/2024  ANIONGAP 10 01/09/2024   Last lipids Lab Results  Component Value Date   CHOL 165 01/08/2023   HDL 72 01/08/2023   LDLCALC 78 01/08/2023   TRIG 66 01/08/2023    CHOLHDL 2.3 01/08/2023   Last hemoglobin A1c Lab Results  Component Value Date   HGBA1C 5.6 03/30/2024   Last thyroid  functions No results found for: TSH, T3TOTAL, T4TOTAL, FREET4, THYROIDAB Last vitamin D  Lab Results  Component Value Date   VD25OH 56 07/09/2022   Last vitamin B12 and Folate Lab Results  Component Value Date   VITAMINB12 529 04/08/2023   FOLATE 45.5 12/03/2019      No results found for any visits on 10/14/24.    Assessment & Plan   Annual wellness visit done today including the all of the following: Reviewed patient's Family and Medical History Reviewed and updated list of patient's medical providers Assessment of cognitive impairment was done Assessed patient's functional ability Established a written schedule for health screening services Health Risk Assessent Completed and Reviewed  Exercise Activities and Dietary recommendations  Goals      Exercise 3x per week (30 min per time)     Continue walking daily on the sidewalk when weather permits.         Immunization History  Administered Date(s) Administered   Fluad Quad(high Dose 65+) 05/26/2019, 07/09/2022   Fluad Trivalent(High Dose 65+) 06/11/2023   INFLUENZA, HIGH DOSE SEASONAL PF 10/14/2024   PFIZER(Purple Top)SARS-COV-2 Vaccination 04/15/2020, 05/16/2020   PNEUMOCOCCAL CONJUGATE-20 07/31/2021, 07/09/2022    Health Maintenance  Topic Date Due   Hepatitis C Screening  Never done   DTaP/Tdap/Td (1 - Tdap) Never done   Zoster Vaccines- Shingrix (1 of 2) Never done   Medicare Annual Wellness (AWV)  10/15/2024   COVID-19 Vaccine (3 - Pfizer risk series) 10/30/2024 (Originally 06/13/2020)   Mammogram  02/18/2025   Pneumococcal Vaccine: 50+ Years  Completed   Influenza Vaccine  Completed   Bone Density Scan  Completed   Meningococcal B Vaccine  Aged Out   Colonoscopy  Discontinued     Discussed health benefits of physical activity, and encouraged her to engage in  regular exercise appropriate for her age and condition.    Problem List Items Addressed This Visit       Musculoskeletal and Integument   Osteopenia   Relevant Orders   Vitamin D  (25 hydroxy)     Other   Breast cancer of upper-inner quadrant of left female breast (HCC)   Seizures (HCC)   Prediabetes   Relevant Orders   HgB A1c   Other Visit Diagnoses       Routine adult health maintenance    -  Primary   Relevant Orders   CMP14+EGFR   CBC w/Diff/Platelet   TSH   Lipid panel   HgB A1c   Vitamin D  (25 hydroxy)     Immunization due       Relevant Orders   Flu vaccine HIGH DOSE PF(Fluzone Trivalent) (Completed)     Mixed hyperlipidemia       Relevant Orders   Lipid panel      Assessment and Plan    Routine physical exam Completed today along with labs Flu vaccine updated. Mammogram UTD, repeat dexa due in 2027  Peripheral neuropathy with foot pain Chronic neuropathy with tingling and soreness in left foot. Gabapentin  effective. Sensation decreased. Potential hammer toe development noted. - Continue gabapentin . - Use OTC gel pads to prevent toe friction. - Consider podiatry  referral if symptoms worsen.  Osteopenia Bone density scan confirmed osteopenia. Fracture risk discussed. - Continue calcium  and vitamin D . - Engage in weight-bearing physical activity. - defers bisphosphonate for now - Repeat bone density scan in 2027.  Mixed hyperlipidemia Managed with Lipitor. - Continue Lipitor.  Immunizations Due for flu vaccine and tetanus booster. Shingles vaccine recommended. - Administered flu vaccine. - Schedule tetanus booster at pharmacy. - Consider shingles vaccine.       Return in about 6 months (around 04/13/2025).     Benton LITTIE Gave, PA       [1]  Social History Tobacco Use   Smoking status: Never   Smokeless tobacco: Never  Vaping Use   Vaping status: Never Used  Substance Use Topics   Alcohol use: No   Drug use: No  [2]   Outpatient Medications Prior to Visit  Medication Sig   atorvastatin  (LIPITOR) 10 MG tablet Take 1 tablet (10 mg total) by mouth daily.   gabapentin  (NEURONTIN ) 300 MG capsule Take 1 capsule (300 mg total) by mouth at bedtime.   Lacosamide  150 MG TABS Take 1 tablet (150 mg total) by mouth in the morning and at bedtime.   lamoTRIgine  (LAMICTAL ) 25 MG tablet Take 2 tablets (50 mg total) by mouth 2 (two) times daily.   levETIRAcetam  (KEPPRA  XR) 500 MG 24 hr tablet Take 3 tablets (1,500 mg total) by mouth in the morning and at bedtime. WILL NEED APPOINTMENT FOR FURTHER REFILLS   Multiple Vitamin (MULTIVITAMIN) tablet Take 1 tablet by mouth daily.   Probiotic, Lactobacillus, CAPS Take by mouth.   VITAMIN D  PO Take 5,000 Units by mouth.   Wheat Dextrin (BENEFIBER PO) Take by mouth.   Calcium  Carbonate (CALCIUM  600 PO) Take by mouth 2 (two) times daily. (Patient not taking: Reported on 10/14/2024)   No facility-administered medications prior to visit.  [3] No Known Allergies  "

## 2024-10-15 ENCOUNTER — Ambulatory Visit: Payer: Self-pay | Admitting: Urgent Care

## 2024-10-15 LAB — CMP14+EGFR
ALT: 15 IU/L (ref 0–32)
AST: 26 IU/L (ref 0–40)
Albumin: 4.5 g/dL (ref 3.8–4.8)
Alkaline Phosphatase: 91 IU/L (ref 49–135)
BUN/Creatinine Ratio: 20 (ref 12–28)
BUN: 16 mg/dL (ref 8–27)
Bilirubin Total: 0.3 mg/dL (ref 0.0–1.2)
CO2: 19 mmol/L — ABNORMAL LOW (ref 20–29)
Calcium: 9.7 mg/dL (ref 8.7–10.3)
Chloride: 103 mmol/L (ref 96–106)
Creatinine, Ser: 0.82 mg/dL (ref 0.57–1.00)
Globulin, Total: 2.4 g/dL (ref 1.5–4.5)
Glucose: 96 mg/dL (ref 70–99)
Potassium: 4.6 mmol/L (ref 3.5–5.2)
Sodium: 140 mmol/L (ref 134–144)
Total Protein: 6.9 g/dL (ref 6.0–8.5)
eGFR: 74 mL/min/1.73

## 2024-10-15 LAB — CBC WITH DIFFERENTIAL/PLATELET
Basophils Absolute: 0.1 x10E3/uL (ref 0.0–0.2)
Basos: 1 %
EOS (ABSOLUTE): 0.1 x10E3/uL (ref 0.0–0.4)
Eos: 1 %
Hematocrit: 42.1 % (ref 34.0–46.6)
Hemoglobin: 13.6 g/dL (ref 11.1–15.9)
Immature Grans (Abs): 0 x10E3/uL (ref 0.0–0.1)
Immature Granulocytes: 0 %
Lymphocytes Absolute: 1.1 x10E3/uL (ref 0.7–3.1)
Lymphs: 24 %
MCH: 31.1 pg (ref 26.6–33.0)
MCHC: 32.3 g/dL (ref 31.5–35.7)
MCV: 96 fL (ref 79–97)
Monocytes Absolute: 0.4 x10E3/uL (ref 0.1–0.9)
Monocytes: 9 %
Neutrophils Absolute: 2.8 x10E3/uL (ref 1.4–7.0)
Neutrophils: 65 %
Platelets: 170 x10E3/uL (ref 150–450)
RBC: 4.38 x10E6/uL (ref 3.77–5.28)
RDW: 12.5 % (ref 11.7–15.4)
WBC: 4.4 x10E3/uL (ref 3.4–10.8)

## 2024-10-15 LAB — HEMOGLOBIN A1C
Est. average glucose Bld gHb Est-mCnc: 111 mg/dL
Hgb A1c MFr Bld: 5.5 % (ref 4.8–5.6)

## 2024-10-15 LAB — LIPID PANEL
Chol/HDL Ratio: 2.2 ratio (ref 0.0–4.4)
Cholesterol, Total: 152 mg/dL (ref 100–199)
HDL: 68 mg/dL
LDL Chol Calc (NIH): 68 mg/dL (ref 0–99)
Triglycerides: 87 mg/dL (ref 0–149)
VLDL Cholesterol Cal: 16 mg/dL (ref 5–40)

## 2024-10-15 LAB — TSH: TSH: 3.71 u[IU]/mL (ref 0.450–4.500)

## 2024-10-15 LAB — VITAMIN D 25 HYDROXY (VIT D DEFICIENCY, FRACTURES): Vit D, 25-Hydroxy: 39.6 ng/mL (ref 30.0–100.0)

## 2025-01-06 ENCOUNTER — Ambulatory Visit: Admitting: Medical Oncology

## 2025-01-06 ENCOUNTER — Inpatient Hospital Stay

## 2025-03-30 ENCOUNTER — Ambulatory Visit: Admitting: Urgent Care
